# Patient Record
Sex: Female | Born: 1935 | Race: White | Hispanic: No | Smoking: Never smoker
Health system: Southern US, Community
[De-identification: ages and names within clinical notes are randomized; demographics above are authoritative.]

## PROBLEM LIST (undated history)

## (undated) DIAGNOSIS — C801 Malignant (primary) neoplasm, unspecified: Secondary | ICD-10-CM

## (undated) DIAGNOSIS — C8370 Burkitt lymphoma, unspecified site: Secondary | ICD-10-CM

## (undated) DIAGNOSIS — I639 Cerebral infarction, unspecified: Secondary | ICD-10-CM

## (undated) DIAGNOSIS — C719 Malignant neoplasm of brain, unspecified: Secondary | ICD-10-CM

## (undated) DIAGNOSIS — T7840XA Allergy, unspecified, initial encounter: Secondary | ICD-10-CM

## (undated) HISTORY — PX: ABDOMINAL HYSTERECTOMY: SHX81

## (undated) HISTORY — PX: TONSILLECTOMY: SUR1361

## (undated) HISTORY — PX: CHOLECYSTECTOMY: SHX55

## (undated) HISTORY — PX: ABDOMINAL SURGERY: SHX537

---

## 2005-08-04 ENCOUNTER — Ambulatory Visit: Payer: Self-pay | Admitting: Family Medicine

## 2005-09-23 ENCOUNTER — Ambulatory Visit: Payer: Self-pay | Admitting: Family Medicine

## 2005-11-06 ENCOUNTER — Ambulatory Visit: Payer: Self-pay | Admitting: Family Medicine

## 2005-12-28 ENCOUNTER — Ambulatory Visit: Payer: Self-pay | Admitting: Family Medicine

## 2006-01-04 ENCOUNTER — Ambulatory Visit: Payer: Self-pay | Admitting: Family Medicine

## 2015-08-18 ENCOUNTER — Encounter (HOSPITAL_COMMUNITY): Payer: Self-pay | Admitting: Emergency Medicine

## 2015-08-18 ENCOUNTER — Emergency Department (HOSPITAL_COMMUNITY): Payer: Medicare Other

## 2015-08-18 ENCOUNTER — Inpatient Hospital Stay (HOSPITAL_COMMUNITY)
Admission: EM | Admit: 2015-08-18 | Discharge: 2015-08-31 | DRG: 824 | Disposition: A | Discharge status: Home Health | Payer: Medicare Other | Attending: Internal Medicine | Admitting: Internal Medicine

## 2015-08-18 DIAGNOSIS — K219 Gastro-esophageal reflux disease without esophagitis: Secondary | ICD-10-CM | POA: Diagnosis present

## 2015-08-18 DIAGNOSIS — R1030 Lower abdominal pain, unspecified: Secondary | ICD-10-CM

## 2015-08-18 DIAGNOSIS — R079 Chest pain, unspecified: Secondary | ICD-10-CM

## 2015-08-18 DIAGNOSIS — R519 Headache, unspecified: Secondary | ICD-10-CM | POA: Insufficient documentation

## 2015-08-18 DIAGNOSIS — Z903 Acquired absence of stomach [part of]: Secondary | ICD-10-CM

## 2015-08-18 DIAGNOSIS — E875 Hyperkalemia: Secondary | ICD-10-CM | POA: Insufficient documentation

## 2015-08-18 DIAGNOSIS — R0602 Shortness of breath: Secondary | ICD-10-CM

## 2015-08-18 DIAGNOSIS — E538 Deficiency of other specified B group vitamins: Secondary | ICD-10-CM | POA: Diagnosis present

## 2015-08-18 DIAGNOSIS — D6959 Other secondary thrombocytopenia: Secondary | ICD-10-CM | POA: Diagnosis present

## 2015-08-18 DIAGNOSIS — G96198 Other disorders of meninges, not elsewhere classified: Secondary | ICD-10-CM

## 2015-08-18 DIAGNOSIS — C8590 Non-Hodgkin lymphoma, unspecified, unspecified site: Secondary | ICD-10-CM

## 2015-08-18 DIAGNOSIS — N179 Acute kidney failure, unspecified: Secondary | ICD-10-CM

## 2015-08-18 DIAGNOSIS — Z5111 Encounter for antineoplastic chemotherapy: Secondary | ICD-10-CM | POA: Insufficient documentation

## 2015-08-18 DIAGNOSIS — R634 Abnormal weight loss: Secondary | ICD-10-CM | POA: Diagnosis present

## 2015-08-18 DIAGNOSIS — G039 Meningitis, unspecified: Secondary | ICD-10-CM

## 2015-08-18 DIAGNOSIS — K5900 Constipation, unspecified: Secondary | ICD-10-CM | POA: Insufficient documentation

## 2015-08-18 DIAGNOSIS — Z87891 Personal history of nicotine dependence: Secondary | ICD-10-CM

## 2015-08-18 DIAGNOSIS — K5909 Other constipation: Secondary | ICD-10-CM

## 2015-08-18 DIAGNOSIS — Z79899 Other long term (current) drug therapy: Secondary | ICD-10-CM

## 2015-08-18 DIAGNOSIS — R200 Anesthesia of skin: Secondary | ICD-10-CM

## 2015-08-18 DIAGNOSIS — C8378 Burkitt lymphoma, lymph nodes of multiple sites: Principal | ICD-10-CM | POA: Insufficient documentation

## 2015-08-18 DIAGNOSIS — M791 Myalgia, unspecified site: Secondary | ICD-10-CM

## 2015-08-18 DIAGNOSIS — E860 Dehydration: Secondary | ICD-10-CM

## 2015-08-18 DIAGNOSIS — R61 Generalized hyperhidrosis: Secondary | ICD-10-CM | POA: Diagnosis present

## 2015-08-18 DIAGNOSIS — D6489 Other specified anemias: Secondary | ICD-10-CM | POA: Insufficient documentation

## 2015-08-18 DIAGNOSIS — Z8711 Personal history of peptic ulcer disease: Secondary | ICD-10-CM

## 2015-08-18 DIAGNOSIS — E876 Hypokalemia: Secondary | ICD-10-CM | POA: Diagnosis present

## 2015-08-18 DIAGNOSIS — D61818 Other pancytopenia: Secondary | ICD-10-CM | POA: Diagnosis present

## 2015-08-18 DIAGNOSIS — D649 Anemia, unspecified: Secondary | ICD-10-CM

## 2015-08-18 DIAGNOSIS — R531 Weakness: Secondary | ICD-10-CM

## 2015-08-18 DIAGNOSIS — R112 Nausea with vomiting, unspecified: Secondary | ICD-10-CM

## 2015-08-18 DIAGNOSIS — Z8541 Personal history of malignant neoplasm of cervix uteri: Secondary | ICD-10-CM

## 2015-08-18 DIAGNOSIS — E559 Vitamin D deficiency, unspecified: Secondary | ICD-10-CM | POA: Diagnosis present

## 2015-08-18 DIAGNOSIS — C7949 Secondary malignant neoplasm of other parts of nervous system: Secondary | ICD-10-CM

## 2015-08-18 DIAGNOSIS — Z9049 Acquired absence of other specified parts of digestive tract: Secondary | ICD-10-CM

## 2015-08-18 DIAGNOSIS — T380X5A Adverse effect of glucocorticoids and synthetic analogues, initial encounter: Secondary | ICD-10-CM | POA: Diagnosis present

## 2015-08-18 DIAGNOSIS — K1379 Other lesions of oral mucosa: Secondary | ICD-10-CM

## 2015-08-18 DIAGNOSIS — Z8719 Personal history of other diseases of the digestive system: Secondary | ICD-10-CM

## 2015-08-18 DIAGNOSIS — D696 Thrombocytopenia, unspecified: Secondary | ICD-10-CM | POA: Diagnosis present

## 2015-08-18 DIAGNOSIS — C8370 Burkitt lymphoma, unspecified site: Secondary | ICD-10-CM

## 2015-08-18 DIAGNOSIS — R51 Headache: Secondary | ICD-10-CM

## 2015-08-18 DIAGNOSIS — Z9189 Other specified personal risk factors, not elsewhere classified: Secondary | ICD-10-CM | POA: Insufficient documentation

## 2015-08-18 DIAGNOSIS — R590 Localized enlarged lymph nodes: Secondary | ICD-10-CM

## 2015-08-18 DIAGNOSIS — D801 Nonfamilial hypogammaglobulinemia: Secondary | ICD-10-CM | POA: Diagnosis present

## 2015-08-18 HISTORY — DX: Malignant (primary) neoplasm, unspecified: C80.1

## 2015-08-18 LAB — URINALYSIS, ROUTINE W REFLEX MICROSCOPIC
BILIRUBIN URINE: NEGATIVE
GLUCOSE, UA: NEGATIVE mg/dL
Ketones, ur: 15 mg/dL — AB
Leukocytes, UA: NEGATIVE
NITRITE: NEGATIVE
PH: 6 (ref 5.0–8.0)
Protein, ur: NEGATIVE mg/dL
SPECIFIC GRAVITY, URINE: 1.015 (ref 1.005–1.030)
Urobilinogen, UA: 0.2 mg/dL (ref 0.0–1.0)

## 2015-08-18 LAB — SAVE SMEAR

## 2015-08-18 LAB — SEDIMENTATION RATE: Sed Rate: 55 mm/hr — ABNORMAL HIGH (ref 0–22)

## 2015-08-18 LAB — COMPREHENSIVE METABOLIC PANEL
ALT: 16 U/L (ref 14–54)
AST: 69 U/L — AB (ref 15–41)
Albumin: 3.2 g/dL — ABNORMAL LOW (ref 3.5–5.0)
Alkaline Phosphatase: 95 U/L (ref 38–126)
Anion gap: 15 (ref 5–15)
BILIRUBIN TOTAL: 0.8 mg/dL (ref 0.3–1.2)
BUN: 13 mg/dL (ref 6–20)
CALCIUM: 9 mg/dL (ref 8.9–10.3)
CO2: 22 mmol/L (ref 22–32)
CREATININE: 1.3 mg/dL — AB (ref 0.44–1.00)
Chloride: 100 mmol/L — ABNORMAL LOW (ref 101–111)
GFR, EST AFRICAN AMERICAN: 44 mL/min — AB (ref >60)
GFR, EST NON AFRICAN AMERICAN: 38 mL/min — AB (ref >60)
Glucose, Bld: 88 mg/dL (ref 65–99)
Potassium: 3.1 mmol/L — ABNORMAL LOW (ref 3.5–5.1)
Sodium: 137 mmol/L (ref 135–145)
TOTAL PROTEIN: 5.7 g/dL — AB (ref 6.5–8.1)

## 2015-08-18 LAB — CBC WITH DIFFERENTIAL/PLATELET
BASOS PCT: 1 %
Basophils Absolute: 0.1 10*3/uL (ref 0.0–0.1)
EOS PCT: 2 %
Eosinophils Absolute: 0.1 10*3/uL (ref 0.0–0.7)
HEMATOCRIT: 38 % (ref 36.0–46.0)
Hemoglobin: 12.6 g/dL (ref 12.0–15.0)
Lymphocytes Relative: 28 %
Lymphs Abs: 2 10*3/uL (ref 0.7–4.0)
MCH: 29 pg (ref 26.0–34.0)
MCHC: 33.2 g/dL (ref 30.0–36.0)
MCV: 87.4 fL (ref 78.0–100.0)
MONO ABS: 0.8 10*3/uL (ref 0.1–1.0)
Monocytes Relative: 12 %
NEUTROS ABS: 4 10*3/uL (ref 1.7–7.7)
NEUTROS PCT: 57 %
Platelets: 34 10*3/uL — ABNORMAL LOW (ref 150–400)
RBC: 4.35 MIL/uL (ref 3.87–5.11)
RDW: 16.6 % — ABNORMAL HIGH (ref 11.5–15.5)
WBC: 7 10*3/uL (ref 4.0–10.5)

## 2015-08-18 LAB — LIPASE, BLOOD: LIPASE: 23 U/L (ref 22–51)

## 2015-08-18 LAB — MAGNESIUM: Magnesium: 1.7 mg/dL (ref 1.7–2.4)

## 2015-08-18 LAB — TYPE AND SCREEN
ABO/RH(D): O POS
ANTIBODY SCREEN: NEGATIVE

## 2015-08-18 LAB — PROTIME-INR
INR: 1.15 (ref 0.00–1.49)
Prothrombin Time: 14.8 seconds (ref 11.6–15.2)

## 2015-08-18 LAB — I-STAT TROPONIN, ED: Troponin i, poc: 0 ng/mL (ref 0.00–0.08)

## 2015-08-18 LAB — URINE MICROSCOPIC-ADD ON

## 2015-08-18 LAB — PLATELET COUNT: PLATELETS: 31 10*3/uL — AB (ref 150–400)

## 2015-08-18 LAB — APTT: aPTT: 33 seconds (ref 24–37)

## 2015-08-18 MED ORDER — SODIUM CHLORIDE 0.9 % IV SOLN
Freq: Once | INTRAVENOUS | Status: AC
  Administered 2015-08-18: via INTRAVENOUS

## 2015-08-18 MED ORDER — PANTOPRAZOLE SODIUM 40 MG PO TBEC
40.0000 mg | DELAYED_RELEASE_TABLET | Freq: Every day | ORAL | Status: DC
  Administered 2015-08-18 – 2015-08-31 (×13): 40 mg via ORAL
  Filled 2015-08-18 (×13): qty 1

## 2015-08-18 MED ORDER — SODIUM CHLORIDE 0.9 % IV BOLUS (SEPSIS)
1000.0000 mL | Freq: Once | INTRAVENOUS | Status: AC
  Administered 2015-08-18: 1000 mL via INTRAVENOUS

## 2015-08-18 MED ORDER — VITAMIN D (ERGOCALCIFEROL) 1.25 MG (50000 UNIT) PO CAPS
50000.0000 [IU] | ORAL_CAPSULE | ORAL | Status: DC
  Administered 2015-08-27: 50000 [IU] via ORAL
  Filled 2015-08-18 (×2): qty 1

## 2015-08-18 MED ORDER — PNEUMOCOCCAL VAC POLYVALENT 25 MCG/0.5ML IJ INJ
0.5000 mL | INJECTION | INTRAMUSCULAR | Status: AC
  Administered 2015-08-20: 0.5 mL via INTRAMUSCULAR
  Filled 2015-08-18: qty 0.5

## 2015-08-18 MED ORDER — IOHEXOL 300 MG/ML  SOLN
25.0000 mL | Freq: Once | INTRAMUSCULAR | Status: DC | PRN
  Administered 2015-08-18: 25 mL via ORAL
  Filled 2015-08-18: qty 30

## 2015-08-18 MED ORDER — INFLUENZA VAC SPLIT QUAD 0.5 ML IM SUSY
0.5000 mL | PREFILLED_SYRINGE | INTRAMUSCULAR | Status: AC
  Administered 2015-08-20: 0.5 mL via INTRAMUSCULAR
  Filled 2015-08-18: qty 0.5

## 2015-08-18 MED ORDER — SODIUM CHLORIDE 0.9 % IV SOLN
INTRAVENOUS | Status: AC
  Administered 2015-08-18: 22:00:00 via INTRAVENOUS

## 2015-08-18 MED ORDER — IOHEXOL 300 MG/ML  SOLN
100.0000 mL | Freq: Once | INTRAMUSCULAR | Status: AC | PRN
  Administered 2015-08-18: 100 mL via INTRAVENOUS

## 2015-08-18 MED ORDER — POTASSIUM CHLORIDE CRYS ER 20 MEQ PO TBCR
60.0000 meq | EXTENDED_RELEASE_TABLET | Freq: Once | ORAL | Status: AC
  Administered 2015-08-18: 60 meq via ORAL
  Filled 2015-08-18: qty 3

## 2015-08-18 MED ORDER — TRAMADOL HCL 50 MG PO TABS
50.0000 mg | ORAL_TABLET | Freq: Four times a day (QID) | ORAL | Status: DC | PRN
  Administered 2015-08-19 – 2015-08-20 (×2): 50 mg via ORAL
  Filled 2015-08-18 (×2): qty 1

## 2015-08-18 MED ORDER — ONDANSETRON HCL 4 MG/2ML IJ SOLN
4.0000 mg | Freq: Once | INTRAMUSCULAR | Status: AC
  Administered 2015-08-18: 4 mg via INTRAVENOUS
  Filled 2015-08-18: qty 2

## 2015-08-18 NOTE — ED Provider Notes (Signed)
Medical screening examination/treatment/procedure(s) were conducted as a shared visit with non-physician practitioner(s) and myself.  I personally evaluated the patient during the encounter.  1 month of generalized weakness, weight loss and chin numbness. Multiple other symptoms on ROS. Exam benign. Abdomen benign. Lungs normal. Heart normal. VSS. 2/2 weight loss, anorexia, fatigue and intermittent abdominal pain we will do a CT scan of abdomen/pelvis to eval for malignancy, obstruction or diverticulitis. If normal can be dc.    EKG Interpretation   Date/Time:  Sunday August 18 2015 15:19:55 EDT Ventricular Rate:  68 PR Interval:  168 QRS Duration: 80 QT Interval:  439 QTC Calculation: 467 R Axis:   -5 Text Interpretation:  Sinus rhythm Probable anteroseptal infarct, old No  previous ECGs available Confirmed by [facility] MD, Denee Boeder [phone]) on 08/18/2015  5:00:08 PM        Merrily Pew, MD 08/21/15 2230

## 2015-08-18 NOTE — ED Provider Notes (Signed)
CSN: [id number]     Arrival date & time 08/18/15  1329 History   First MD Initiated Contact with Patient 08/18/15 1348     Chief Complaint  Patient presents with  . Generalized Body Aches  . Numbness     (Consider location/radiation/quality/duration/timing/severity/associated sxs/prior Treatment) HPI Comments: Kristen Baker is a 81 y.o. female with a PMHx of remote cervical cancer in her 61s, GERD, and vitamin D deficiency, with a PSHx of cholecystectomy, tonsillectomy, abd hysterectomy, and colonic resection, who presents to the ED with multiple complaints. She reports that for the last month she has been having intermittent generalized body aches, lower abdominal pain, nausea, vomiting, headaches, chest pain, and shortness breath with exertion, and constant numbness in her chin. She describes her lower abdominal pain is 10/10 intermittent sharp shooting pain from her lower abdomen radiating to her back with no known aggravating factors and alleviated with Aleve. She took Aleve just prior to arrival. She states that currently her abdominal pain is resolved as well as her headaches, myalgias, and CP/SOB. She reports that she has some nausea which is ongoing, and the last time she vomited was on Friday and this was nonbloody nonbilious. The only symptom currently present is the nausea and the constant numbness in her chin which has not changed since onset. She reports that all of her symptoms seem to start after she had an iron infusion in August. She reports that her primary care doctor has seen her for these symptoms and she had B-12 shots in the past for it.   She denies any medical history aside from GERD and vitamin D deficiency as well as the remote cervical cancer history. She denies any fevers, chills, shortness of breath at rest, cough, leg swelling, recent travel/surgery/immobilization, history of DVT/PE, estrogen use, diarrhea, constipation, melena, hematochezia, obstipation, hematemesis,  dysuria, hematuria, weakness, vision changes, syncope, lightheadedness, or diaphoresis. She is a nonsmoker.  PCP is Dr. Cathi Roan at [facility] in [place].  Patient is a 81 y.o. female presenting with abdominal pain. The history is provided by the patient. No language interpreter was used.  Abdominal Pain Pain location:  LLQ and RLQ Pain quality: sharp   Pain radiates to:  Back Pain severity:  Severe Onset quality:  Gradual Duration:  4 weeks Timing:  Intermittent Progression:  Partially resolved Chronicity:  New Context: not recent travel, not sick contacts and not suspicious food intake   Relieved by:  NSAIDs Worsened by:  Nothing tried Ineffective treatments:  None tried Associated symptoms: chest pain (intermittently, now resolved), nausea, shortness of breath (only with exertion, currently resolved) and vomiting (last episode on Friday)   Associated symptoms: no chills, no constipation, no cough, no diarrhea, no dysuria, no fever, no flatus, no hematemesis, no hematochezia, no hematuria and no melena   Risk factors: NSAID use     Past Medical History  Diagnosis Date  . Cancer [facility])     cervical cancer   Past Surgical History  Procedure Laterality Date  . Cholecystectomy    . Tonsillectomy    . Abdominal hysterectomy    . Abdominal surgery      2-3 rd of colon removed   No family history on file. Social History  Substance Use Topics  . Smoking status: Never Smoker   . Smokeless tobacco: None  . Alcohol Use: No   OB History    No data available     Review of Systems  Constitutional: Negative for fever, chills and diaphoresis.  Eyes: Negative for visual disturbance.  Respiratory: Positive for shortness of breath (only with exertion, currently resolved). Negative for cough.   Cardiovascular: Positive for chest pain (intermittently, now resolved). Negative for leg swelling.  Gastrointestinal: Positive for nausea, vomiting (last episode on Friday) and  abdominal pain (intermittent). Negative for diarrhea, constipation, blood in stool, melena, hematochezia, flatus and hematemesis.  Genitourinary: Negative for dysuria and hematuria.  Musculoskeletal: Positive for myalgias (generalized body aches). Negative for arthralgias.  Skin: Negative for color change and rash.  Allergic/Immunologic: Negative for immunocompromised state.  Neurological: Positive for numbness (to chin, constantly) and headaches (intermittent, currently resolved). Negative for weakness and light-headedness.  Hematological: Does not bruise/bleed easily.  Psychiatric/Behavioral: Negative for confusion.   10 Systems reviewed and are negative for acute change except as noted in the HPI.    Allergies  Sulfa antibiotics  Home Medications   Prior to Admission medications   Not on File   BP 155/60 mmHg  Pulse 80  Temp(Src) 98.1 F (36.7 C) (Oral)  Resp 16  Ht 5\' 6"  (1.676 m)  Wt 174 lb 14.4 oz (79.334 kg)  BMI 28.24 kg/m2  SpO2 95% Physical Exam  Constitutional: She is oriented to person, place, and time. Vital signs are normal. She appears well-developed and well-nourished.  Non-toxic appearance. No distress.  Afebrile, nontoxic, NAD  HENT:  Head: Normocephalic and atraumatic.  Mouth/Throat: Oropharynx is clear and moist and mucous membranes are normal.  Eyes: Conjunctivae and EOM are normal. Pupils are equal, round, and reactive to light. Right eye exhibits no discharge. Left eye exhibits no discharge.  PERRL, EOMI (chronic baseline lateral deviation of R eye), no nystagmus, no visual field deficits   Neck: Normal range of motion. Neck supple.  Cardiovascular: Normal rate, regular rhythm, normal heart sounds and intact distal pulses.  Exam reveals no gallop and no friction rub.   No murmur heard. RRR, nl s1/s2, no m/r/g, distal pulses intact, no pedal edema   Pulmonary/Chest: Effort normal and breath sounds normal. No respiratory distress. She has no decreased  breath sounds. She has no wheezes. She has no rhonchi. She has no rales. She exhibits no tenderness, no crepitus, no deformity and no retraction.  CTAB in all lung fields, no w/r/r, no hypoxia or increased WOB, speaking in full sentences, SpO2 95% on RA No chest wall TTP, crepitus, deformity, or retractions.  Abdominal: Soft. Normal appearance and bowel sounds are normal. She exhibits no distension. There is tenderness in the suprapubic area. There is no rigidity, no rebound, no guarding, no CVA tenderness, no tenderness at McBurney's point and negative Murphy's sign.    Soft, nondistended, +BS throughout, with mild TTP only with very deep palpation to the suprapubic area, no r/g/r, neg murphy's, neg mcburney's, no CVA TTP   Musculoskeletal: Normal range of motion.  MAE x4 Strength and sensation grossly intact Distal pulses intact No pedal edema, neg homan's bilaterally   Neurological: She is alert and oriented to person, place, and time. She has normal strength. A sensory deficit (somewhat diminished sensation to chin, no other deficits) is present. No cranial nerve deficit. Coordination and gait normal. GCS eye subscore is 4. GCS verbal subscore is 5. GCS motor subscore is 6.  CN 2-12 grossly intact aside from mildly diminished sensation to chin, no facial asymmetry A&O x4 GCS 15 Sensation and strength intact elsewhere Gait nonataxic Coordination with finger-to-nose WNL Neg pronator drift   Skin: Skin is warm, dry and intact. No rash noted.  Psychiatric:  She has a normal mood and affect.  Nursing note and vitals reviewed.   ED Course  Procedures (including critical care time) Labs Review Labs Reviewed  URINALYSIS, ROUTINE W REFLEX MICROSCOPIC (NOT AT [facility]) - Abnormal; Notable for the following:    Hgb urine dipstick TRACE (*)    Ketones, ur 15 (*)    All other components within normal limits  CBC WITH DIFFERENTIAL/PLATELET - Abnormal; Notable for the following:    RDW 16.6 (*)     Platelets 34 (*)    All other components within normal limits  COMPREHENSIVE METABOLIC PANEL - Abnormal; Notable for the following:    Potassium 3.1 (*)    Chloride 100 (*)    Creatinine, Ser 1.30 (*)    Total Protein 5.7 (*)    Albumin 3.2 (*)    AST 69 (*)    GFR calc non Af Amer 38 (*)    GFR calc Af Amer 44 (*)    All other components within normal limits  PLATELET COUNT - Abnormal; Notable for the following:    Platelets 31 (*)    All other components within normal limits  LIPASE, BLOOD  URINE MICROSCOPIC-ADD ON  PROTIME-INR  APTT  I-STAT TROPOININ, ED    Imaging Review Ct Abdomen Pelvis W Contrast  08/18/2015   CLINICAL DATA:  81 year old female with lower abdominal pain nausea and vomiting for 1 month. Initial encounter.  EXAM: CT ABDOMEN AND PELVIS WITH CONTRAST  TECHNIQUE: Multidetector CT imaging of the abdomen and pelvis was performed using the standard protocol following bolus administration of intravenous contrast.  CONTRAST:  60mL OMNIPAQUE IOHEXOL 300 MG/ML SOLN, 139mL OMNIPAQUE IOHEXOL 300 MG/ML SOLN  COMPARISON:  [facility] Chest CTA 11/20/2012. Acute abdominal series from today  FINDINGS: Mild respiratory motion artifact at the lung bases. There are chronic surgical clips about the distal thoracic esophagus and gastroesophageal junction. New since 2014 in the posterior mediastinum and tracking toward the retrocrural space is abnormal confluent soft tissue anterior to the thoracic spine and inseparable from the medial wall of the descending thoracic aorta measuring 3 x 44 x 54 mm (AP by transverse by CC). This seems to be separate from both the aorta and esophagus. There is no anterior thoracic spine erosion. Calcified plaque along this segment of the aorta.  No associated pericardial or pleural effusion. Mild bronchiectasis at both lung bases with no confluent pulmonary opacity.  Degenerative changes in the spine. Chronic 10 mm lucent area in the left T12 vertebral  body is stable and most resembled hemangioma in 2014. No acute or suspicious osseous lesion is identified however, there is subtle increased ventral epidural soft tissue seen in the sacrum -sagittal image 73.  Mild to moderate nonspecific presacral stranding. No pelvic free fluid. Negative rectum with retained stool. Uterus surgically absent. Adnexa within normal limits. Numerous pelvic phleboliths. Negative urinary bladder.  Redundant sigmoid colon. Proximal sigmoid and left colon diverticulosis with no active inflammation identified. Negative transverse colon. Negative right colon. Ileocecal valve lipoma incidentally noted. Appendix diminutive or absent. Negative terminal ileum. No dilated or abnormal small bowel loops. Occasional surgical clips in the greater omentum. Diminutive stomach. Duodenum within normal limits.  Major arterial structures are patent in the abdomen and pelvis with fairly extensive calcified aortic atherosclerosis. Portal venous system appears to be patent.  Retroperitoneal lymphadenopathy maximal at the lower lumbar spine level anterior to the IVC measuring up to 22 mm short axis. Numerous increased para renal and other retroperitoneal space  nodes are individually up to 14 mm short axis. Mesenteric nodes in the abdomen and pelvis have a more normal appearance. No pelvic sidewall or inguinal lymphadenopathy identified.  Solitary small nonspecific low-density area in the right hepatic lobe measuring 10 mm on series 2, image 20, favor benign. Gallbladder not identified and felt to be surgically absent. No splenomegaly or splenic lesion. Negative pancreas and adrenal glands. Bilateral renal enhancement and contrast excretion within normal limits. No abdominal free fluid.  IMPRESSION: 1. Retroperitoneal and lower posterior mediastinal / retrocrural soft tissue masses most compatible with lymphadenopathy, and most suggestive of Lymphoma. Some of these might be amenable to CT-guided biopsy,  uncertain. 2. Subtle increased sacral epidural soft tissue, but no destructive osseous lesion identified. Nonspecific presacral stranding. Metastatic disease to the spine not excluded.   Electronically Signed   By: Genevie Ann M.D.   On: 08/18/2015 18:52   Dg Abd Acute W/chest  08/18/2015   CLINICAL DATA:  Chest pain and abdominal pain.  EXAM: DG ABDOMEN ACUTE W/ 1V CHEST  COMPARISON:  Chest x-ray dated 12/12/2012 performed at [place]: Heart size and pulmonary vascularity are normal and the lungs are clear. Surgical clips at the gastroesophageal junction. Slight thoracolumbar scoliosis.  No free air or free fluid in the abdomen. Bowel gas pattern is normal. Surgical clips and staples in the abdomen. Multiple phleboliths in the pelvis. No acute osseous abnormality.  IMPRESSION: Negative abdominal radiographs.  No acute cardiopulmonary disease.   Electronically Signed   By: Lorriane Shire M.D.   On: 08/18/2015 17:17   I have personally reviewed and evaluated these images and lab results as part of my medical decision-making.   EKG Interpretation   Date/Time:  Sunday August 18 2015 15:19:55 EDT Ventricular Rate:  68 PR Interval:  168 QRS Duration: 80 QT Interval:  439 QTC Calculation: 467 R Axis:   -5 Text Interpretation:  Sinus rhythm Probable anteroseptal infarct, old No  previous ECGs available Confirmed by [facility] MD, Corene Cornea [phone]) on 08/18/2015  5:00:08 PM      MDM   Final diagnoses:  Myalgia  Non-intractable vomiting with nausea, vomiting of unspecified type  Lower abdominal pain  Numbness of face  SOB (shortness of breath) on exertion  Intermittent chest pain  Frequent headaches  Thrombocytopenia (HCC)  Hypokalemia  LAD (lymphadenopathy), retroperitoneal    81 y.o. female here with intermittent abd pain, n/v, headaches, CP, SOB with exertion, and constant numbness in chin x1 month. Has seen her PCP who worked her up, but nothing in the chart regarding this. No  baseline labs on file. On exam, mild suprapubic tenderness with very deep palpation, nonperitoneal, clear lung exam, no chest wall TTP, no hypoxia or tachycardia, no pedal edema, no focal neuro deficits aside from diminished sensation to chin. No rashes. Difficult to determine exact etiology of symptoms, will get basic labs and acute abd series to start, and after we have Cr value could consider abd CT scan to eval her abd pain. Will give zofran and fluids, pt declines pain meds stating she just took aleve PTA and "has no pain right now". Will reassess shortly.   5:51 PM U/A clear, trop neg, EKG without concerning acute ischemic changes, CBC w/diff showing plt 34. Pt states she doesn't remember anybody ever telling her she had a low plt count. Morphology on CBC also shows atypical lymphocytes. CMP with mildly low K, will replete here. Also shows Cr 1.3, borderline but I feel we could  proceed with CT abd/pelvis to evaluate her low abd pain further. AST 69, pt denies alcohol use, unclear if this is new or not. Lipase WNL. Acute abd series unremarkable. Given low plt count, will recheck to verify the accuracy, and will get aptt and INR. Will also get the CT abd/pelvis. Pt denies pain anywhere at this time, feels less nauseated. Will reassess shortly.   7:07 PM Repeat plt count 31, aPTT and INR WNL. CT abd/pelvis revealing retroperitoneal/lower posterior mediastinal/retrocrural LAD concerning for lymphoma and subtle increased sacral epidural soft tissue that could represent metastatic disease. Given these findings, will consult oncology to discuss case.  7:20 PM Dr. Irene Limbo of oncology returning page, would like to have her admitted so biopsies can be planned for tomorrow and platelets can be given at that time, states it would be easier to coordinate inpatient than outpt. He will see her in the morning. Will consult unassigned for admission.   7:37 PM Dr. Genene Churn of IM residency returning page, will admit. Temp  admit orders in. Please see his notes for further documentation of care.  BP 139/67 mmHg  Pulse 69  Temp(Src) 98.1 F (36.7 C) (Oral)  Resp 19  Ht 5\' 6"  (1.676 m)  Wt 174 lb 14.4 oz (79.334 kg)  BMI 28.24 kg/m2  SpO2 91%  Meds ordered this encounter  Medications  . sodium chloride 0.9 % bolus 1,000 mL    Sig:   . ondansetron (ZOFRAN) injection 4 mg    Sig:   . potassium chloride SA (K-DUR,KLOR-CON) CR tablet 60 mEq    Sig:   . iohexol (OMNIPAQUE) 300 MG/ML solution 100 mL    Sig:   . iohexol (OMNIPAQUE) 300 MG/ML solution 25 mL    Sig:      Pilar Corrales Camprubi-Soms, PA-C 08/18/15 1938  Merrily Pew, MD 08/21/15 2230

## 2015-08-18 NOTE — ED Notes (Signed)
Pt off unit with CT 

## 2015-08-18 NOTE — Progress Notes (Signed)
Report received from [place], [place] from ED. Pt is to be admitted in 5W11. Awaiting pt's arrival.

## 2015-08-18 NOTE — ED Notes (Signed)
Pt c/o body ache and numbness to chin x 1 month. Pt has been seen by PMD for same and had a work up done. Pt has had iron infusions and B12 shots.

## 2015-08-18 NOTE — H&P (Signed)
Date: 08/18/2015               Patient Name:  Kristen Baker MRN: [id number]  DOB: [birth year] Age / Sex: 81 y.o., female   PCP: No primary care provider on file.         Medical Service: Internal Medicine Teaching Service         Attending Physician: Dr. Michel Bickers, MD    First Contact: Dr. Lindon Romp Pager: [phone]  Second Contact: Dr. Michail Jewels Pager: [phone]       After Hours (After 5p/  First Contact Pager: [phone]  weekends / holidays): Second Contact Pager: [phone]   Chief Complaint: "I've been nauseous, having night sweats, losing my appetite, and my lower belly has been killing me for the last month."  History of Present Illness: [name] is a pleasant 81 year old lady a history of cervical cancer in the 1970s status-post hysterectomy and gastroesophageal reflux disease status-post Nissen fundoplication who presents with a one month history of progressive nausea, vomiting, night sweats, weight loss, lower abdominal pain, headache, chin numbness, and easy brusing. She thought her symptoms were from an iron infusion she got back in August, but they've been getting significantly worse in the last two weeks so she decided to come into the hospital. Her abdominal pain is sharp and band-like, involving her entire lower abdomen and back. This pain comes and goes spontaneously and is not related to position, bowel movements, and not associated with dysuria. Her back does not hurt worse when she presses on her spine. Her headache feels like "lightning bolts" and involves her entire head; this also comes and goes spontaneously. She has some progressive chin numbness which she has never felt before, but she does not have any focal weakness in any other extremities, and feels like she can walk around just fine. She has been getting B12 injections regularly; her last shot was about 3 weeks ago. She denies feeling any new lymph nodes.  Regarding her cancer screening, her last mammogram  was a few weeks ago, which was normal, and her last colonoscopy was 3-4 years ago. She had cervical cancer back in the 70s which she had surgery. She's also had a Nissen fundoplication for GERD, a cholecystectomy, and a tonsillectomy. She smoked for about 15 years in the pack and quit 50n years ago.   She lives at home alone and is very independent. She cooks, cleans, and drives herself. Her son and daughter both live about a mile away and are there to help her whenever she needs it.  In the emergency department, her vital signs were stable. Labs were notable for platelet count of 34, and a CT of her abdomen and pelvis showed retroperitoneal and lower posterior mediastinal lymphadenopathy suggestive of lymphoma, and a subtle increased sacral epidural soft tissue that cannot be excluded as a spinal metastasis.  Meds: Current Facility-Administered Medications  Medication Dose Route Frequency Provider Last Rate Last Dose  . iohexol (OMNIPAQUE) 300 MG/ML solution 25 mL  25 mL Oral Once PRN Medication Radiologist, MD   25 mL at 08/18/15 1819   Current Outpatient Prescriptions  Medication Sig Dispense Refill  . DimenhyDRINATE (MOTION SICKNESS PO) Take 1 tablet by mouth daily as needed. For motion sickness per patient    . naproxen sodium (ANAPROX) 220 MG tablet Take 220 mg by mouth daily as needed. For pain    . omeprazole (PRILOSEC) 40 MG capsule Take 40 mg by mouth daily.    [name] Kitchen  Vitamin D, Ergocalciferol, (DRISDOL) 50000 UNITS CAPS capsule Take 50,000 Units by mouth every 7 (seven) days. Take on Tuesdays      Allergies: Allergies as of 08/18/2015 - Review Complete 08/18/2015  Allergen Reaction Noted  . Cabbage  08/18/2015  . Onion  08/18/2015  . Shellfish allergy  08/18/2015  . Sulfa antibiotics  08/18/2015   Past Medical History  Diagnosis Date  . Cancer [facility])     cervical cancer   Past Surgical History  Procedure Laterality Date  . Cholecystectomy    . Tonsillectomy    . Abdominal  hysterectomy    . Abdominal surgery      2-3 rd of colon removed   No family history on file. Social History   Social History  . Marital Status: Married    Spouse Name: N/A  . Number of Children: N/A  . Years of Education: N/A   Occupational History  . Not on file.   Social History Main Topics  . Smoking status: Never Smoker   . Smokeless tobacco: Not on file  . Alcohol Use: No  . Drug Use: No  . Sexual Activity: Not on file   Other Topics Concern  . Not on file   Social History Narrative  . No narrative on file    Review of Systems  Constitutional: Positive for fever, chills, weight loss and malaise/fatigue. Negative for diaphoresis.  HENT: Negative for hearing loss and sore throat.   Eyes: Negative for blurred vision and double vision.  Respiratory: Negative for cough and shortness of breath.   Cardiovascular: Negative for chest pain, palpitations and leg swelling.  Gastrointestinal: Positive for nausea, vomiting and abdominal pain. Negative for heartburn, diarrhea, constipation, blood in stool and melena.  Genitourinary: Negative for dysuria and urgency.  Musculoskeletal: Positive for myalgias and back pain.  Skin: Negative for rash.       Easy bruising  Neurological: Positive for dizziness, sensory change, weakness and headaches. Negative for speech change, focal weakness and loss of consciousness.    Physical Exam: Blood pressure 148/64, pulse 66, temperature 98.1 F (36.7 C), temperature source Oral, resp. rate 19, height 5\' 6"  (1.676 m), weight 79.334 kg (174 lb 14.4 oz), SpO2 91 %.   General: elderly white lady lying comfortably in bed, in no pain HEENT: subtle right eye exotropia, but extraocular movements are normal, without scleral icterus Cardiac: RRR with 2/6 early systolic murmur, no rubs or gallops Pulm: clear to auscultation bilaterally, moving normal volumes of air Abd: soft, nontender to palpation, no organomegaly, nondistended, BS present Ext:  warm and well perfused, no pedal edema MSK: moving all extremities normally, without spinal tenderness to palpation Lymph: no cervical, axillary, or inguinal lymphadenopathy Neuro: alert and oriented X3, besides numbness to her entire chin bilaterally to light touch, her cranial nerves II-XII grossly intact, strength 5/5 throughout, sensation 5/5 throughout besides her chin  Lab results: Basic Metabolic Panel:  Recent Labs  08/18/15 1408  NA 137  K 3.1*  CL 100*  CO2 22  GLUCOSE 88  BUN 13  CREATININE 1.30*  CALCIUM 9.0   Liver Function Tests:  Recent Labs  08/18/15 1408  AST 69*  ALT 16  ALKPHOS 95  BILITOT 0.8  PROT 5.7*  ALBUMIN 3.2*    Recent Labs  08/18/15 1408  LIPASE 23   CBC:  Recent Labs  08/18/15 1408 08/18/15 1824  WBC 7.0  --   NEUTROABS 4.0  --   HGB 12.6  --  HCT 38.0  --   MCV 87.4  --   PLT 34* 31*   Coagulation:  Recent Labs  08/18/15 1824  LABPROT 14.8  INR 1.15   Urinalysis:  Recent Labs  08/18/15 1600  COLORURINE YELLOW  LABSPEC 1.015  PHURINE 6.0  GLUCOSEU NEGATIVE  HGBUR TRACE*  BILIRUBINUR NEGATIVE  KETONESUR 15*  PROTEINUR NEGATIVE  UROBILINOGEN 0.2  NITRITE NEGATIVE  LEUKOCYTESUR NEGATIVE   Imaging results:  Ct Abdomen Pelvis W Contrast  08/18/2015   CLINICAL DATA:  81 year old female with lower abdominal pain nausea and vomiting for 1 month. Initial encounter.  EXAM: CT ABDOMEN AND PELVIS WITH CONTRAST  TECHNIQUE: Multidetector CT imaging of the abdomen and pelvis was performed using the standard protocol following bolus administration of intravenous contrast.  CONTRAST:  36mL OMNIPAQUE IOHEXOL 300 MG/ML SOLN, 154mL OMNIPAQUE IOHEXOL 300 MG/ML SOLN  COMPARISON:  [facility] Chest CTA 11/20/2012. Acute abdominal series from today  FINDINGS: Mild respiratory motion artifact at the lung bases. There are chronic surgical clips about the distal thoracic esophagus and gastroesophageal junction. New since 2014 in  the posterior mediastinum and tracking toward the retrocrural space is abnormal confluent soft tissue anterior to the thoracic spine and inseparable from the medial wall of the descending thoracic aorta measuring 3 x 44 x 54 mm (AP by transverse by CC). This seems to be separate from both the aorta and esophagus. There is no anterior thoracic spine erosion. Calcified plaque along this segment of the aorta.  No associated pericardial or pleural effusion. Mild bronchiectasis at both lung bases with no confluent pulmonary opacity.  Degenerative changes in the spine. Chronic 10 mm lucent area in the left T12 vertebral body is stable and most resembled hemangioma in 2014. No acute or suspicious osseous lesion is identified however, there is subtle increased ventral epidural soft tissue seen in the sacrum -sagittal image 73.  Mild to moderate nonspecific presacral stranding. No pelvic free fluid. Negative rectum with retained stool. Uterus surgically absent. Adnexa within normal limits. Numerous pelvic phleboliths. Negative urinary bladder.  Redundant sigmoid colon. Proximal sigmoid and left colon diverticulosis with no active inflammation identified. Negative transverse colon. Negative right colon. Ileocecal valve lipoma incidentally noted. Appendix diminutive or absent. Negative terminal ileum. No dilated or abnormal small bowel loops. Occasional surgical clips in the greater omentum. Diminutive stomach. Duodenum within normal limits.  Major arterial structures are patent in the abdomen and pelvis with fairly extensive calcified aortic atherosclerosis. Portal venous system appears to be patent.  Retroperitoneal lymphadenopathy maximal at the lower lumbar spine level anterior to the IVC measuring up to 22 mm short axis. Numerous increased para renal and other retroperitoneal space nodes are individually up to 14 mm short axis. Mesenteric nodes in the abdomen and pelvis have a more normal appearance. No pelvic sidewall or  inguinal lymphadenopathy identified.  Solitary small nonspecific low-density area in the right hepatic lobe measuring 10 mm on series 2, image 20, favor benign. Gallbladder not identified and felt to be surgically absent. No splenomegaly or splenic lesion. Negative pancreas and adrenal glands. Bilateral renal enhancement and contrast excretion within normal limits. No abdominal free fluid.  IMPRESSION: 1. Retroperitoneal and lower posterior mediastinal / retrocrural soft tissue masses most compatible with lymphadenopathy, and most suggestive of Lymphoma. Some of these might be amenable to CT-guided biopsy, uncertain. 2. Subtle increased sacral epidural soft tissue, but no destructive osseous lesion identified. Nonspecific presacral stranding. Metastatic disease to the spine not excluded.   Electronically Signed  By: Genevie Ann M.D.   On: 08/18/2015 18:52   Dg Abd Acute W/chest  08/18/2015   CLINICAL DATA:  Chest pain and abdominal pain.  EXAM: DG ABDOMEN ACUTE W/ 1V CHEST  COMPARISON:  Chest x-ray dated 12/12/2012 performed at [facility]: Heart size and pulmonary vascularity are normal and the lungs are clear. Surgical clips at the gastroesophageal junction. Slight thoracolumbar scoliosis.  No free air or free fluid in the abdomen. Bowel gas pattern is normal. Surgical clips and staples in the abdomen. Multiple phleboliths in the pelvis. No acute osseous abnormality.  IMPRESSION: Negative abdominal radiographs.  No acute cardiopulmonary disease.   Electronically Signed   By: Lorriane Shire M.D.   On: 08/18/2015 17:17    Other results: EKG: first degree AV block, but otherwise normal without ST changes; there are no previous tracings available for comparison  Assessment & Plan by Problem: Ms. Windholz is a pleasant 81 year old lady presenting with a one-month history of B-symptoms, lower band-like abdominal pain, headaches, and chin numbness, found to have severe thrombocytopenia and  retroperitoneal lymphadenopathy concerning for lymphoma and possibly a spinal metastasis. She does not have lymphadenopathy on exam. If this is not truly blood-borne non-Hodgkin lymphoma, another consideration is gastric lymphoma given her history of gastroesophageal reflux disease. However, she had a Nissen fundoplication which makes this less likely. We don't know if she had H. Pylori serology in the past which is associated with gastric lymphoma. Primary CNS lymphoma is another consideration given her headache and chin-numbness, although her neurologic exam is otherwise normal. We will defer further evaluation until the biopsy results come back and oncology weighs in on their opinion.   Her isolated thrombocytopenia is perplexing to me because her other blood counts are normal. My top two considerations are NSAID-induced thrombocytopenia and lymphoma, although NSAIDs very rarely cause isolated thrombocytopenia and I don't think this is from bone marrow infiltration as her other blood counts are normal. She did have anemia back in August which normalized after getting an iron infusion, so she likely had true iron-deficiency anemia. Her last colonoscopy was normal 4 years ago. I also considered thrombocytic microangiopathy such as HUS or TTP, given her neurologic findings and renal insufficiency. But again, she is not anemic after getting an iron infusion and I'd expect her to still be anemic if she was actively hemolyzing. I do not think she has DIC as her PT/PTT are normal. We will also order HIV, EBV, and hepatitis C levels to evaluate for other potential causes of her thrombocytopenia. She is not bleeding at the moment, but she has been bruising easily. We will transfuse her with 1 U platelets to ensure she can undergo biopsy tomorrow.  Regarding her renal dysfunction, it's hard to say whether this is new because we don't have a prior creatinine. She's likely dehydrating from her vomiting and she has been  taking quite a bit of NSAIDs for her pain. Per above, another consideration is a thrombocytopenic microangiopathy. We'll obtain prior records tomorrow and give fluids in the meantime.  Retroperitoneal lymphadenopathy: Per above, the imaging suggests lymphoma. Biopsy will provide more information tomorrow and will direct work-up moving forward. -Thank you Oncology -Biopsy tomorrow with IR -Peripheral smear -LDH -EBV  Isolated thrombocytopenia: Per above, differential includes malignancy, thrombocytic microangiopathy, NSAID-induced, and idiopathic thrombocytopenic purpura.  -Will give 1 U platelets now -Peripheral smear to evaluate for schistocytes -CBC tomorrow -EBV -Hepatitis labs -HIV -HTLV  Abdominal pain and nausea: Per  above, likely related to her lymphadenopathy. Urinalysis was clean and she has been having normal bowel movements. I considered nephrolithiasis as another cause but she did not have hematuria. We will hold NSAIDs for now until we determine the cause of her acute kidney injury. -Tramadol as needed -Ondansetron as needed  Headache: May be secondary to dehydration or brain malignancy. Despite her thrombocytopenia I do not think she has a bleed as this headache comes and goes. -Tramadol as needed  Chin numbness: I'm not sure what is causing this. It may be secondary to brain malignancy? But the distribution is not typical of trigeminal involvement and her neurologic exam is otherwise normal. Her calcium level was also normal.  Renal insufficiency: Per above, differential includes dehydration, NSAID use, or microangiopathy. -Obtain old records -NS at 125cc/hr  Gastroesophageal reflux disease: Not an acute issue. -Continue omeprazole  Hypokalemia: Likely from vomiting. -Replaced with 77meq PO potassium  Dispo: Disposition is deferred at this time, awaiting improvement of current medical problems.  The patient does not have a current PCP (No primary care provider on  file.) and does need an [facility] hospital follow-up appointment after discharge.  The patient does not know have transportation limitations that hinder transportation to clinic appointments.  Signed: Loleta Chance, MD 08/18/2015, 8:47 PM

## 2015-08-18 NOTE — Progress Notes (Signed)
NURSING PROGRESS NOTE  Kristen Baker [id number] Admission Data: 08/18/2015 9:22 PM Attending Provider: Michel Bickers, MD PCP:No primary care provider on file. Code Status: Full   Kristen Baker is a 81 y.o. female patient admitted from ED:  -No acute distress noted.  -No complaints of shortness of breath.  -No complaints of chest pain.   Cardiac Monitoring: Box # 13 in place. Cardiac monitor yields:normal sinus rhythm.  Blood pressure 144/58, pulse 71, temperature 98.2 F (36.8 C), temperature source Oral, resp. rate 18, height 5' 7.2" (1.707 m), weight 79.833 kg (176 lb), SpO2 96 %.   IV Fluids:  IV in place, occlusive dsg intact without redness, IV cath hand right, condition patent and no redness none.   Allergies:  Cabbage; Onion; Shellfish allergy; and Sulfa antibiotics  Past Medical History:   has a past medical history of Cancer ([facility]).  Past Surgical History:   has past surgical history that includes Cholecystectomy; Tonsillectomy; Abdominal hysterectomy; and Abdominal surgery.  Social History:   reports that she has never smoked. She does not have any smokeless tobacco history on file. She reports that she does not drink alcohol or use illicit drugs.  Skin: Intact  Patient/Family orientated to room. Information packet given to patient/family. Admission inpatient armband information verified with patient/family to include name and date of birth and placed on patient arm. Side rails up x 2, fall assessment and education completed with patient/family. Patient/family able to verbalize understanding of risk associated with falls and verbalized understanding to call for assistance before getting out of bed. Call light within reach. Patient/family able to voice and demonstrate understanding of unit orientation instructions.

## 2015-08-19 ENCOUNTER — Observation Stay (HOSPITAL_COMMUNITY): Payer: Medicare Other

## 2015-08-19 DIAGNOSIS — R1032 Left lower quadrant pain: Secondary | ICD-10-CM | POA: Diagnosis not present

## 2015-08-19 DIAGNOSIS — R110 Nausea: Secondary | ICD-10-CM

## 2015-08-19 DIAGNOSIS — R531 Weakness: Secondary | ICD-10-CM | POA: Diagnosis not present

## 2015-08-19 DIAGNOSIS — D696 Thrombocytopenia, unspecified: Secondary | ICD-10-CM

## 2015-08-19 DIAGNOSIS — K219 Gastro-esophageal reflux disease without esophagitis: Secondary | ICD-10-CM

## 2015-08-19 DIAGNOSIS — R51 Headache: Secondary | ICD-10-CM

## 2015-08-19 DIAGNOSIS — R519 Headache, unspecified: Secondary | ICD-10-CM | POA: Insufficient documentation

## 2015-08-19 DIAGNOSIS — R590 Localized enlarged lymph nodes: Secondary | ICD-10-CM | POA: Diagnosis not present

## 2015-08-19 LAB — CBC WITH DIFFERENTIAL/PLATELET
BASOS ABS: 0.2 10*3/uL — AB (ref 0.0–0.1)
Basophils Relative: 2 %
EOS ABS: 0.2 10*3/uL (ref 0.0–0.7)
Eosinophils Relative: 2 %
HCT: 34.3 % — ABNORMAL LOW (ref 36.0–46.0)
HEMOGLOBIN: 11.2 g/dL — AB (ref 12.0–15.0)
LYMPHS PCT: 31 %
Lymphs Abs: 2.5 10*3/uL (ref 0.7–4.0)
MCH: 28.8 pg (ref 26.0–34.0)
MCHC: 32.7 g/dL (ref 30.0–36.0)
MCV: 88.2 fL (ref 78.0–100.0)
MONOS PCT: 12 %
Monocytes Absolute: 0.9 10*3/uL (ref 0.1–1.0)
NEUTROS PCT: 53 %
Neutro Abs: 4.1 10*3/uL (ref 1.7–7.7)
PLATELETS: 61 10*3/uL — AB (ref 150–400)
RBC: 3.89 MIL/uL (ref 3.87–5.11)
RDW: 17 % — ABNORMAL HIGH (ref 11.5–15.5)
WBC: 7.9 10*3/uL (ref 4.0–10.5)

## 2015-08-19 LAB — D-DIMER, QUANTITATIVE (NOT AT ARMC): D DIMER QUANT: 1.94 ug{FEU}/mL — AB (ref 0.00–0.48)

## 2015-08-19 LAB — COMPREHENSIVE METABOLIC PANEL
ALBUMIN: 2.9 g/dL — AB (ref 3.5–5.0)
ALK PHOS: 88 U/L (ref 38–126)
ALT: 16 U/L (ref 14–54)
ANION GAP: 12 (ref 5–15)
AST: 61 U/L — ABNORMAL HIGH (ref 15–41)
BILIRUBIN TOTAL: 0.6 mg/dL (ref 0.3–1.2)
BUN: 10 mg/dL (ref 6–20)
CALCIUM: 9 mg/dL (ref 8.9–10.3)
CO2: 25 mmol/L (ref 22–32)
Chloride: 105 mmol/L (ref 101–111)
Creatinine, Ser: 1.14 mg/dL — ABNORMAL HIGH (ref 0.44–1.00)
GFR calc non Af Amer: 44 mL/min — ABNORMAL LOW (ref >60)
GFR, EST AFRICAN AMERICAN: 51 mL/min — AB (ref >60)
Glucose, Bld: 90 mg/dL (ref 65–99)
POTASSIUM: 3.7 mmol/L (ref 3.5–5.1)
SODIUM: 142 mmol/L (ref 135–145)
TOTAL PROTEIN: 5.4 g/dL — AB (ref 6.5–8.1)

## 2015-08-19 LAB — RETICULOCYTES
RBC.: 3.75 MIL/uL — AB (ref 3.87–5.11)
RETIC COUNT ABSOLUTE: 26.3 10*3/uL (ref 19.0–186.0)
RETIC CT PCT: 0.7 % (ref 0.4–3.1)

## 2015-08-19 LAB — LACTATE DEHYDROGENASE
LDH: 3434 U/L — AB (ref 98–192)
LDH: 3009 U/L — ABNORMAL HIGH (ref 98–192)

## 2015-08-19 LAB — ABO/RH: ABO/RH(D): O POS

## 2015-08-19 LAB — PREPARE PLATELET PHERESIS: UNIT DIVISION: 0

## 2015-08-19 LAB — HIV ANTIBODY (ROUTINE TESTING W REFLEX): HIV SCREEN 4TH GENERATION: NONREACTIVE

## 2015-08-19 LAB — PROTIME-INR
INR: 1.23 (ref 0.00–1.49)
PROTHROMBIN TIME: 15.7 s — AB (ref 11.6–15.2)

## 2015-08-19 LAB — FIBRINOGEN: FIBRINOGEN: 550 mg/dL — AB (ref 204–475)

## 2015-08-19 LAB — URIC ACID: Uric Acid, Serum: 6.5 mg/dL (ref 2.3–6.6)

## 2015-08-19 MED ORDER — PROMETHAZINE HCL 25 MG/ML IJ SOLN
12.5000 mg | INTRAMUSCULAR | Status: DC | PRN
  Administered 2015-08-19 – 2015-08-30 (×5): 12.5 mg via INTRAVENOUS
  Filled 2015-08-19 (×4): qty 1

## 2015-08-19 MED ORDER — GADOBENATE DIMEGLUMINE 529 MG/ML IV SOLN
20.0000 mL | Freq: Once | INTRAVENOUS | Status: AC | PRN
  Administered 2015-08-19: 20 mL via INTRAVENOUS

## 2015-08-19 MED ORDER — ONDANSETRON HCL 4 MG/2ML IJ SOLN
4.0000 mg | Freq: Four times a day (QID) | INTRAMUSCULAR | Status: DC | PRN
  Administered 2015-08-19: 4 mg via INTRAVENOUS
  Filled 2015-08-19: qty 2

## 2015-08-19 MED ORDER — MAGNESIUM SULFATE 2 GM/50ML IV SOLN
2.0000 g | Freq: Once | INTRAVENOUS | Status: AC
  Administered 2015-08-19: 2 g via INTRAVENOUS
  Filled 2015-08-19: qty 50

## 2015-08-19 MED ORDER — ONDANSETRON HCL 4 MG/2ML IJ SOLN
4.0000 mg | Freq: Once | INTRAMUSCULAR | Status: AC
  Administered 2015-08-19: 4 mg via INTRAVENOUS
  Filled 2015-08-19: qty 2

## 2015-08-19 MED ORDER — ONDANSETRON HCL 40 MG/20ML IJ SOLN
8.0000 mg | Freq: Four times a day (QID) | INTRAMUSCULAR | Status: DC | PRN
  Filled 2015-08-19: qty 4

## 2015-08-19 NOTE — Progress Notes (Signed)
Pt nauseous this am. Zofran given twice during night shift with no relief. Pt still vomiting. Paged Dr. Waiting to hear back from Dr. Aletha Halim continue to monitor pt. Bed remains in lowest position and call bell is within reach.

## 2015-08-19 NOTE — Progress Notes (Signed)
Initial Nutrition Assessment  DOCUMENTATION CODES:   Not applicable  INTERVENTION:   RD to provide nutritional supplements once diet advances especially if po intake is poor.   NUTRITION DIAGNOSIS:   Inadequate oral intake related to poor appetite as evidenced by meal completion < 50%.  GOAL:   Patient will meet greater than or equal to 90% of their needs  MONITOR:   Diet advancement, Weight trends, Labs, I & O's  REASON FOR ASSESSMENT:   Malnutrition Screening Tool    ASSESSMENT:   81 year old lady a history of cervical cancer in the 1970s status-post hysterectomy and gastroesophageal reflux disease status-post Nissen fundoplication who presents with a one month history of progressive nausea, vomiting, night sweats, weight loss, lower abdominal pain, headache, chin numbness, and easy brusing. CT of her abdomen and pelvis showed retroperitoneal and lower posterior mediastinal lymphadenopathy suggestive of lymphoma, and a subtle increased sacral epidural soft tissue that cannot be excluded as a spinal metastasis.  Pt is currently NPO for biopsy today with IR. Prior to NPO status, meal completion has been 50%. Pt does reports having a lack of appetite however still consumes at least 3 meals a day. Usual body weight reported to be ~174 lbs. Noted pt with n/v throughout the night and early this AM. RD to order nutritional supplements once diet advances, especially if po intake is poor.   Pt with no observed significant fat or muscle mass loss.   Labs and medications reviewed.   Diet Order:  Diet NPO time specified Except for: Ice Chips, Sips with Meds  Skin:  Reviewed, no issues  Last BM:  10/7  Height:   Ht Readings from Last 1 Encounters:  08/18/15 5' 7.2" (1.707 m)    Weight:   Wt Readings from Last 1 Encounters:  08/19/15 179 lb 8 oz (81.421 kg)    Ideal Body Weight:  61.8 kg  BMI:  Body mass index is 27.94 kg/(m^2).  Estimated Nutritional Needs:   Kcal:   4825-0037  Protein:  85-100 grams  Fluid:  1.8 - 2 L/day  EDUCATION NEEDS:   No education needs identified at this time  Corrin Parker, MS, RD, LDN Pager # [phone] After hours/ weekend pager # [phone]

## 2015-08-19 NOTE — Consult Note (Signed)
[name] Kitchen    HEMATOLOGY/ONCOLOGY CONSULTATION NOTE  Date of Service: 08/19/2015  No care team member to display  CHIEF COMPLAINTS/PURPOSE OF CONSULTATION:  Retroperitoneal LN and thrombocytopenia  HISTORY OF PRESENTING ILLNESS:  Kristen Baker is a wonderful 81 y.o. female who has been referred to [place] by Dr Michel Bickers, MD for evaluation and management of new thrombocytopenia and retroperitoneal LNadenopathy with concern for lymphoma.  Patient has a remote history of cervical cancer about 50 yrs ago but has apparently been in her usual state of health till about 2-3 months ago. She was completely independent at baseline, lives by herself in a mobile home with family close by and still driving her own car. Over the last 2-3 months she has noted progressively increasing fatigue, drenching nightsweats, anorexia and weight loss. She notes the symptoms have been especially pronounced in the last 2-3 weeks and she is fatigued to a point that she has been having difficulties caring for herself. She also notes that she has been having intermittent headache that feel like an "electric shock" through her whole head. This has been associated with development of chin numbness but no other overt focal neurological deficits.  Patient came to the ER and noted back pain and some abdominal pain and had a CT abdomen which showed retroperitoneal and posterior mediastinal LNadenopathy. She was noted to have new thrombocytopenia to 34k. Has received 1 pheresis unit of PLTs with an appropriate increase in PLT to the 60k range for biopsies.   MEDICAL HISTORY:  Past Medical History  Diagnosis Date  . Cancer [facility])     cervical cancer  Gastric Ulcers s/p partial gastrectomy.  SURGICAL HISTORY: Past Surgical History  Procedure Laterality Date  . Cholecystectomy    . Tonsillectomy    . Abdominal hysterectomy    . Abdominal surgery      2/3 rd of stomach removed for gastric ulcers    SOCIAL HISTORY: Social  History   Social History  . Marital Status: Married    Spouse Name: N/A  . Number of Children: N/A  . Years of Education: N/A   Occupational History  . Not on file.   Social History Main Topics  . Smoking status: Never Smoker   . Smokeless tobacco: Not on file  . Alcohol Use: No  . Drug Use: No  . Sexual Activity: Not on file   Other Topics Concern  . Not on file   Social History Narrative  . No narrative on file  Ex smoker - smoked for 15 yrs quit at age 31 yrs   FAMILY HISTORY:  Daughter and paternal 54 with breast cancer   ALLERGIES:  is allergic to cabbage; onion; shellfish allergy; and sulfa antibiotics.  MEDICATIONS:  Current Facility-Administered Medications  Medication Dose Route Frequency Provider Last Rate Last Dose  . Influenza vac split quadrivalent PF (FLUARIX) injection 0.5 mL  0.5 mL Intramuscular Tomorrow-1000 Michel Bickers, MD      . iohexol (OMNIPAQUE) 300 MG/ML solution 25 mL  25 mL Oral Once PRN Medication Radiologist, MD   25 mL at 08/18/15 1819  . pantoprazole (PROTONIX) EC tablet 40 mg  40 mg Oral Daily Tasrif Ahmed, MD   40 mg at 08/19/15 2135  . pneumococcal 23 valent vaccine (PNU-IMMUNE) injection 0.5 mL  0.5 mL Intramuscular Tomorrow-1000 Michel Bickers, MD      . promethazine (PHENERGAN) injection 12.5 mg  12.5 mg Intravenous Q4H PRN Iline Oven, MD   12.5 mg at 08/19/15 2053  .  traMADol (ULTRAM) tablet 50 mg  50 mg Oral Q6H PRN Loleta Chance, MD   50 mg at 08/19/15 0025  . [START ON 08/20/2015] Vitamin D (Ergocalciferol) (DRISDOL) capsule 50,000 Units  50,000 Units Oral Q7 days Dellia Nims, MD        REVIEW OF SYSTEMS:    10 Point review of Systems was done is negative except as noted above.  PHYSICAL EXAMINATION: ECOG PERFORMANCE STATUS: 2 - Symptomatic, <50% confined to bed  . Filed Vitals:   08/19/15 0154 08/19/15 0541 08/19/15 0544 08/19/15 1407  BP: 135/58 123/57  134/58  Pulse: 70 66  63  Temp: 97.3 F (36.3 C) 97.8 F  (36.6 C)  98.4 F (36.9 C)  TempSrc: Oral Oral  Oral  Resp: 17 16  18   Height:      Weight:   179 lb 8 oz (81.421 kg)   SpO2: 95% 91%  91%     Filed Weights   08/18/15 1336 08/18/15 2054 08/19/15 0544  Weight: 174 lb 14.4 oz (79.334 kg) 176 lb (79.833 kg) 179 lb 8 oz (81.421 kg)   .Body mass index is 27.94 kg/(m^2).  GENERAL:alert, in no acute distress and comfortable SKIN: skin color, texture, turgor are normal, no rashes or significant lesions EYES: normal, conjunctiva are pink and non-injected, sclera clear OROPHARYNX:no exudate, no erythema and lips, buccal mucosa, and tongue normal  NECK: supple, no JVD, thyroid normal size, non-tender, without nodularity LYMPH:  no palpable lymphadenopathy in the cervical, axillary or inguinal LUNGS: clear to auscultation with normal respiratory effort HEART: regular rate & rhythm,  no murmurs and no lower extremity edema ABDOMEN: abdomen soft, non-tender, normoactive bowel sounds  Musculoskeletal: no cyanosis of digits and no clubbing  PSYCH: alert & oriented x 3 with fluent speech NEURO: no focal motor/sensory deficits  LABORATORY DATA:  I have reviewed the data as listed  . CBC Latest Ref Rng 08/19/2015 08/18/2015 08/18/2015  WBC 4.0 - 10.5 K/uL 7.9 - 7.0  Hemoglobin 12.0 - 15.0 g/dL 11.2(L) - 12.6  Hematocrit 36.0 - 46.0 % 34.3(L) - 38.0  Platelets 150 - 400 K/uL 61(L) 31(L) 34(L)    . CMP Latest Ref Rng 08/19/2015 08/18/2015  Glucose 65 - 99 mg/dL 90 88  BUN 6 - 20 mg/dL 10 13  Creatinine 0.44 - 1.00 mg/dL 1.14(H) 1.30(H)  Sodium 135 - 145 mmol/L 142 137  Potassium 3.5 - 5.1 mmol/L 3.7 3.1(L)  Chloride 101 - 111 mmol/L 105 100(L)  CO2 22 - 32 mmol/L 25 22  Calcium 8.9 - 10.3 mg/dL 9.0 9.0  Total Protein 6.5 - 8.1 g/dL 5.4(L) 5.7(L)  Total Bilirubin 0.3 - 1.2 mg/dL 0.6 0.8  Alkaline Phos 38 - 126 U/L 88 95  AST 15 - 41 U/L 61(H) 69(H)  ALT 14 - 54 U/L 16 16   . Lab Results  Component Value Date   LDH 3009* 08/19/2015      RADIOGRAPHIC STUDIES: I have personally reviewed the radiological images as listed and agreed with the findings in the report. Ct Abdomen Pelvis W Contrast  08/18/2015   CLINICAL DATA:  81 year old female with lower abdominal pain nausea and vomiting for 1 month. Initial encounter.  EXAM: CT ABDOMEN AND PELVIS WITH CONTRAST  TECHNIQUE: Multidetector CT imaging of the abdomen and pelvis was performed using the standard protocol following bolus administration of intravenous contrast.  CONTRAST:  34mL OMNIPAQUE IOHEXOL 300 MG/ML SOLN, 118mL OMNIPAQUE IOHEXOL 300 MG/ML SOLN  COMPARISON:  [facility] Chest  CTA 11/20/2012. Acute abdominal series from today  FINDINGS: Mild respiratory motion artifact at the lung bases. There are chronic surgical clips about the distal thoracic esophagus and gastroesophageal junction. New since 2014 in the posterior mediastinum and tracking toward the retrocrural space is abnormal confluent soft tissue anterior to the thoracic spine and inseparable from the medial wall of the descending thoracic aorta measuring 3 x 44 x 54 mm (AP by transverse by CC). This seems to be separate from both the aorta and esophagus. There is no anterior thoracic spine erosion. Calcified plaque along this segment of the aorta.  No associated pericardial or pleural effusion. Mild bronchiectasis at both lung bases with no confluent pulmonary opacity.  Degenerative changes in the spine. Chronic 10 mm lucent area in the left T12 vertebral body is stable and most resembled hemangioma in 2014. No acute or suspicious osseous lesion is identified however, there is subtle increased ventral epidural soft tissue seen in the sacrum -sagittal image 73.  Mild to moderate nonspecific presacral stranding. No pelvic free fluid. Negative rectum with retained stool. Uterus surgically absent. Adnexa within normal limits. Numerous pelvic phleboliths. Negative urinary bladder.  Redundant sigmoid colon. Proximal sigmoid  and left colon diverticulosis with no active inflammation identified. Negative transverse colon. Negative right colon. Ileocecal valve lipoma incidentally noted. Appendix diminutive or absent. Negative terminal ileum. No dilated or abnormal small bowel loops. Occasional surgical clips in the greater omentum. Diminutive stomach. Duodenum within normal limits.  Major arterial structures are patent in the abdomen and pelvis with fairly extensive calcified aortic atherosclerosis. Portal venous system appears to be patent.  Retroperitoneal lymphadenopathy maximal at the lower lumbar spine level anterior to the IVC measuring up to 22 mm short axis. Numerous increased para renal and other retroperitoneal space nodes are individually up to 14 mm short axis. Mesenteric nodes in the abdomen and pelvis have a more normal appearance. No pelvic sidewall or inguinal lymphadenopathy identified.  Solitary small nonspecific low-density area in the right hepatic lobe measuring 10 mm on series 2, image 20, favor benign. Gallbladder not identified and felt to be surgically absent. No splenomegaly or splenic lesion. Negative pancreas and adrenal glands. Bilateral renal enhancement and contrast excretion within normal limits. No abdominal free fluid.  IMPRESSION: 1. Retroperitoneal and lower posterior mediastinal / retrocrural soft tissue masses most compatible with lymphadenopathy, and most suggestive of Lymphoma. Some of these might be amenable to CT-guided biopsy, uncertain. 2. Subtle increased sacral epidural soft tissue, but no destructive osseous lesion identified. Nonspecific presacral stranding. Metastatic disease to the spine not excluded.   Electronically Signed   By: Genevie Ann M.D.   On: 08/18/2015 18:52   Dg Abd Acute W/chest  08/18/2015   CLINICAL DATA:  Chest pain and abdominal pain.  EXAM: DG ABDOMEN ACUTE W/ 1V CHEST  COMPARISON:  Chest x-ray dated 12/12/2012 performed at [place]: Heart size and  pulmonary vascularity are normal and the lungs are clear. Surgical clips at the gastroesophageal junction. Slight thoracolumbar scoliosis.  No free air or free fluid in the abdomen. Bowel gas pattern is normal. Surgical clips and staples in the abdomen. Multiple phleboliths in the pelvis. No acute osseous abnormality.  IMPRESSION: Negative abdominal radiographs.  No acute cardiopulmonary disease.   Electronically Signed   By: Lorriane Shire M.D.   On: 08/18/2015 17:17    Peripheral Blood Smear (Personally reviewed by me)  Normocytic RBC's. No increased schistocytes. nRBC's noted. Decreased platelets, no platelet clumping noted. Myeloid  left shift. Presence of multiple large atypical lymphocytes/blasts.  ASSESSMENT & PLAN:   81 yo caucasian female with   1) Retroperitoneal and Posterior Mediastinal Lnadenopathy with significantly elevated LDH and leukoerythroblastic picture on peipheral blood smear with multiple large atypical Lymphocytes vs blast concerning for a high grade lymphoma. Patient has significant type B constitutional symptoms. Likely Stage IV 2) Chin numbness concerning for possible leptomeningeal involvement. 3) Thrombocytopenia likely related to lymphoma. 4) Abdominal fatigue/back pain - likely from retroperitoneal LNadenopathy Plan  -CT chest to complete staging. (ordered without contrast given CKD and recent IV contrast dye load and high risk of TLS) -will need PET/CT for complete staging. -CT guided Retroperitoneal LN biopsy -CT guided bone marrow aspiration and biopsy -ordered peripheral blood flow cytometry -MRI brain w and w/o contrast -Fluoroscopic LP for diagnosis of possible CNS involvement. -further management based on tissue diagnosis. -nutritional consultation -PT/OT evaluation  All of the patients and her family's questions were answered to their apparent satisfaction. Informed consent was obtained for all the diagnostic testing and the rationale was explained  in details. T   I spent 70 minutes counseling the patient face to face. The total time spent in the appointment was 80 minutes and more than 50% was on counseling and direct patient cares.    Sullivan Lone MD [place] AAHIVMS [facility] Hematology/Oncology Physician [place]  (Office):       [phone] (Work cell):  [phone] (Fax):           [phone]  08/19/2015 4:14 PM

## 2015-08-19 NOTE — Care Management Note (Addendum)
Case Management Note  Patient Details  Name: Kristen Baker MRN: [id number] Date of Birth: 03-Mar-1933  Subjective/Objective:                  Date-10-10 Monday Initial Assessment Spoke with patient at the bedside along with daughter Rosalee Kaufman [phone].  Introduced self as Tourist information centre manager and explained role in discharge planning and how to be reached.  Verified patient lives at [place] alone..  Verified patient anticipates to go home with alone, at time of discharge and will have part-time supervision by family  at this time to best of their knowledge.  Patient has DME cane. Expressed need for a shower chair at discharge. Instructed family that insurance will not pay for this, but one can be ordered or obtained through [facility] store.  Patient denied  needing help with their medication.  Patient drives to MD appointments.  Verified patient has PCP.   Plan: CM will continue to follow for discharge planning and [facility] resources.   Carles Collet RN BSN CM [phone]   Action/Plan:  Referral placed to [facility] for [place].  Expected Discharge Date:                  Expected Discharge Plan:  Home/Self Care  In-House Referral:     Discharge planning Services  CM Consult  Post Acute Care Choice:    Choice offered to:  Adult Children  DME Arranged:    DME Agency:     HH Arranged:    HH Agency:     Status of Service:  In process, will continue to follow  Medicare Important Message Given:    Date Medicare IM Given:    Medicare IM give by:    Date Additional Medicare IM Given:    Additional Medicare Important Message give by:     If discussed at [place] of Stay Meetings, dates discussed:    Additional Comments:  Carles Collet, RN 08/19/2015, 10:53 AM

## 2015-08-19 NOTE — Progress Notes (Signed)
Subjective: Kristen Baker.  Patient continues to complain of nausea unrelieved by Zofran.  She has previously responded to phenergan and would like to try that for relief.  She has never been told she has thrombocytopenia before.  However, she had been receiving B12 injections and IV iron for persistent anemia.   She has had a few weeks of night sweats, but denies ever having felt a LN or other mass.  Objective: Vital signs in last 24 hours: Filed Vitals:   08/19/15 0154 08/19/15 0541 08/19/15 0544 08/19/15 1407  BP: 135/58 123/57  134/58  Pulse: 70 66  63  Temp: 97.3 F (36.3 C) 97.8 F (36.6 C)  98.4 F (36.9 C)  TempSrc: Oral Oral  Oral  Resp: 17 16  18   Height:      Weight:   179 lb 8 oz (81.421 kg)   SpO2: 95% 91%  91%   Weight change:   Intake/Output Summary (Last 24 hours) at 08/19/15 1722 Last data filed at 08/19/15 1722  Gross per 24 hour  Intake    715 ml  Output   1100 ml  Net   -385 ml   Physical Exam  Constitutional: She is oriented to person, place, and time and well-developed, well-nourished, and in no distress. No distress.  Elderly female, lying in bed.  Appears fatigued, but not in acute distress.  HENT:  Head: Normocephalic and atraumatic.  Eyes: EOM are normal. No scleral icterus.  Neck: No JVD present. No tracheal deviation present.  Cardiovascular: Normal rate, regular rhythm, normal heart sounds and intact distal pulses.   Pulmonary/Chest: Effort normal and breath sounds normal. No respiratory distress. She has no wheezes.  Abdominal: Soft. She exhibits no distension. There is no rebound and no guarding.  Minimally tender to deep palpation in LLQ.  Musculoskeletal: She exhibits no edema.  Neurological: She is alert and oriented to person, place, and time.  Skin: Skin is warm and dry. No rash noted. She is not diaphoretic.    Lab Results: Basic Metabolic Panel:  Recent Labs Lab 08/18/15 1408 08/18/15 2128 08/19/15 0458  NA 137  --  142  K 3.1*   --  3.7  CL 100*  --  105  CO2 22  --  25  GLUCOSE 88  --  90  BUN 13  --  10  CREATININE 1.30*  --  1.14*  CALCIUM 9.0  --  9.0  MG  --  1.7  --    Liver Function Tests:  Recent Labs Lab 08/18/15 1408 08/19/15 0458  AST 69* 61*  ALT 16 16  ALKPHOS 95 88  BILITOT 0.8 0.6  PROT 5.7* 5.4*  ALBUMIN 3.2* 2.9*    Recent Labs Lab 08/18/15 1408  LIPASE 23   No results for input(s): AMMONIA in the last 168 hours. CBC:  Recent Labs Lab 08/18/15 1408 08/18/15 1824 08/19/15 0458  WBC 7.0  --  7.9  NEUTROABS 4.0  --  4.1  HGB 12.6  --  11.2*  HCT 38.0  --  34.3*  MCV 87.4  --  88.2  PLT 34* 31* 61*   Cardiac Enzymes: No results for input(s): CKTOTAL, CKMB, CKMBINDEX, TROPONINI in the last 168 hours. BNP: No results for input(s): PROBNP in the last 168 hours. D-Dimer: No results for input(s): DDIMER in the last 168 hours. CBG: No results for input(s): GLUCAP in the last 168 hours. Hemoglobin A1C: No results for input(s): HGBA1C in the last 168 hours.  Fasting Lipid Panel: No results for input(s): CHOL, HDL, LDLCALC, TRIG, CHOLHDL, LDLDIRECT in the last 168 hours. Thyroid Function Tests: No results for input(s): TSH, T4TOTAL, FREET4, T3FREE, THYROIDAB in the last 168 hours. Coagulation:  Recent Labs Lab 08/18/15 1824 08/19/15 0458  LABPROT 14.8 15.7*  INR 1.15 1.23   Anemia Panel: No results for input(s): VITAMINB12, FOLATE, FERRITIN, TIBC, IRON, RETICCTPCT in the last 168 hours. Urine Drug Screen: Drugs of Abuse  No results found for: LABOPIA, COCAINSCRNUR, LABBENZ, AMPHETMU, THCU, LABBARB  Alcohol Level: No results for input(s): ETH in the last 168 hours. Urinalysis:  Recent Labs Lab 08/18/15 1600  COLORURINE YELLOW  LABSPEC 1.015  PHURINE 6.0  GLUCOSEU NEGATIVE  HGBUR TRACE*  BILIRUBINUR NEGATIVE  KETONESUR 15*  PROTEINUR NEGATIVE  UROBILINOGEN 0.2  NITRITE NEGATIVE  LEUKOCYTESUR NEGATIVE   Misc. Labs:   Micro Results: No results found  for this or any previous visit (from the past 240 hour(s)). Studies/Results: Ct Abdomen Pelvis W Contrast  08/18/2015   CLINICAL DATA:  81 year old female with lower abdominal pain nausea and vomiting for 1 month. Initial encounter.  EXAM: CT ABDOMEN AND PELVIS WITH CONTRAST  TECHNIQUE: Multidetector CT imaging of the abdomen and pelvis was performed using the standard protocol following bolus administration of intravenous contrast.  CONTRAST:  86mL OMNIPAQUE IOHEXOL 300 MG/ML SOLN, 124mL OMNIPAQUE IOHEXOL 300 MG/ML SOLN  COMPARISON:  [facility] Chest CTA 11/20/2012. Acute abdominal series from today  FINDINGS: Mild respiratory motion artifact at the lung bases. There are chronic surgical clips about the distal thoracic esophagus and gastroesophageal junction. New since 2014 in the posterior mediastinum and tracking toward the retrocrural space is abnormal confluent soft tissue anterior to the thoracic spine and inseparable from the medial wall of the descending thoracic aorta measuring 3 x 44 x 54 mm (AP by transverse by CC). This seems to be separate from both the aorta and esophagus. There is no anterior thoracic spine erosion. Calcified plaque along this segment of the aorta.  No associated pericardial or pleural effusion. Mild bronchiectasis at both lung bases with no confluent pulmonary opacity.  Degenerative changes in the spine. Chronic 10 mm lucent area in the left T12 vertebral body is stable and most resembled hemangioma in 2014. No acute or suspicious osseous lesion is identified however, there is subtle increased ventral epidural soft tissue seen in the sacrum -sagittal image 73.  Mild to moderate nonspecific presacral stranding. No pelvic free fluid. Negative rectum with retained stool. Uterus surgically absent. Adnexa within normal limits. Numerous pelvic phleboliths. Negative urinary bladder.  Redundant sigmoid colon. Proximal sigmoid and left colon diverticulosis with no active  inflammation identified. Negative transverse colon. Negative right colon. Ileocecal valve lipoma incidentally noted. Appendix diminutive or absent. Negative terminal ileum. No dilated or abnormal small bowel loops. Occasional surgical clips in the greater omentum. Diminutive stomach. Duodenum within normal limits.  Major arterial structures are patent in the abdomen and pelvis with fairly extensive calcified aortic atherosclerosis. Portal venous system appears to be patent.  Retroperitoneal lymphadenopathy maximal at the lower lumbar spine level anterior to the IVC measuring up to 22 mm short axis. Numerous increased para renal and other retroperitoneal space nodes are individually up to 14 mm short axis. Mesenteric nodes in the abdomen and pelvis have a more normal appearance. No pelvic sidewall or inguinal lymphadenopathy identified.  Solitary small nonspecific low-density area in the right hepatic lobe measuring 10 mm on series 2, image 20, favor benign. Gallbladder not identified and  felt to be surgically absent. No splenomegaly or splenic lesion. Negative pancreas and adrenal glands. Bilateral renal enhancement and contrast excretion within normal limits. No abdominal free fluid.  IMPRESSION: 1. Retroperitoneal and lower posterior mediastinal / retrocrural soft tissue masses most compatible with lymphadenopathy, and most suggestive of Lymphoma. Some of these might be amenable to CT-guided biopsy, uncertain. 2. Subtle increased sacral epidural soft tissue, but no destructive osseous lesion identified. Nonspecific presacral stranding. Metastatic disease to the spine not excluded.   Electronically Signed   By: Genevie Ann M.D.   On: 08/18/2015 18:52   Dg Abd Acute W/chest  08/18/2015   CLINICAL DATA:  Chest pain and abdominal pain.  EXAM: DG ABDOMEN ACUTE W/ 1V CHEST  COMPARISON:  Chest x-ray dated 12/12/2012 performed at [facility]: Heart size and pulmonary vascularity are normal and the lungs are  clear. Surgical clips at the gastroesophageal junction. Slight thoracolumbar scoliosis.  No free air or free fluid in the abdomen. Bowel gas pattern is normal. Surgical clips and staples in the abdomen. Multiple phleboliths in the pelvis. No acute osseous abnormality.  IMPRESSION: Negative abdominal radiographs.  No acute cardiopulmonary disease.   Electronically Signed   By: Lorriane Shire M.D.   On: 08/18/2015 17:17   Medications: I have reviewed the patient's current medications. Scheduled Meds: . Influenza vac split quadrivalent PF  0.5 mL Intramuscular Tomorrow-1000  . pantoprazole  40 mg Oral Daily  . pneumococcal 23 valent vaccine  0.5 mL Intramuscular Tomorrow-1000  . [START ON 08/20/2015] Vitamin D (Ergocalciferol)  50,000 Units Oral Q7 days   Continuous Infusions:  PRN Meds:.iohexol, promethazine, traMADol Assessment/Plan: Principal Problem:   Retroperitoneal lymphadenopathy Active Problems:   Thrombocytopenia (HCC)   Weight loss   Generalized weakness   Vitamin B12 deficiency   GERD (gastroesophageal reflux disease)   Hx of gastric ulcer   Hx of cervical cancer   Hypokalemia  Ms. Arambula is a pleasant 81 year old lady presenting with a one-month history of B-symptoms, lower band-like abdominal pain, headaches, and chin numbness, found to have severe thrombocytopenia and retroperitoneal lymphadenopathy concerning for lymphoma and possibly a spinal metastasis.   Retroperitoneal lymphadenopathy: Patient with undiagnosed etiology of persistent anemia and now thrombocytopenia has had B symptoms and imaging suggestive of lymphoma. Notes from [facility] in July describe treatment for persistent anemia with normal platelet count.  Therefore, her current thrombocytopenia appears to be a more acute development.   Given her prominent lymphadenopathy without splenomegaly, there is now concern for bone marrow involvement.  Patient also complaining of HA and bilateral numb chin.  Numb  chin syndrome is often 2/2 CNS involvement of lymphoma.  Heme-Onc has been consulted and we appreciate their help. - Heme/Onc consult - IR for CT guided biopsy of LN - Will consider CT chest and contrasted MRI and LP for evaluation of disease burden  Thrombocytopenia: Likely 2/2 etiology underlying retroperitoneal LAD.  She is s/p 1 U plts with appropriate response.  Will continue to monitor pending Heme/Onc evaluation. -Peripheral smear with left shift and polychromasia, but no schistocytes -EBV -Hepatitis labs -HIV negative -HTLV  Abdominal pain and nausea: Per above, likely related to her lymphadenopathy. Urinalysis was clean and she has been having normal bowel movements.  -Tramadol as needed - Phenergan as needed  Headache: Likely 2/2 etiology underlying her retroperitoneal LAD.  Considering contrast MRI and LP as above. -Tramadol as needed  Gastroesophageal reflux disease: Not an acute issue. -Continue omeprazole  Dispo: Disposition  is deferred at this time, awaiting improvement of current medical problems.    The patient does not have a current PCP (No primary care provider on file.) and does need an [facility] hospital follow-up appointment after discharge.  The patient does not have transportation limitations that hinder transportation to clinic appointments.  .Services Needed at time of discharge: Y = Yes, Blank = No PT:   OT:   RN:   Equipment:   Other:       Iline Oven, MD 08/19/2015, 5:22 PM

## 2015-08-19 NOTE — Progress Notes (Signed)
Patient ID: Kristen Baker, female   DOB: 1933-04-26, 81 y.o.   MRN: [id number]  Date: 08/19/2015  Patient name: Kristen Baker  Medical record number: [id number]  Date of birth: 1933/01/21   I have seen and evaluated Kristen Baker and discussed their care with the Residency Team.   Assessment and Plan: I have seen and evaluated the patient as outlined above. I agree with the formulated Assessment and Plan as detailed in the residents' admission note. Kristen Baker is an 81 year old who was found to be B12 deficient last year. She was started on B12 injections. She was seen at the [facility] cancer center on 05/27/2015 by Hosie Poisson for mild normocytic anemia. At that time her hemoglobin was 11.1 with an MCV of 85.9. Her white blood cell count and platelet counts were normal at that time. She could not tolerate oral iron so she was started on iron infusions.   Over the past month she has developed progressive nausea, vomiting, abdominal pain and an unintentional 15 pound weight loss. She's also had some sweats. She is not aware of having had any fevers. She has had intermittent, 10 out of 10 headaches. She's developed bilateral numbness of her chin. Over the past week she has noted easy bruisability.  On admission yesterday she was noted to have a platelet count of 34,000. She had a normal white blood cell count but her smear showed atypical lymphocytes. Her hemoglobin was normal. Her serum LDH was 3434. Her creatinine was mildly elevated at 1.3 and her AST was 69.  An abdominal CT scan revealed:  IMPRESSION: 1. Retroperitoneal and lower posterior mediastinal / retrocrural soft tissue masses most compatible with lymphadenopathy, and most suggestive of Lymphoma. Some of these might be amenable to CT-guided biopsy, uncertain. 2. Subtle increased sacral epidural soft tissue, but no destructive osseous lesion identified. Nonspecific presacral stranding. Metastatic disease to the spine not  excluded.  The overall picture is concerning for lymphoproliferative disorder. Hematology oncology consult has been requested. We will consider chest CT scanning. Because of her intermittent headaches and chin numbness I'm concerned about CNS involvement. We will also consider contrasted MRI of her brain and lumbar puncture.  Michel Bickers, MD 10/10/201612:34 PM

## 2015-08-20 ENCOUNTER — Observation Stay (HOSPITAL_COMMUNITY): Payer: Medicare Other

## 2015-08-20 ENCOUNTER — Encounter (HOSPITAL_COMMUNITY): Payer: Self-pay | Admitting: Radiology

## 2015-08-20 DIAGNOSIS — C8379 Burkitt lymphoma, extranodal and solid organ sites: Secondary | ICD-10-CM | POA: Diagnosis not present

## 2015-08-20 DIAGNOSIS — E860 Dehydration: Secondary | ICD-10-CM | POA: Diagnosis present

## 2015-08-20 DIAGNOSIS — K5900 Constipation, unspecified: Secondary | ICD-10-CM | POA: Diagnosis present

## 2015-08-20 DIAGNOSIS — Z903 Acquired absence of stomach [part of]: Secondary | ICD-10-CM | POA: Diagnosis not present

## 2015-08-20 DIAGNOSIS — R590 Localized enlarged lymph nodes: Secondary | ICD-10-CM | POA: Diagnosis not present

## 2015-08-20 DIAGNOSIS — Z9189 Other specified personal risk factors, not elsewhere classified: Secondary | ICD-10-CM | POA: Insufficient documentation

## 2015-08-20 DIAGNOSIS — D6489 Other specified anemias: Secondary | ICD-10-CM | POA: Diagnosis not present

## 2015-08-20 DIAGNOSIS — C9590 Leukemia, unspecified not having achieved remission: Secondary | ICD-10-CM | POA: Diagnosis not present

## 2015-08-20 DIAGNOSIS — C8378 Burkitt lymphoma, lymph nodes of multiple sites: Secondary | ICD-10-CM | POA: Diagnosis present

## 2015-08-20 DIAGNOSIS — N179 Acute kidney failure, unspecified: Secondary | ICD-10-CM | POA: Diagnosis not present

## 2015-08-20 DIAGNOSIS — C8100 Nodular lymphocyte predominant Hodgkin lymphoma, unspecified site: Secondary | ICD-10-CM | POA: Diagnosis not present

## 2015-08-20 DIAGNOSIS — C8510 Unspecified B-cell lymphoma, unspecified site: Secondary | ICD-10-CM | POA: Diagnosis not present

## 2015-08-20 DIAGNOSIS — Z9049 Acquired absence of other specified parts of digestive tract: Secondary | ICD-10-CM | POA: Diagnosis not present

## 2015-08-20 DIAGNOSIS — D801 Nonfamilial hypogammaglobulinemia: Secondary | ICD-10-CM | POA: Diagnosis present

## 2015-08-20 DIAGNOSIS — Z8541 Personal history of malignant neoplasm of cervix uteri: Secondary | ICD-10-CM | POA: Diagnosis not present

## 2015-08-20 DIAGNOSIS — R208 Other disturbances of skin sensation: Secondary | ICD-10-CM | POA: Diagnosis not present

## 2015-08-20 DIAGNOSIS — R599 Enlarged lymph nodes, unspecified: Secondary | ICD-10-CM | POA: Diagnosis not present

## 2015-08-20 DIAGNOSIS — E538 Deficiency of other specified B group vitamins: Secondary | ICD-10-CM | POA: Diagnosis present

## 2015-08-20 DIAGNOSIS — R109 Unspecified abdominal pain: Secondary | ICD-10-CM | POA: Diagnosis not present

## 2015-08-20 DIAGNOSIS — Z79899 Other long term (current) drug therapy: Secondary | ICD-10-CM | POA: Diagnosis not present

## 2015-08-20 DIAGNOSIS — R61 Generalized hyperhidrosis: Secondary | ICD-10-CM | POA: Diagnosis present

## 2015-08-20 DIAGNOSIS — R110 Nausea: Secondary | ICD-10-CM | POA: Diagnosis not present

## 2015-08-20 DIAGNOSIS — C8370 Burkitt lymphoma, unspecified site: Secondary | ICD-10-CM | POA: Diagnosis not present

## 2015-08-20 DIAGNOSIS — R200 Anesthesia of skin: Secondary | ICD-10-CM | POA: Diagnosis present

## 2015-08-20 DIAGNOSIS — D6182 Myelophthisis: Secondary | ICD-10-CM | POA: Diagnosis not present

## 2015-08-20 DIAGNOSIS — R112 Nausea with vomiting, unspecified: Secondary | ICD-10-CM | POA: Diagnosis present

## 2015-08-20 DIAGNOSIS — C8371 Burkitt lymphoma, lymph nodes of head, face, and neck: Secondary | ICD-10-CM | POA: Diagnosis not present

## 2015-08-20 DIAGNOSIS — R630 Anorexia: Secondary | ICD-10-CM | POA: Diagnosis not present

## 2015-08-20 DIAGNOSIS — Z5111 Encounter for antineoplastic chemotherapy: Secondary | ICD-10-CM | POA: Diagnosis not present

## 2015-08-20 DIAGNOSIS — Z8711 Personal history of peptic ulcer disease: Secondary | ICD-10-CM | POA: Diagnosis not present

## 2015-08-20 DIAGNOSIS — E875 Hyperkalemia: Secondary | ICD-10-CM | POA: Diagnosis not present

## 2015-08-20 DIAGNOSIS — Z87891 Personal history of nicotine dependence: Secondary | ICD-10-CM | POA: Diagnosis not present

## 2015-08-20 DIAGNOSIS — T380X5A Adverse effect of glucocorticoids and synthetic analogues, initial encounter: Secondary | ICD-10-CM | POA: Diagnosis present

## 2015-08-20 DIAGNOSIS — D696 Thrombocytopenia, unspecified: Secondary | ICD-10-CM | POA: Diagnosis not present

## 2015-08-20 DIAGNOSIS — K219 Gastro-esophageal reflux disease without esophagitis: Secondary | ICD-10-CM | POA: Diagnosis present

## 2015-08-20 DIAGNOSIS — G039 Meningitis, unspecified: Secondary | ICD-10-CM | POA: Diagnosis not present

## 2015-08-20 DIAGNOSIS — E559 Vitamin D deficiency, unspecified: Secondary | ICD-10-CM | POA: Diagnosis present

## 2015-08-20 DIAGNOSIS — R531 Weakness: Secondary | ICD-10-CM | POA: Diagnosis not present

## 2015-08-20 DIAGNOSIS — E876 Hypokalemia: Secondary | ICD-10-CM | POA: Diagnosis present

## 2015-08-20 DIAGNOSIS — D61818 Other pancytopenia: Secondary | ICD-10-CM | POA: Diagnosis present

## 2015-08-20 DIAGNOSIS — R1032 Left lower quadrant pain: Secondary | ICD-10-CM | POA: Diagnosis not present

## 2015-08-20 DIAGNOSIS — D6959 Other secondary thrombocytopenia: Secondary | ICD-10-CM | POA: Diagnosis present

## 2015-08-20 LAB — CSF CELL COUNT WITH DIFFERENTIAL
RBC COUNT CSF: 2925 /mm3 — AB
RBC COUNT CSF: 5 /mm3 — AB
TUBE #: 1
Tube #: 4
WBC CSF: 2 /mm3 (ref 0–5)
WBC, CSF: 5 /mm3 (ref 0–5)

## 2015-08-20 LAB — CBC
HEMATOCRIT: 33.8 % — AB (ref 36.0–46.0)
HEMOGLOBIN: 10.9 g/dL — AB (ref 12.0–15.0)
MCH: 28.4 pg (ref 26.0–34.0)
MCHC: 32.2 g/dL (ref 30.0–36.0)
MCV: 88 fL (ref 78.0–100.0)
Platelets: 41 10*3/uL — ABNORMAL LOW (ref 150–400)
RBC: 3.84 MIL/uL — AB (ref 3.87–5.11)
RDW: 17 % — ABNORMAL HIGH (ref 11.5–15.5)
WBC: 6.7 10*3/uL (ref 4.0–10.5)

## 2015-08-20 LAB — HEPATITIS B CORE ANTIBODY, TOTAL: Hep B Core Total Ab: NEGATIVE

## 2015-08-20 LAB — HCV COMMENT:

## 2015-08-20 LAB — PROTEIN AND GLUCOSE, CSF
Glucose, CSF: 44 mg/dL (ref 40–70)
Total  Protein, CSF: 40 mg/dL (ref 15–45)

## 2015-08-20 LAB — HTLV I+II ANTIBODIES, (EIA), BLD: HTLV I/II AB: NEGATIVE

## 2015-08-20 LAB — BETA 2 MICROGLOBULIN, SERUM: Beta-2 Microglobulin: 4.5 mg/L — ABNORMAL HIGH (ref 0.6–2.4)

## 2015-08-20 LAB — HEPATITIS B SURFACE ANTIBODY,QUALITATIVE: Hep B S Ab: NONREACTIVE

## 2015-08-20 LAB — HEPATITIS B CORE ANTIBODY, IGM: HEP B C IGM: NEGATIVE

## 2015-08-20 LAB — HEPATITIS C ANTIBODY (REFLEX)

## 2015-08-20 LAB — KAPPA/LAMBDA LIGHT CHAINS
KAPPA, LAMDA LIGHT CHAIN RATIO: 1.01 (ref 0.26–1.65)
Kappa free light chain: 11.2 mg/L (ref 3.30–19.40)
LAMDA FREE LIGHT CHAINS: 11.08 mg/L (ref 5.71–26.30)

## 2015-08-20 LAB — IGG, IGA, IGM
IGA: 81 mg/dL (ref 64–422)
IGG (IMMUNOGLOBIN G), SERUM: 307 mg/dL — AB (ref 700–1600)
IgM, Serum: 27 mg/dL (ref 26–217)

## 2015-08-20 LAB — HAPTOGLOBIN: Haptoglobin: 211 mg/dL — ABNORMAL HIGH (ref 34–200)

## 2015-08-20 LAB — PATHOLOGIST SMEAR REVIEW

## 2015-08-20 LAB — HEPATITIS B SURFACE ANTIGEN: HEP B S AG: NEGATIVE

## 2015-08-20 LAB — HEPATITIS A ANTIBODY, IGM: Hep A IgM: NEGATIVE

## 2015-08-20 LAB — HEPATITIS A ANTIBODY, TOTAL: Hep A Total Ab: POSITIVE — AB

## 2015-08-20 LAB — VITAMIN B12: VITAMIN B 12: 782 pg/mL (ref 180–914)

## 2015-08-20 MED ORDER — DEXAMETHASONE SODIUM PHOSPHATE 4 MG/ML IJ SOLN
4.0000 mg | Freq: Four times a day (QID) | INTRAMUSCULAR | Status: DC
  Administered 2015-08-20 – 2015-08-27 (×27): 4 mg via INTRAVENOUS
  Filled 2015-08-20 (×27): qty 1

## 2015-08-20 MED ORDER — HALOPERIDOL LACTATE 5 MG/ML IJ SOLN: 2.0000 mg | Freq: Once | INTRAMUSCULAR | Status: DC

## 2015-08-20 MED ORDER — ALLOPURINOL 100 MG PO TABS
100.0000 mg | ORAL_TABLET | Freq: Two times a day (BID) | ORAL | Status: DC
  Administered 2015-08-20 – 2015-08-31 (×22): 100 mg via ORAL
  Filled 2015-08-20 (×23): qty 1

## 2015-08-20 NOTE — Progress Notes (Signed)
Subjective: [name].  Patient denies pain or nausea.  She spoke with the Oncologist yesterday and she understands that she will be getting a lot of tests performed today.  She has no questions at this time regarding her treatment.  Objective: Vital signs in last 24 hours: Filed Vitals:   08/19/15 0541 08/19/15 0544 08/19/15 1407 08/20/15 0623  BP: 123/57  134/58 132/51  Pulse: 66  63 62  Temp: 97.8 F (36.6 C)  98.4 F (36.9 C) 98.2 F (36.8 C)  TempSrc: Oral  Oral Oral  Resp: $Remo'16  18 17  'zsOSf$ Height:      Weight:  179 lb 8 oz (81.421 kg)    SpO2: 91%  91% 90%   Weight change:   Intake/Output Summary (Last 24 hours) at 08/20/15 1313 Last data filed at 08/20/15 0900  Gross per 24 hour  Intake    510 ml  Output   1350 ml  Net   -840 ml   Physical Exam  Constitutional: She is oriented to person, place, and time and well-developed, well-nourished, and in no distress. No distress.  Elderly female, lying in bed.  Appears fatigued, but not in acute distress.  HENT:  Head: Normocephalic and atraumatic.  Eyes: EOM are normal. No scleral icterus.  Neck: No JVD present. No tracheal deviation present.  Cardiovascular: Normal rate, regular rhythm, normal heart sounds and intact distal pulses.   Pulmonary/Chest: Effort normal and breath sounds normal. No respiratory distress. She has no wheezes.  Abdominal: Soft. She exhibits no distension. There is no rebound and no guarding.  Minimally tender to deep palpation in LLQ.  Musculoskeletal: She exhibits no edema.  Neurological: She is alert and oriented to person, place, and time.  Skin: Skin is warm and dry. No rash noted. She is not diaphoretic.    Lab Results: Basic Metabolic Panel:  Recent Labs Lab 08/18/15 1408 08/18/15 2128 08/19/15 0458  NA 137  --  142  K 3.1*  --  3.7  CL 100*  --  105  CO2 22  --  25  GLUCOSE 88  --  90  BUN 13  --  10  CREATININE 1.30*  --  1.14*  CALCIUM 9.0  --  9.0  MG  --  1.7  --    Liver  Function Tests:  Recent Labs Lab 08/18/15 1408 08/19/15 0458  AST 69* 61*  ALT 16 16  ALKPHOS 95 88  BILITOT 0.8 0.6  PROT 5.7* 5.4*  ALBUMIN 3.2* 2.9*    Recent Labs Lab 08/18/15 1408  LIPASE 23   No results for input(s): AMMONIA in the last 168 hours. CBC:  Recent Labs Lab 08/18/15 1408 08/18/15 1824 08/19/15 0458  WBC 7.0  --  7.9  NEUTROABS 4.0  --  4.1  HGB 12.6  --  11.2*  HCT 38.0  --  34.3*  MCV 87.4  --  88.2  PLT 34* 31* 61*   Cardiac Enzymes: No results for input(s): CKTOTAL, CKMB, CKMBINDEX, TROPONINI in the last 168 hours. BNP: No results for input(s): PROBNP in the last 168 hours. D-Dimer:  Recent Labs Lab 08/19/15 1905  DDIMER 1.94*   CBG: No results for input(s): GLUCAP in the last 168 hours. Hemoglobin A1C: No results for input(s): HGBA1C in the last 168 hours. Fasting Lipid Panel: No results for input(s): CHOL, HDL, LDLCALC, TRIG, CHOLHDL, LDLDIRECT in the last 168 hours. Thyroid Function Tests: No results for input(s): TSH, T4TOTAL, FREET4, T3FREE, THYROIDAB in  the last 168 hours. Coagulation:  Recent Labs Lab 08/18/15 1824 08/19/15 0458  LABPROT 14.8 15.7*  INR 1.15 1.23   Anemia Panel:  Recent Labs Lab 08/19/15 1905 08/20/15 0727  VITAMINB12  --  782  RETICCTPCT 0.7  --    Urine Drug Screen: Drugs of Abuse  No results found for: LABOPIA, COCAINSCRNUR, LABBENZ, AMPHETMU, THCU, LABBARB  Alcohol Level: No results for input(s): ETH in the last 168 hours. Urinalysis:  Recent Labs Lab 08/18/15 1600  COLORURINE YELLOW  LABSPEC 1.015  PHURINE 6.0  GLUCOSEU NEGATIVE  HGBUR TRACE*  BILIRUBINUR NEGATIVE  KETONESUR 15*  PROTEINUR NEGATIVE  UROBILINOGEN 0.2  NITRITE NEGATIVE  LEUKOCYTESUR NEGATIVE   Misc. Labs:   Micro Results: No results found for this or any previous visit (from the past 240 hour(s)). Studies/Results: Ct Chest Wo Contrast  08/19/2015   CLINICAL DATA:  81 year old female with abnormal CT  Abdomen and Pelvis, possible lymphoma. Initial encounter.  EXAM: CT CHEST WITHOUT CONTRAST  TECHNIQUE: Multidetector CT imaging of the chest was performed following the standard protocol without IV contrast.  COMPARISON:  CT Abdomen and Pelvis 08/18/2015. Chest and abdominal radiographs 08/18/2015.  FINDINGS: Noncontrast exam. Stable abnormal inferior posterior mediastinum and retrocrural soft tissue as described recently. No pericardial effusion. No pleural effusion. Mediastinal and axillary lymph nodes are normal. Thoracic inlet lymph nodes are normal. Calcified aortic and coronary artery atherosclerosis.  Major airways are patent. There are occasional calcified granulomas. There is right upper lobe bronchiectasis with mild patchy peribronchial opacity along the major fissure (series 305, image 16). Mild tree-in-bud nodular opacity along the inferior lateral right upper lobe near the minor fissure (image 24). Minor dependent atelectasis.  Degenerative changes in the thoracic spine. No acute or suspicious osseous lesion in the chest.  IMPRESSION: 1. Abnormal posterior mediastinal/retrocrural soft tissue as seen on the recent CT Abdomen and Pelvis. Mediastinal, axillary, and thoracic inlet lymph nodes are normal. 2. Right upper lobe bronchiectasis and peripheral / peribronchial opacity compatible with distal airway infection or less likely scarring.   Electronically Signed   By: Genevie Ann M.D.   On: 08/19/2015 23:39   Mr Kristen Baker FU Contrast  08/19/2015   CLINICAL DATA:  Headache.  Possible lymphoma.  EXAM: MRI HEAD WITHOUT AND WITH CONTRAST  TECHNIQUE: Multiplanar, multiecho pulse sequences of the brain and surrounding structures were obtained without and with intravenous contrast.  CONTRAST:  32mL MULTIHANCE GADOBENATE DIMEGLUMINE 529 MG/ML IV SOLN  COMPARISON:  None.  FINDINGS: Ventricle size normal. Cerebral volume normal. Pituitary normal in size. Craniocervical junction normal.  Negative for acute infarct.  Minimal hyperintensity in the periventricular white matter may represent chronic ischemia.  Negative for hemorrhage or fluid collection. Negative for edema in the brain  Postcontrast imaging reveals leptomeningeal enhancement diffusely and bilaterally. There is mild smooth enhancement of the meninges without nodularity. No enhancing mass lesion in the brain.  IMPRESSION: Diffuse leptomeningeal enhancement. Given the history, this is concerning for lymphomatous involvement. Lumbar puncture with cytology recommended.  Negative for acute infarct or mass lesion.   Electronically Signed   By: Franchot Gallo M.D.   On: 08/19/2015 20:47   Ct Abdomen Pelvis W Contrast  08/18/2015   CLINICAL DATA:  81 year old female with lower abdominal pain nausea and vomiting for 1 month. Initial encounter.  EXAM: CT ABDOMEN AND PELVIS WITH CONTRAST  TECHNIQUE: Multidetector CT imaging of the abdomen and pelvis was performed using the standard protocol following bolus administration of intravenous contrast.  CONTRAST:  80mL OMNIPAQUE IOHEXOL 300 MG/ML SOLN, 162mL OMNIPAQUE IOHEXOL 300 MG/ML SOLN  COMPARISON:  [facility] Chest CTA 11/20/2012. Acute abdominal series from today  FINDINGS: Mild respiratory motion artifact at the lung bases. There are chronic surgical clips about the distal thoracic esophagus and gastroesophageal junction. New since 2014 in the posterior mediastinum and tracking toward the retrocrural space is abnormal confluent soft tissue anterior to the thoracic spine and inseparable from the medial wall of the descending thoracic aorta measuring 3 x 44 x 54 mm (AP by transverse by CC). This seems to be separate from both the aorta and esophagus. There is no anterior thoracic spine erosion. Calcified plaque along this segment of the aorta.  No associated pericardial or pleural effusion. Mild bronchiectasis at both lung bases with no confluent pulmonary opacity.  Degenerative changes in the spine. Chronic 10 mm  lucent area in the left T12 vertebral body is stable and most resembled hemangioma in 2014. No acute or suspicious osseous lesion is identified however, there is subtle increased ventral epidural soft tissue seen in the sacrum -sagittal image 73.  Mild to moderate nonspecific presacral stranding. No pelvic free fluid. Negative rectum with retained stool. Uterus surgically absent. Adnexa within normal limits. Numerous pelvic phleboliths. Negative urinary bladder.  Redundant sigmoid colon. Proximal sigmoid and left colon diverticulosis with no active inflammation identified. Negative transverse colon. Negative right colon. Ileocecal valve lipoma incidentally noted. Appendix diminutive or absent. Negative terminal ileum. No dilated or abnormal small bowel loops. Occasional surgical clips in the greater omentum. Diminutive stomach. Duodenum within normal limits.  Major arterial structures are patent in the abdomen and pelvis with fairly extensive calcified aortic atherosclerosis. Portal venous system appears to be patent.  Retroperitoneal lymphadenopathy maximal at the lower lumbar spine level anterior to the IVC measuring up to 22 mm short axis. Numerous increased para renal and other retroperitoneal space nodes are individually up to 14 mm short axis. Mesenteric nodes in the abdomen and pelvis have a more normal appearance. No pelvic sidewall or inguinal lymphadenopathy identified.  Solitary small nonspecific low-density area in the right hepatic lobe measuring 10 mm on series 2, image 20, favor benign. Gallbladder not identified and felt to be surgically absent. No splenomegaly or splenic lesion. Negative pancreas and adrenal glands. Bilateral renal enhancement and contrast excretion within normal limits. No abdominal free fluid.  IMPRESSION: 1. Retroperitoneal and lower posterior mediastinal / retrocrural soft tissue masses most compatible with lymphadenopathy, and most suggestive of Lymphoma. Some of these might be  amenable to CT-guided biopsy, uncertain. 2. Subtle increased sacral epidural soft tissue, but no destructive osseous lesion identified. Nonspecific presacral stranding. Metastatic disease to the spine not excluded.   Electronically Signed   By: Genevie Ann M.D.   On: 08/18/2015 18:52   Dg Abd Acute W/chest  08/18/2015   CLINICAL DATA:  Chest pain and abdominal pain.  EXAM: DG ABDOMEN ACUTE W/ 1V CHEST  COMPARISON:  Chest x-ray dated 12/12/2012 performed at [place]: Heart size and pulmonary vascularity are normal and the lungs are clear. Surgical clips at the gastroesophageal junction. Slight thoracolumbar scoliosis.  No free air or free fluid in the abdomen. Bowel gas pattern is normal. Surgical clips and staples in the abdomen. Multiple phleboliths in the pelvis. No acute osseous abnormality.  IMPRESSION: Negative abdominal radiographs.  No acute cardiopulmonary disease.   Electronically Signed   By: Lorriane Shire M.D.   On: 08/18/2015 17:17   Medications: I have  reviewed the patient's current medications. Scheduled Meds: . pantoprazole  40 mg Oral Daily  . Vitamin D (Ergocalciferol)  50,000 Units Oral Q7 days   Continuous Infusions:  PRN Meds:.iohexol, promethazine, traMADol Assessment/Plan: Principal Problem:   Retroperitoneal lymphadenopathy Active Problems:   Thrombocytopenia (HCC)   Weight loss   Generalized weakness   Vitamin B12 deficiency   GERD (gastroesophageal reflux disease)   Hx of gastric ulcer   Hx of cervical cancer   Hypokalemia   Headache  Kristen Baker is a pleasant 81 year old lady presenting with a one-month history of B-symptoms, lower band-like abdominal pain, headaches, and chin numbness, found to have severe thrombocytopenia and retroperitoneal lymphadenopathy concerning for lymphoma and possibly a spinal metastasis.   Retroperitoneal lymphadenopathy: Patient with undiagnosed etiology of persistent anemia and now thrombocytopenia has had B symptoms  and imaging suggestive of lymphoma. Notes from [facility] in July describe treatment for persistent anemia with normal platelet count.  Therefore, her current thrombocytopenia appears to be a more acute development.   Given her prominent lymphadenopathy without splenomegaly, there is now concern for bone marrow involvement.  Patient also complaining of HA and bilateral numb chin.  Numb chin syndrome is often 2/2 CNS involvement of lymphoma, and MRI concerning for leptomeningeal involvement.  Heme-Onc has been consulted with ddx including DLBCL, Burkitts, Mantle cell, and ALL.  - Heme/Onc consult - CT chest: lymphadenopathy with possible distal airway infection (no pulmonary symptoms at this time; will CTM for fever or pulmonary complaint) - MRI brain: diffuse leptomeningeal enhancement [ ]  LN biopsy [ ]  Bone marrow biopsy [ ]  Lumbar puncture [ ]  Peripheral blood flow cytometry - Dexamethasone 4 mg IV q6hours after procedures are finished - Likely needs PET/CT for complete staging  Thrombocytopenia: Likely 2/2 etiology underlying retroperitoneal LAD.  She is s/p 1 U plts with appropriate response.  Will continue to monitor pending Heme/Onc evaluation. -Peripheral smear with left shift and polychromasia, but no schistocytes -EBV -Hepatitis labs negative -HIV negative -HTLV  Abdominal pain and nausea: Per above, likely related to her lymphadenopathy. Urinalysis was clean and she has been having normal bowel movements.  -Tramadol as needed - Phenergan as needed  Headache: Likely 2/2 etiology underlying her retroperitoneal LAD.  Considering contrast MRI and LP as above. -Tramadol as needed - Dexamethasone as above once procedures are performed  Gastroesophageal reflux disease: Not an acute issue. -Continue omeprazole  Dispo: Disposition is deferred at this time, awaiting improvement of current medical problems.    The patient does have a current PCP (Dr. Cyndy Freeze, [place]) and does need an [facility] hospital follow-up appointment after discharge.  The patient does not have transportation limitations that hinder transportation to clinic appointments.  .Services Needed at time of discharge: Y = Yes, Blank = No PT:   OT:   RN:   Equipment:   Other:       Iline Oven, MD 08/20/2015, 1:13 PM

## 2015-08-20 NOTE — Progress Notes (Signed)
Patient ID: [name], female   DOB: 1933-05-20, 81 y.o.   MRN: [id number]  Date: 08/20/2015  Patient name: Kristen Baker  Medical record number: [id number]  Date of birth: [birth year]   This patient's plan of care was discussed with the house staff. Please see their note for complete details. I concur with their findings. Ms. Donnell remains very weak but her nausea has responded well to promethazine. 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 She will undergo lymph node and bone marrow biopsies today as well as lumbar puncture then start on dexamethasone pending results of those studies. I greatly appreciate Dr. Grier Mitts assistance.  Michel Bickers, MD 08/20/2015, 11:52 AM

## 2015-08-20 NOTE — H&P (Signed)
Chief Complaint: Patient was seen in consultation today for thrombocytopenia and lymphadenopathy  Chief Complaint  Patient presents with  . Generalized Body Aches  . Numbness   at the request of Dr. Irene Limbo  Referring Physician(s): Dr. Irene Limbo  History of Present Illness: Kristen Baker is a 81 y.o. female presented with abdominal pain and headaches, CT revealed retroperitoneal lymphadenopathy. She also complains of 2-3 month weight loss with night sweats and fatigue. She is found to have thrombocytopenia, plt 61k and MRI brain findings concern for leptomeningeal involvement. Oncology has seen the patient and IR received request for image guided bone marrow biopsy and lymph node biopsy. She denies any chest pain, shortness of breath or palpitations. The patient denies any history of sleep apnea or chronic oxygen use. She has previously tolerated sedation without complications.    Past Medical History  Diagnosis Date  . Cancer [facility])     cervical cancer    Past Surgical History  Procedure Laterality Date  . Cholecystectomy    . Tonsillectomy    . Abdominal hysterectomy    . Abdominal surgery      2-3 rd of colon removed    Allergies: Cabbage; Onion; Shellfish allergy; and Sulfa antibiotics  Medications: Prior to Admission medications   Medication Sig Start Date End Date Taking? Authorizing Provider  DimenhyDRINATE (MOTION SICKNESS PO) Take 1 tablet by mouth daily as needed. For motion sickness per patient   Yes Historical Provider, MD  naproxen sodium (ANAPROX) 220 MG tablet Take 220 mg by mouth daily as needed. For pain   Yes Historical Provider, MD  omeprazole (PRILOSEC) 40 MG capsule Take 40 mg by mouth daily.   Yes Historical Provider, MD  Vitamin D, Ergocalciferol, (DRISDOL) 50000 UNITS CAPS capsule Take 50,000 Units by mouth every 7 (seven) days. Take on Tuesdays   Yes Historical Provider, MD     History reviewed. No pertinent family history.  Social History    Social History  . Marital Status: Married    Spouse Name: N/A  . Number of Children: N/A  . Years of Education: N/A   Social History Main Topics  . Smoking status: Never Smoker   . Smokeless tobacco: None  . Alcohol Use: No  . Drug Use: No  . Sexual Activity: Not Asked   Other Topics Concern  . None   Social History Narrative   Review of Systems: A 12 point ROS discussed and pertinent positives are indicated in the HPI above.  All other systems are negative.  Review of Systems  Vital Signs: BP 132/51 mmHg  Pulse 62  Temp(Src) 98.2 F (36.8 C) (Oral)  Resp 17  Ht 5' 7.2" (1.707 m)  Wt 179 lb 8 oz (81.421 kg)  BMI 27.94 kg/m2  SpO2 90%  Physical Exam  Constitutional: She is oriented to person, place, and time. No distress.  HENT:  Head: Normocephalic and atraumatic.  Cardiovascular: Normal rate and regular rhythm.  Exam reveals no gallop and no friction rub.   No murmur heard. Pulmonary/Chest: Effort normal and breath sounds normal. No respiratory distress. She has no wheezes. She has no rales.  Abdominal: Soft. Bowel sounds are normal. She exhibits no distension. There is no tenderness.  Neurological: She is alert and oriented to person, place, and time.  Skin: She is not diaphoretic.    Mallampati Score:  MD Evaluation Airway: WNL Heart: WNL Abdomen: WNL Chest/ Lungs: WNL ASA  Classification: 3 Mallampati/Airway Score: Two  Imaging: Ct Chest  Wo Contrast  08/19/2015   CLINICAL DATA:  81 year old female with abnormal CT Abdomen and Pelvis, possible lymphoma. Initial encounter.  EXAM: CT CHEST WITHOUT CONTRAST  TECHNIQUE: Multidetector CT imaging of the chest was performed following the standard protocol without IV contrast.  COMPARISON:  CT Abdomen and Pelvis 08/18/2015. Chest and abdominal radiographs 08/18/2015.  FINDINGS: Noncontrast exam. Stable abnormal inferior posterior mediastinum and retrocrural soft tissue as described recently. No pericardial  effusion. No pleural effusion. Mediastinal and axillary lymph nodes are normal. Thoracic inlet lymph nodes are normal. Calcified aortic and coronary artery atherosclerosis.  Major airways are patent. There are occasional calcified granulomas. There is right upper lobe bronchiectasis with mild patchy peribronchial opacity along the major fissure (series 305, image 16). Mild tree-in-bud nodular opacity along the inferior lateral right upper lobe near the minor fissure (image 24). Minor dependent atelectasis.  Degenerative changes in the thoracic spine. No acute or suspicious osseous lesion in the chest.  IMPRESSION: 1. Abnormal posterior mediastinal/retrocrural soft tissue as seen on the recent CT Abdomen and Pelvis. Mediastinal, axillary, and thoracic inlet lymph nodes are normal. 2. Right upper lobe bronchiectasis and peripheral / peribronchial opacity compatible with distal airway infection or less likely scarring.   Electronically Signed   By: Genevie Ann M.D.   On: 08/19/2015 23:39   [name] ZO Contrast  08/19/2015   CLINICAL DATA:  Headache.  Possible lymphoma.  EXAM: MRI HEAD WITHOUT AND WITH CONTRAST  TECHNIQUE: Multiplanar, multiecho pulse sequences of the brain and surrounding structures were obtained without and with intravenous contrast.  CONTRAST:  45m MULTIHANCE GADOBENATE DIMEGLUMINE 529 MG/ML IV SOLN  COMPARISON:  None.  FINDINGS: Ventricle size normal. Cerebral volume normal. Pituitary normal in size. Craniocervical junction normal.  Negative for acute infarct. Minimal hyperintensity in the periventricular white matter may represent chronic ischemia.  Negative for hemorrhage or fluid collection. Negative for edema in the brain  Postcontrast imaging reveals leptomeningeal enhancement diffusely and bilaterally. There is mild smooth enhancement of the meninges without nodularity. No enhancing mass lesion in the brain.  IMPRESSION: Diffuse leptomeningeal enhancement. Given the history, this is  concerning for lymphomatous involvement. Lumbar puncture with cytology recommended.  Negative for acute infarct or mass lesion.   Electronically Signed   By: CFranchot GalloM.D.   On: 08/19/2015 20:47   Ct Abdomen Pelvis W Contrast  08/18/2015   CLINICAL DATA:  81year old female with lower abdominal pain nausea and vomiting for 1 month. Initial encounter.  EXAM: CT ABDOMEN AND PELVIS WITH CONTRAST  TECHNIQUE: Multidetector CT imaging of the abdomen and pelvis was performed using the standard protocol following bolus administration of intravenous contrast.  CONTRAST:  225mOMNIPAQUE IOHEXOL 300 MG/ML SOLN, 10030mMNIPAQUE IOHEXOL 300 MG/ML SOLN  [facility] Acute abdominal series from today  FINDINGS: Mild respiratory motion artifact at the lung bases. There are chronic surgical clips about the distal thoracic esophagus and gastroesophageal junction. New since 2014 in the posterior mediastinum and tracking toward the retrocrural space is abnormal confluent soft tissue anterior to the thoracic spine and inseparable from the medial wall of the descending thoracic aorta measuring 3 x 44 x 54 mm (AP by transverse by CC). This seems to be separate from both the aorta and esophagus. There is no anterior thoracic spine erosion. Calcified plaque along this segment of the aorta.  No associated pericardial or pleural effusion. Mild bronchiectasis at both lung bases with no confluent pulmonary opacity.  Degenerative  changes in the spine. Chronic 10 mm lucent area in the left T12 vertebral body is stable and most resembled hemangioma in 2014. No acute or suspicious osseous lesion is identified however, there is subtle increased ventral epidural soft tissue seen in the sacrum -sagittal image 73.  Mild to moderate nonspecific presacral stranding. No pelvic free fluid. Negative rectum with retained stool. Uterus surgically absent. Adnexa within normal limits. Numerous pelvic phleboliths.  Negative urinary bladder.  Redundant sigmoid colon. Proximal sigmoid and left colon diverticulosis with no active inflammation identified. Negative transverse colon. Negative right colon. Ileocecal valve lipoma incidentally noted. Appendix diminutive or absent. Negative terminal ileum. No dilated or abnormal small bowel loops. Occasional surgical clips in the greater omentum. Diminutive stomach. Duodenum within normal limits.  Major arterial structures are patent in the abdomen and pelvis with fairly extensive calcified aortic atherosclerosis. Portal venous system appears to be patent.  Retroperitoneal lymphadenopathy maximal at the lower lumbar spine level anterior to the IVC measuring up to 22 mm short axis. Numerous increased para renal and other retroperitoneal space nodes are individually up to 14 mm short axis. Mesenteric nodes in the abdomen and pelvis have a more normal appearance. No pelvic sidewall or inguinal lymphadenopathy identified.  Solitary small nonspecific low-density area in the right hepatic lobe measuring 10 mm on series 2, image 20, favor benign. Gallbladder not identified and felt to be surgically absent. No splenomegaly or splenic lesion. Negative pancreas and adrenal glands. Bilateral renal enhancement and contrast excretion within normal limits. No abdominal free fluid.  IMPRESSION: 1. Retroperitoneal and lower posterior mediastinal / retrocrural soft tissue masses most compatible with lymphadenopathy, and most suggestive of Lymphoma. Some of these might be amenable to CT-guided biopsy, uncertain. 2. Subtle increased sacral epidural soft tissue, but no destructive osseous lesion identified. Nonspecific presacral stranding. Metastatic disease to the spine not excluded.   Electronically Signed   By: Genevie Ann M.D.   On: 08/18/2015 18:52   Dg Abd Acute W/chest  08/18/2015   CLINICAL DATA:  Chest pain and abdominal pain.  EXAM: DG ABDOMEN ACUTE W/ 1V CHEST  COMPARISON:  Chest x-ray dated  12/12/2012 performed at [place]: Heart size and pulmonary vascularity are normal and the lungs are clear. Surgical clips at the gastroesophageal junction. Slight thoracolumbar scoliosis.  No free air or free fluid in the abdomen. Bowel gas pattern is normal. Surgical clips and staples in the abdomen. Multiple phleboliths in the pelvis. No acute osseous abnormality.  IMPRESSION: Negative abdominal radiographs.  No acute cardiopulmonary disease.   Electronically Signed   By: Lorriane Shire M.D.   On: 08/18/2015 17:17    Labs:  CBC:  Recent Labs  08/18/15 1408 08/18/15 1824 08/19/15 0458  WBC 7.0  --  7.9  HGB 12.6  --  11.2*  HCT 38.0  --  34.3*  PLT 34* 31* 61*    COAGS:  Recent Labs  08/18/15 1824 08/19/15 0458  INR 1.15 1.23  APTT 33  --     BMP:  Recent Labs  08/18/15 1408 08/19/15 0458  NA 137 142  K 3.1* 3.7  CL 100* 105  CO2 22 25  GLUCOSE 88 90  BUN 13 10  CALCIUM 9.0 9.0  CREATININE 1.30* 1.14*  GFRNONAA 38* 44*  GFRAA 44* 51*    LIVER FUNCTION TESTS:  Recent Labs  08/18/15 1408 08/19/15 0458  BILITOT 0.8 0.6  AST 69* 61*  ALT 16 16  ALKPHOS 95 88  PROT 5.7* 5.4*  ALBUMIN 3.2* 2.9*    Assessment and Plan: Abdominal pain, CT revealed retroperitoneal lymphadenopathy Weight loss with night sweats and fatigue Thrombocytopenia, plt 61k  MRI brain findings concern for leptomeningeal involvement- started on IV dexamethasone  Oncology has seen and requested IR procedures: image guided bone marrow biopsy and lymph node biopsy with sedation The patient will be NPO after midnight, no blood thinners taken, labs and vitals have been reviewed. Risks and Benefits of bone marrow biopsy and periaortic lymph node biopsy with sedation discussed with the patient including, but not limited to bleeding, infection, damage to adjacent structures or low yield requiring additional tests. All of the patient's questions were answered, patient is  agreeable to proceed. Consent signed and in chart.   Thank you for this interesting consult.  I greatly enjoyed meeting Kristen Baker and look forward to participating in their care.  A copy of this report was sent to the requesting provider on this date.  SignedHedy Jacob 08/20/2015, 1:25 PM   I spent a total of 20 Minutes in face to face in clinical consultation, greater than 50% of which was counseling/coordinating care for lymphadenopathy and thrombocytopenia.

## 2015-08-20 NOTE — Progress Notes (Signed)
Received a call regarding LP order. Per IR, LP must be attempted on the unit or must have platelet count for procedure to be completed in IR. Paged and spoke with Dr. Lovena Le regarding order for LP. MD to call IR. Nursing staff will continue to monitor. Earleen Reaper, RN

## 2015-08-20 NOTE — Procedures (Addendum)
Lumbar Puncture Procedure Note  Pre-operative Diagnosis: leptomeningeal enhancement on MRI, lymphoma  Post-operative Diagnosis: same  Indications: Diagnostic  Procedure Details   Consent: Informed consent was obtained. Risks of the procedure were discussed including: infection, bleeding, pain and headache.  The patient was positioned under sterile conditions. Betadine solution and sterile drapes were utilized. A spinal needle was inserted at the L2 - L3 interspace.  Spinal fluid was obtained and sent to the laboratory.  Findings 36mL of clear spinal fluid was obtained.   Complications:  None; patient tolerated the procedure well.        Condition: stable  Plan Bed rest for 2 hours. Tylenol 650 mg for pain.  Primary team to follow up on results. Risks of bleeding discussed with low platelets  Lamario Mani V. MD [phone]

## 2015-08-20 NOTE — Progress Notes (Signed)
Oncology Short Note  MRI brain results noted. 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 Patient was not having any active headaches when I saw her yesterday. Plan -Urgent LP with routine CSF studies plus cytology plus flow cytometry today. Additionally unit of platelets if required by interventional radiology. -CT-guided bone marrow aspiration and biopsy as well as retroperitoneal lymph node biopsy today. -Would start the patient on dexamethasone 4 mg IV every 6 after doing above studies ASAP. -Definitive therapy based on above workup. Will likely need CNS directed therapies in additional to systemic therapies based on final diagnosis and patient's goals of care/wishes. -If ALL and patient desires aggressive treatment might need to transfer to [place].  -We'll continue to follow daily  Sullivan Lone MD [place] [facility]/Oncology Physician [facility]

## 2015-08-20 NOTE — Progress Notes (Signed)
[name] Kitchen   HEMATOLOGY/ONCOLOGY INPATIENT PROGRESS NOTE  Date of Service: 08/20/2015  Inpatient Attending: .Michel Bickers, MD  SUBJECTIVE  I met with Ms Kristen Baker and her daughter at bedside this evening. She just had her lumbar puncture at bedside. Results pending. Bone marrow biopsy and LN biopsy scheduled for tomorrow AM. Notes persistent chin numbness. Has some headaches this PM but improved with tramadol. Has 5/10 lower back pain radiating along the lower abdominal wall    OBJECTIVE:  Resting in bed  PHYSICAL EXAMINATION: . Filed Vitals:   08/19/15 0544 08/19/15 1407 08/20/15 0623 08/20/15 1413  BP:  134/58 132/51 149/60  Pulse:  63 62 70  Temp:  98.4 F (36.9 C) 98.2 F (36.8 C) 98.4 F (36.9 C)  TempSrc:  Oral Oral Oral  Resp:  _0 Height:      Weight: 179 lb 8 oz (81.421 kg)     SpO2:  91% 90% 91%   Filed Weights   08/18/15 1336 08/18/15 2054 08/19/15 0544  Weight: 174 lb 14.4 oz (79.334 kg) 176 lb (79.833 kg) 179 lb 8 oz (81.421 kg)   .Body mass index is 27.94 kg/(m^2).  GENERAL:alert, in no acute distress and comfortable SKIN: skin color, texture, turgor are normal, no rashes or significant lesions EYES: normal, conjunctiva are pink and non-injected, sclera clear OROPHARYNX:no exudate, no erythema and lips, buccal mucosa, and tongue normal  NECK: supple, no JVD, thyroid normal size, non-tender, without nodularity LYMPH: no palpable lymphadenopathy in the cervical, axillary or inguinal LUNGS: clear to auscultation with normal respiratory effort HEART: regular rate & rhythm, no murmurs and no lower extremity edema ABDOMEN: abdomen soft, non-tender, normoactive bowel sounds  Musculoskeletal: no cyanosis of digits and no clubbing  PSYCH: alert & oriented x 3 with fluent speech NEURO: no focal motor/sensory deficits.   MEDICAL HISTORY:  Past Medical History  Diagnosis Date  . Cancer [facility])     cervical cancer    SURGICAL HISTORY: Past Surgical  History  Procedure Laterality Date  . Cholecystectomy    . Tonsillectomy    . Abdominal hysterectomy    . Abdominal surgery      2-3 rd of colon removed    SOCIAL HISTORY: Social History   Social History  . Marital Status: Married    Spouse Name: N/A  . Number of Children: N/A  . Years of Education: N/A   Occupational History  . Not on file.   Social History Main Topics  . Smoking status: Never Smoker   . Smokeless tobacco: Not on file  . Alcohol Use: No  . Drug Use: No  . Sexual Activity: Not on file   Other Topics Concern  . Not on file   Social History Narrative    FAMILY HISTORY: History reviewed. No pertinent family history.  ALLERGIES:  is allergic to cabbage; onion; shellfish allergy; and sulfa antibiotics.  MEDICATIONS:  Scheduled Meds: . allopurinol  100 mg Oral BID  . pantoprazole  40 mg Oral Daily  . Vitamin D (Ergocalciferol)  50,000 Units Oral Q7 days   Continuous Infusions:  PRN Meds:.iohexol, promethazine, traMADol  REVIEW OF SYSTEMS:    10 Point review of Systems was done is negative except as noted above.   LABORATORY DATA:  I have reviewed the data as listed  . CBC Latest Ref Rng 08/19/2015 08/18/2015 08/18/2015  WBC 4.0 - 10.5 K/uL 7.9 - 7.0  Hemoglobin 12.0 - 15.0 g/dL 11.2(L) - 12.6  Hematocrit 36.0 - 46.0 %  34.3(L) - 38.0  Platelets 150 - 400 K/uL 61(L) 31(L) 34(L)    . CMP Latest Ref Rng 08/19/2015 08/18/2015  Glucose 65 - 99 mg/dL 90 88  BUN 6 - 20 mg/dL 10 13  Creatinine 0.44 - 1.00 mg/dL 1.14(H) 1.30(H)  Sodium 135 - 145 mmol/L 142 137  Potassium 3.5 - 5.1 mmol/L 3.7 3.1(L)  Chloride 101 - 111 mmol/L 105 100(L)  CO2 22 - 32 mmol/L 25 22  Calcium 8.9 - 10.3 mg/dL 9.0 9.0  Total Protein 6.5 - 8.1 g/dL 5.4(L) 5.7(L)  Total Bilirubin 0.3 - 1.2 mg/dL 0.6 0.8  Alkaline Phos 38 - 126 U/L 88 95  AST 15 - 41 U/L 61(H) 69(H)  ALT 14 - 54 U/L 16 16     RADIOGRAPHIC STUDIES: I have personally reviewed the radiological  images as listed and agreed with the findings in the report. Ct Chest Wo Contrast  08/19/2015   CLINICAL DATA:  81 year old female with abnormal CT Abdomen and Pelvis, possible lymphoma. Initial encounter.  EXAM: CT CHEST WITHOUT CONTRAST  TECHNIQUE: Multidetector CT imaging of the chest was performed following the standard protocol without IV contrast.  COMPARISON:  CT Abdomen and Pelvis 08/18/2015. Chest and abdominal radiographs 08/18/2015.  FINDINGS: Noncontrast exam. Stable abnormal inferior posterior mediastinum and retrocrural soft tissue as described recently. No pericardial effusion. No pleural effusion. Mediastinal and axillary lymph nodes are normal. Thoracic inlet lymph nodes are normal. Calcified aortic and coronary artery atherosclerosis.  Major airways are patent. There are occasional calcified granulomas. There is right upper lobe bronchiectasis with mild patchy peribronchial opacity along the major fissure (series 305, image 16). Mild tree-in-bud nodular opacity along the inferior lateral right upper lobe near the minor fissure (image 24). Minor dependent atelectasis.  Degenerative changes in the thoracic spine. No acute or suspicious osseous lesion in the chest.  IMPRESSION: 1. Abnormal posterior mediastinal/retrocrural soft tissue as seen on the recent CT Abdomen and Pelvis. Mediastinal, axillary, and thoracic inlet lymph nodes are normal. 2. Right upper lobe bronchiectasis and peripheral / peribronchial opacity compatible with distal airway infection or less likely scarring.   Electronically Signed   By: Genevie Ann M.D.   On: 08/19/2015 23:39   [name] GL Contrast  08/19/2015   CLINICAL DATA:  Headache.  Possible lymphoma.  EXAM: MRI HEAD WITHOUT AND WITH CONTRAST  TECHNIQUE: Multiplanar, multiecho pulse sequences of the brain and surrounding structures were obtained without and with intravenous contrast.  CONTRAST:  85m MULTIHANCE GADOBENATE DIMEGLUMINE 529 MG/ML IV SOLN  COMPARISON:   None.  FINDINGS: Ventricle size normal. Cerebral volume normal. Pituitary normal in size. Craniocervical junction normal.  Negative for acute infarct. Minimal hyperintensity in the periventricular white matter may represent chronic ischemia.  Negative for hemorrhage or fluid collection. Negative for edema in the brain  Postcontrast imaging reveals leptomeningeal enhancement diffusely and bilaterally. There is mild smooth enhancement of the meninges without nodularity. No enhancing mass lesion in the brain.  IMPRESSION: Diffuse leptomeningeal enhancement. Given the history, this is concerning for lymphomatous involvement. Lumbar puncture with cytology recommended.  Negative for acute infarct or mass lesion.   Electronically Signed   By: CFranchot GalloM.D.   On: 08/19/2015 20:47   Ct Abdomen Pelvis W Contrast  08/18/2015   CLINICAL DATA:  81year old female with lower abdominal pain nausea and vomiting for 1 month. Initial encounter.  EXAM: CT ABDOMEN AND PELVIS WITH CONTRAST  TECHNIQUE: Multidetector CT imaging of the abdomen and pelvis was  performed using the standard protocol following bolus administration of intravenous contrast.  CONTRAST:  48m OMNIPAQUE IOHEXOL 300 MG/ML SOLN, 1039mOMNIPAQUE IOHEXOL 300 MG/ML SOLN  [facility] Acute abdominal series from today  FINDINGS: Mild respiratory motion artifact at the lung bases. There are chronic surgical clips about the distal thoracic esophagus and gastroesophageal junction. New since 2014 in the posterior mediastinum and tracking toward the retrocrural space is abnormal confluent soft tissue anterior to the thoracic spine and inseparable from the medial wall of the descending thoracic aorta measuring 3 x 44 x 54 mm (AP by transverse by CC). This seems to be separate from both the aorta and esophagus. There is no anterior thoracic spine erosion. Calcified plaque along this segment of the aorta.  No associated pericardial  or pleural effusion. Mild bronchiectasis at both lung bases with no confluent pulmonary opacity.  Degenerative changes in the spine. Chronic 10 mm lucent area in the left T12 vertebral body is stable and most resembled hemangioma in 2014. No acute or suspicious osseous lesion is identified however, there is subtle increased ventral epidural soft tissue seen in the sacrum -sagittal image 73.  Mild to moderate nonspecific presacral stranding. No pelvic free fluid. Negative rectum with retained stool. Uterus surgically absent. Adnexa within normal limits. Numerous pelvic phleboliths. Negative urinary bladder.  Redundant sigmoid colon. Proximal sigmoid and left colon diverticulosis with no active inflammation identified. Negative transverse colon. Negative right colon. Ileocecal valve lipoma incidentally noted. Appendix diminutive or absent. Negative terminal ileum. No dilated or abnormal small bowel loops. Occasional surgical clips in the greater omentum. Diminutive stomach. Duodenum within normal limits.  Major arterial structures are patent in the abdomen and pelvis with fairly extensive calcified aortic atherosclerosis. Portal venous system appears to be patent.  Retroperitoneal lymphadenopathy maximal at the lower lumbar spine level anterior to the IVC measuring up to 22 mm short axis. Numerous increased para renal and other retroperitoneal space nodes are individually up to 14 mm short axis. Mesenteric nodes in the abdomen and pelvis have a more normal appearance. No pelvic sidewall or inguinal lymphadenopathy identified.  Solitary small nonspecific low-density area in the right hepatic lobe measuring 10 mm on series 2, image 20, favor benign. Gallbladder not identified and felt to be surgically absent. No splenomegaly or splenic lesion. Negative pancreas and adrenal glands. Bilateral renal enhancement and contrast excretion within normal limits. No abdominal free fluid.  IMPRESSION: 1. Retroperitoneal and lower  posterior mediastinal / retrocrural soft tissue masses most compatible with lymphadenopathy, and most suggestive of Lymphoma. Some of these might be amenable to CT-guided biopsy, uncertain. 2. Subtle increased sacral epidural soft tissue, but no destructive osseous lesion identified. Nonspecific presacral stranding. Metastatic disease to the spine not excluded.   Electronically Signed   By: H Genevie Ann.D.   On: 08/18/2015 18:52   Dg Abd Acute W/chest  08/18/2015   CLINICAL DATA:  Chest pain and abdominal pain.  EXAM: DG ABDOMEN ACUTE W/ 1V CHEST  COMPARISON:  [facility]ity are normal and the lungs are clear. Surgical clips at the gastroesophageal junction. Slight thoracolumbar scoliosis.  No free air or free fluid in the abdomen. Bowel gas pattern is normal. Surgical clips and staples in the abdomen. Multiple phleboliths in the pelvis. No acute osseous abnormality.  IMPRESSION: Negative abdominal radiographs.  No acute cardiopulmonary disease.   Electronically Signed   By: JaJeneen Rinks  Maxwell M.D.   On: 08/18/2015 17:17    ASSESSMENT & PLAN:    81 yo caucasian female with   1) Retroperitoneal and Posterior Mediastinal Lnadenopathy with significantly elevated LDH and leukoerythroblastic picture on peipheral blood smear with multiple large atypical Lymphocytes vs blasts concerning for a high grade lymphoma. Patient has significant type B constitutional symptoms. Likely Stage IV.  High grade lymphoma with secondary CNS meningeal involvement raises the possibility of (DLBCL, Burkitts, ALL, Mantle cell lymphoma as the potential pathologies. 2) Chin numbness due to possible leptomeningeal involvement. Notes headaches on and off with no other overt focal neurological deficits 3) Thrombocytopenia likely related to lymphoma. 4) Leukoerythroblastic picture likely from bone marrow Lymphoma involvement. 4) Abdominal  fatigue/back pain - likely from retroperitoneal LNadenopathy Plan -will need PET/CT for complete staging (as outpatient) -CT guided Retroperitoneal LN biopsy and CT guided bone marrow aspiration and biopsy tomorrow AM. -consider PICC line vs port placement. -f/u CSF fluid results -awaiting peripheral blood flow cytometry results -further management based on tissue diagnosis. -will likely need systemic and CNS directed therapies. -Will start on Allopurinol for LS prophylaxis given high risk of Tumor lysis syndrome. -will start dexamethasone 37m IV q6h. Should not affect her LN biopsy and bone marrow results much overnight. -nutritional consultation -PT/OT evaluation  Plan of care discussed in details with patient and her daughter at bedside.  I spent 40 minutes counseling the patient face to face. The total time spent in the appointment was 45 minutes and more than 50% was on counseling and direct patient cares.    GSullivan LoneMD MMoores MillAAHIVMS SNazareth HospitalCParkridge West HospitalHematology/Oncology Physician [facility]Select [facility] (Office):       [phone](Work cell):  [phone](Fax):           [phone] 08/20/2015 4:29 PM

## 2015-08-20 NOTE — Evaluation (Signed)
Physical Therapy Evaluation Patient Details Name: Kristen Baker MRN: [id number] DOB: 02-Apr-1933 Today's Date: 08/20/2015   History of Present Illness  Patient is a 81 y/o female presens with 1 month history of progressive nausea, vomiting, night sweats, weight loss, lower abdominal pain, headache, chin numbness, and easy brusing. Labs were notable for platelet count of 34; CT of her abdomen and pelvis showed retroperitoneal and lower posterior mediastinal lymphadenopathy suggestive of lymphoma, and a subtle increased sacral epidural soft tissue that cannot be excluded as a spinal metastasis. PMH of cervical ca. Plan for LP 10/11.   Clinical Impression  Patient presents with nausea, pain, weakness and balance deficits impacting safe mobility. Tolerated ambulation with min A for balance/safety. Pt +dyspnea during mobility requiring standing rest breaks during gait training. Pt independent PTA and very active. Has supportive family. Recommend daily ambulation with RN to improve strength/mobility. Will follow acutely to maximize independence and mobility prior to return home.     Follow Up Recommendations Home health PT;Supervision - Intermittent    Equipment Recommendations  Other (comment) (TBD)    Recommendations for Other Services OT consult     Precautions / Restrictions Precautions Precautions: Fall Restrictions Weight Bearing Restrictions: No      Mobility  Bed Mobility Overal bed mobility: Needs Assistance Bed Mobility: Sit to Supine       Sit to supine: Modified independent (Device/Increase time)   General bed mobility comments: Sitting in chair upon P Tarrival. HOB flat, no use of rails.  Transfers Overall transfer level: Needs assistance Equipment used: None Transfers: Sit to/from Stand Sit to Stand: Min guard         General transfer comment: Min guard for safety. Stood from [employer], from toilet x1.   Ambulation/Gait Ambulation/Gait assistance: Min  assist Ambulation Distance (Feet): 150 Feet Assistive device: None (rail) Gait Pattern/deviations: Step-through pattern;Decreased stride length;Drifts right/left     General Gait Details: Forward momentum and increased gait speed noted. Requires use of rail for support and 2 short standing rest breaks. Drifting noted.  Stairs            Wheelchair Mobility    Modified Rankin (Stroke Patients Only)       Balance Overall balance assessment: Needs assistance Sitting-balance support: Feet supported;No upper extremity supported Sitting balance-Leahy Scale: Good     Standing balance support: During functional activity Standing balance-Leahy Scale: Fair                               Pertinent Vitals/Pain Pain Assessment: 0-10 Pain Score: 4  Pain Location: abdomen, head, ears Pain Descriptors / Indicators: Sore;Aching Pain Intervention(s): Monitored during session;Repositioned;Premedicated before session    Home Living Family/patient expects to be discharged to:: Private residence Living Arrangements: Alone Available Help at Discharge: Friend(s);Available PRN/intermittently Type of Home: House Home Access: Stairs to enter Entrance Stairs-Rails: Right Entrance Stairs-Number of Steps: 4 Home Layout: One level Home Equipment: None      Prior Function Level of Independence: Independent         Comments: very active, drives, cooks, cleans.     Hand Dominance        Extremity/Trunk Assessment   Upper Extremity Assessment: Defer to OT evaluation           Lower Extremity Assessment: Generalized weakness         Communication   Communication: No difficulties  Cognition Arousal/Alertness: Awake/alert Behavior During Therapy: WFL for tasks  assessed/performed Overall Cognitive Status: Within Functional Limits for tasks assessed                      General Comments General comments (skin integrity, edema, etc.): Daughter present  in room.    Exercises        Assessment/Plan    PT Assessment Patient needs continued PT services  PT Diagnosis Difficulty walking;Generalized weakness;Acute pain   PT Problem List Decreased strength;Pain;Decreased activity tolerance;Decreased balance;Decreased mobility  PT Treatment Interventions Balance training;Gait training;Stair training;Functional mobility training;Therapeutic activities;Therapeutic exercise;Patient/family education   PT Goals (Current goals can be found in the Care Plan section) Acute Rehab PT Goals Patient Stated Goal: to feel better PT Goal Formulation: With patient Time For Goal Achievement: 09/03/15 Potential to Achieve Goals: Fair    Frequency Min 3X/week   Barriers to discharge Decreased caregiver support Pt lives alone    Co-evaluation               End of Session Equipment Utilized During Treatment: Gait belt Activity Tolerance: Patient limited by fatigue;Patient tolerated treatment well Patient left: in bed;with call bell/phone within reach;with family/visitor present;Other (comment) (with MD present.) Nurse Communication: Mobility status    Functional Assessment Tool Used: Clinical judgment Functional Limitation: Mobility: Walking and moving around Mobility: Walking and Moving Around Current Status (G8916): At least 20 percent but less than 40 percent impaired, limited or restricted Mobility: Walking and Moving Around Goal Status [phone]): At least 1 percent but less than 20 percent impaired, limited or restricted    Time: 1510-1533 PT Time Calculation (min) (ACUTE ONLY): 23 min   Charges:   PT Evaluation $Initial PT Evaluation Tier I: 1 Procedure PT Treatments $Gait Training: 8-22 mins   PT G Codes:   PT G-Codes **NOT FOR INPATIENT CLASS** Functional Assessment Tool Used: Clinical judgment Functional Limitation: Mobility: Walking and moving around Mobility: Walking and Moving Around Current Status (U8828): At least 20  percent but less than 40 percent impaired, limited or restricted Mobility: Walking and Moving Around Goal Status [phone]): At least 1 percent but less than 20 percent impaired, limited or restricted    [place] 08/20/2015, 3:45 PM Wray Kearns, [place], DPT [phone]

## 2015-08-20 NOTE — Progress Notes (Signed)
Paged and spoke with Dr. Lovena Le regarding rescheduling of bone marrow biopsy and that the lumbar puncture must be attempted on the unit first and if unsuccessful IR will do it. Nursing staff will continue to monitor. Earleen Reaper, RN

## 2015-08-21 ENCOUNTER — Ambulatory Visit (HOSPITAL_COMMUNITY): Payer: Medicare Other

## 2015-08-21 DIAGNOSIS — R630 Anorexia: Secondary | ICD-10-CM

## 2015-08-21 DIAGNOSIS — R109 Unspecified abdominal pain: Secondary | ICD-10-CM

## 2015-08-21 LAB — CBC WITH DIFFERENTIAL/PLATELET
BASOS PCT: 1 %
Basophils Absolute: 0.1 10*3/uL (ref 0.0–0.1)
EOS PCT: 1 %
Eosinophils Absolute: 0.1 10*3/uL (ref 0.0–0.7)
HEMATOCRIT: 35.3 % — AB (ref 36.0–46.0)
HEMOGLOBIN: 11.8 g/dL — AB (ref 12.0–15.0)
LYMPHS ABS: 2.8 10*3/uL (ref 0.7–4.0)
Lymphocytes Relative: 35 %
MCH: 29.3 pg (ref 26.0–34.0)
MCHC: 33.4 g/dL (ref 30.0–36.0)
MCV: 87.6 fL (ref 78.0–100.0)
MONOS PCT: 14 %
Monocytes Absolute: 1.1 10*3/uL — ABNORMAL HIGH (ref 0.1–1.0)
NEUTROS ABS: 3.8 10*3/uL (ref 1.7–7.7)
Neutrophils Relative %: 49 %
Platelets: 35 10*3/uL — ABNORMAL LOW (ref 150–400)
RBC: 4.03 MIL/uL (ref 3.87–5.11)
RDW: 16.7 % — ABNORMAL HIGH (ref 11.5–15.5)
WBC: 7.9 10*3/uL (ref 4.0–10.5)

## 2015-08-21 LAB — BASIC METABOLIC PANEL
Anion gap: 13 (ref 5–15)
BUN: 15 mg/dL (ref 6–20)
CHLORIDE: 98 mmol/L — AB (ref 101–111)
CO2: 26 mmol/L (ref 22–32)
CREATININE: 0.99 mg/dL (ref 0.44–1.00)
Calcium: 8.5 mg/dL — ABNORMAL LOW (ref 8.9–10.3)
GFR calc non Af Amer: 52 mL/min — ABNORMAL LOW (ref >60)
Glucose, Bld: 174 mg/dL — ABNORMAL HIGH (ref 65–99)
Potassium: 4 mmol/L (ref 3.5–5.1)
Sodium: 137 mmol/L (ref 135–145)

## 2015-08-21 LAB — CBC
HCT: 36.4 % (ref 36.0–46.0)
HEMOGLOBIN: 12.4 g/dL (ref 12.0–15.0)
MCH: 29.3 pg (ref 26.0–34.0)
MCHC: 34.1 g/dL (ref 30.0–36.0)
MCV: 86.1 fL (ref 78.0–100.0)
Platelets: 59 10*3/uL — ABNORMAL LOW (ref 150–400)
RBC: 4.23 MIL/uL (ref 3.87–5.11)
RDW: 16.6 % — ABNORMAL HIGH (ref 11.5–15.5)
WBC: 8.2 10*3/uL (ref 4.0–10.5)

## 2015-08-21 LAB — EPSTEIN BARR VRS(EBV DNA BY PCR)
EBV DNA QN BY PCR: NEGATIVE {copies}/mL
log10 EBV DNA Qn PCR: UNDETERMINED log10copy/mL

## 2015-08-21 LAB — BONE MARROW EXAM

## 2015-08-21 MED ORDER — MIDAZOLAM HCL 2 MG/2ML IJ SOLN
INTRAMUSCULAR | Status: AC | PRN
  Administered 2015-08-21 (×3): 1 mg via INTRAVENOUS

## 2015-08-21 MED ORDER — LIDOCAINE HCL 1 % IJ SOLN
INTRAMUSCULAR | Status: AC
  Filled 2015-08-21: qty 20

## 2015-08-21 MED ORDER — SODIUM CHLORIDE 0.9 % IV SOLN
Freq: Once | INTRAVENOUS | Status: AC
  Administered 2015-08-21: 06:00:00 via INTRAVENOUS

## 2015-08-21 MED ORDER — FENTANYL CITRATE (PF) 100 MCG/2ML IJ SOLN
INTRAMUSCULAR | Status: AC
  Filled 2015-08-21: qty 4

## 2015-08-21 MED ORDER — FENTANYL CITRATE (PF) 100 MCG/2ML IJ SOLN
INTRAMUSCULAR | Status: AC | PRN
  Administered 2015-08-21: 25 ug via INTRAVENOUS
  Administered 2015-08-21: 50 ug via INTRAVENOUS
  Administered 2015-08-21: 25 ug via INTRAVENOUS

## 2015-08-21 MED ORDER — SODIUM CHLORIDE 0.9 % IV SOLN: Freq: Once | INTRAVENOUS | Status: DC

## 2015-08-21 MED ORDER — MIDAZOLAM HCL 2 MG/2ML IJ SOLN
INTRAMUSCULAR | Status: AC
  Filled 2015-08-21: qty 6

## 2015-08-21 NOTE — Progress Notes (Addendum)
Platelet count 35k today. Dr. Barbie Banner and Dr. Irene Limbo have discussed this today. IR will proceed with bone marrow biopsy today however will need to wait for periaortic lymph node biopsy for tomorrow 10/13 until plt > 50k. Will transfuse 2 units of platelets tonight and check CBC in am.   Tsosie Billing PA-C Interventional Radiology  08/21/15  9:18 AM     Patient did receive platelet transfusion this morning-appreciate internal medicine's assistance with this. Repeat CBC now is 59k, will discuss with Dr. Barbie Banner and hopefully move forward with both bone marrow biopsy and periaortic lymph node today.   Tsosie Billing PA-C Interventional Radiology  08/21/15  10:54 AM

## 2015-08-21 NOTE — Sedation Documentation (Signed)
Out of room- scanning pt- RN instructed to step out

## 2015-08-21 NOTE — Clinical Documentation Improvement (Signed)
Internal Medicine Oncology  Can the diagnosis of CKD be further specified?   CKD Stage I - GFR greater than or equal to 90  CKD Stage II - GFR 60-89  CKD Stage III - GFR 30-59  CKD Stage IV - GFR 15-29  CKD Stage V - GFR < 15  ESRD (End Stage Renal Disease)  Other condition  Unable to clinically determine  Supporting Information: : (risk factors, signs and symptoms, diagnostics, treatment) -- Consult Note 10/10 states "chest to complete staging. (ordered without contrast given CKD and recent IV contrast dye load and high risk of TLS)" -- GRF: 10/9 38,  10/10 44,  10/12 52   Please exercise your independent, professional judgment when responding. A specific answer is not anticipated or expected.  Thank You, Ezekiel Ina RN [place] [phone]

## 2015-08-21 NOTE — Progress Notes (Signed)
Subjective: Kristen Baker.  Patient denies pain or nausea.  She denies HA a/w with the LP.  She is currently without complaint and understands she will be going for biopsy today.  Objective: Vital signs in last 24 hours: Filed Vitals:   08/21/15 0552 08/21/15 0626 08/21/15 0659 08/21/15 0710  BP: 121/50 152/55 146/56 146/53  Pulse: 65 67 64 63  Temp: 98 F (36.7 C) 97.9 F (36.6 C) 98 F (36.7 C) 98.2 F (36.8 C)  TempSrc: Oral Oral Oral Oral  Resp: $Remo'18 18 18 18  'fDQLS$ Height:      Weight:  179 lb 8 oz (81.421 kg)    SpO2: 95% 94% 94% 93%   Weight change:   Intake/Output Summary (Last 24 hours) at 08/21/15 1039 Last data filed at 08/21/15 0844  Gross per 24 hour  Intake    271 ml  Output   1800 ml  Net  -1529 ml   Physical Exam  Constitutional: She is oriented to person, place, and time and well-developed, well-nourished, and in no distress. No distress.  Elderly female, reading in bed.  NAD  HENT:  Head: Normocephalic and atraumatic.  Eyes: EOM are normal. No scleral icterus.  Neck: No JVD present. No tracheal deviation present.  Cardiovascular: Normal rate, regular rhythm, normal heart sounds and intact distal pulses.   Pulmonary/Chest: Effort normal and breath sounds normal. No respiratory distress. She has no wheezes.  Minimal crackles in right lung base.  Abdominal: Soft. She exhibits no distension. There is no tenderness. There is no rebound and no guarding.  Musculoskeletal: She exhibits no edema.  Neurological: She is alert and oriented to person, place, and time.  Skin: Skin is warm and dry. No rash noted. She is not diaphoretic.    Lab Results: Basic Metabolic Panel:  Recent Labs Lab 08/18/15 2128 08/19/15 0458 08/21/15 0607  NA  --  142 137  K  --  3.7 4.0  CL  --  105 98*  CO2  --  25 26  GLUCOSE  --  90 174*  BUN  --  10 15  CREATININE  --  1.14* 0.99  CALCIUM  --  9.0 8.5*  MG 1.7  --   --    Liver Function Tests:  Recent Labs Lab 08/18/15 1408  08/19/15 0458  AST 69* 61*  ALT 16 16  ALKPHOS 95 88  BILITOT 0.8 0.6  PROT 5.7* 5.4*  ALBUMIN 3.2* 2.9*    Recent Labs Lab 08/18/15 1408  LIPASE 23   No results for input(s): AMMONIA in the last 168 hours. CBC:  Recent Labs Lab 08/19/15 0458 08/20/15 1630 08/21/15 0607  WBC 7.9 6.7 7.9  NEUTROABS 4.1  --  3.8  HGB 11.2* 10.9* 11.8*  HCT 34.3* 33.8* 35.3*  MCV 88.2 88.0 87.6  PLT 61* 41* 35*   Cardiac Enzymes: No results for input(s): CKTOTAL, CKMB, CKMBINDEX, TROPONINI in the last 168 hours. BNP: No results for input(s): PROBNP in the last 168 hours. D-Dimer:  Recent Labs Lab 08/19/15 1905  DDIMER 1.94*   CBG: No results for input(s): GLUCAP in the last 168 hours. Hemoglobin A1C: No results for input(s): HGBA1C in the last 168 hours. Fasting Lipid Panel: No results for input(s): CHOL, HDL, LDLCALC, TRIG, CHOLHDL, LDLDIRECT in the last 168 hours. Thyroid Function Tests: No results for input(s): TSH, T4TOTAL, FREET4, T3FREE, THYROIDAB in the last 168 hours. Coagulation:  Recent Labs Lab 08/18/15 1824 08/19/15 0458  LABPROT 14.8 15.7*  INR 1.15 1.23   Anemia Panel:  Recent Labs Lab 08/19/15 1905 08/20/15 0727  VITAMINB12  --  782  RETICCTPCT 0.7  --    Urine Drug Screen: Drugs of Abuse  No results found for: LABOPIA, COCAINSCRNUR, LABBENZ, AMPHETMU, THCU, LABBARB  Alcohol Level: No results for input(s): ETH in the last 168 hours. Urinalysis:  Recent Labs Lab 08/18/15 1600  COLORURINE YELLOW  LABSPEC 1.015  PHURINE 6.0  GLUCOSEU NEGATIVE  HGBUR TRACE*  BILIRUBINUR NEGATIVE  KETONESUR 15*  PROTEINUR NEGATIVE  UROBILINOGEN 0.2  NITRITE NEGATIVE  LEUKOCYTESUR NEGATIVE   Misc. Labs:   Micro Results: Recent Results (from the past 240 hour(s))  CSF culture with Stat gram stain     Status: None (Preliminary result)   Collection Time: 08/20/15  4:55 PM  Result Value Ref Range Status   Specimen Description CSF  Final   Special  Requests NONE  Final   Gram Stain   Final    WBC PRESENT, PREDOMINANTLY MONONUCLEAR NO ORGANISMS SEEN CYTOSPIN    Culture PENDING  Incomplete   Report Status PENDING  Incomplete   Studies/Results: Ct Chest Wo Contrast  08/19/2015  CLINICAL DATA:  81 year old female with abnormal CT Abdomen and Pelvis, possible lymphoma. Initial encounter. EXAM: CT CHEST WITHOUT CONTRAST TECHNIQUE: Multidetector CT imaging of the chest was performed following the standard protocol without IV contrast. COMPARISON:  CT Abdomen and Pelvis 08/18/2015. Chest and abdominal radiographs 08/18/2015. FINDINGS: Noncontrast exam. Stable abnormal inferior posterior mediastinum and retrocrural soft tissue as described recently. No pericardial effusion. No pleural effusion. Mediastinal and axillary lymph nodes are normal. Thoracic inlet lymph nodes are normal. Calcified aortic and coronary artery atherosclerosis. Major airways are patent. There are occasional calcified granulomas. There is right upper lobe bronchiectasis with mild patchy peribronchial opacity along the major fissure (series 305, image 16). Mild tree-in-bud nodular opacity along the inferior lateral right upper lobe near the minor fissure (image 24). Minor dependent atelectasis. Degenerative changes in the thoracic spine. No acute or suspicious osseous lesion in the chest. IMPRESSION: 1. Abnormal posterior mediastinal/retrocrural soft tissue as seen on the recent CT Abdomen and Pelvis. Mediastinal, axillary, and thoracic inlet lymph nodes are normal. 2. Right upper lobe bronchiectasis and peripheral / peribronchial opacity compatible with distal airway infection or less likely scarring. Electronically Signed   By: Genevie Ann M.D.   On: 08/19/2015 23:39   [name] VV Contrast  08/19/2015  CLINICAL DATA:  Headache.  Possible lymphoma. EXAM: MRI HEAD WITHOUT AND WITH CONTRAST TECHNIQUE: Multiplanar, multiecho pulse sequences of the brain and surrounding structures were  obtained without and with intravenous contrast. CONTRAST:  82mL MULTIHANCE GADOBENATE DIMEGLUMINE 529 MG/ML IV SOLN COMPARISON:  None. FINDINGS: Ventricle size normal. Cerebral volume normal. Pituitary normal in size. Craniocervical junction normal. Negative for acute infarct. Minimal hyperintensity in the periventricular white matter may represent chronic ischemia. Negative for hemorrhage or fluid collection. Negative for edema in the brain Postcontrast imaging reveals leptomeningeal enhancement diffusely and bilaterally. There is mild smooth enhancement of the meninges without nodularity. No enhancing mass lesion in the brain. IMPRESSION: Diffuse leptomeningeal enhancement. Given the history, this is concerning for lymphomatous involvement. Lumbar puncture with cytology recommended. Negative for acute infarct or mass lesion. Electronically Signed   By: Franchot Gallo M.D.   On: 08/19/2015 20:47   Medications: I have reviewed the patient's current medications. Scheduled Meds: . sodium chloride   Intravenous Once  . allopurinol  100 mg Oral BID  . dexamethasone  4 mg Intravenous 4 times per day  . pantoprazole  40 mg Oral Daily  . Vitamin D (Ergocalciferol)  50,000 Units Oral Q7 days   Continuous Infusions:  PRN Meds:.iohexol, promethazine, traMADol Assessment/Plan: Principal Problem:   Retroperitoneal lymphadenopathy Active Problems:   Thrombocytopenia (HCC)   Weight loss   Generalized weakness   Vitamin B12 deficiency   GERD (gastroesophageal reflux disease)   Hx of gastric ulcer   Hx of cervical cancer   Hypokalemia   Headache   At high risk of tumor lysis syndrome   Numbness of face  Ms. Hoheisel is a pleasant 81 year old lady presenting with a one-month history of B-symptoms, lower band-like abdominal pain, headaches, and chin numbness, found to have severe thrombocytopenia and retroperitoneal lymphadenopathy concerning for lymphoma and possibly a spinal metastasis.   Retroperitoneal  lymphadenopathy: Patient with undiagnosed etiology of persistent anemia and now thrombocytopenia has had B symptoms and imaging suggestive of lymphoma. Notes from [facility] in July describe treatment for persistent anemia with normal platelet count.  Therefore, her current thrombocytopenia appears to be a more acute development.   Given her prominent lymphadenopathy without splenomegaly, there is now concern for bone marrow involvement.  Patient also complaining of HA and bilateral numb chin.  Numb chin syndrome is often 2/2 CNS involvement of lymphoma, and MRI concerning for leptomeningeal involvement.  Heme-Onc has been consulted with ddx including DLBCL, Burkitts, Mantle cell, and ALL.  - Heme/Onc consult, she should receive her first dose of chemo while inpatient and PET/CT for complete staging.  - CT chest: lymphadenopathy with possible distal airway infection (no pulmonary symptoms at this time; will CTM for fever or pulmonary complaint) - MRI brain: diffuse leptomeningeal enhancement - Lumbar puncture with low cell count, normal protein, normal glucose.  No organisms on Gram stain.  WBC present. $RemoveBefo'[ ]'MNTFIsKjTQO$  LN biopsy $RemoveB'[ ]'DAaEWCxP$  Bone marrow biopsy $RemoveBefor'[ ]'YmREFNGdFTbp$  Peripheral blood flow cytometry - Dexamethasone 4 mg IV q6hours  Thrombocytopenia, stable: Likely 2/2 etiology underlying retroperitoneal LAD.  She is s/p 1 U plts with appropriate response.  Will continue to monitor pending Heme/Onc evaluation. -Peripheral smear with left shift and polychromasia, but no schistocytes -EBV -Hepatitis labs negative -HIV negative -HTLV  Abdominal pain and nausea, improved: Per above, likely related to her lymphadenopathy. Urinalysis was clean and she has been having normal bowel movements.  -Tramadol as needed - Phenergan as needed  Headache, improved: Likely 2/2 etiology underlying her retroperitoneal LAD.  Considering contrast MRI and LP as above. -Tramadol as needed - Dexamethasone as above once procedures are  performed  GERD: omeprazole  Dispo: Disposition is deferred at this time, awaiting improvement of current medical problems.    The patient does have a current PCP (Dr. Cyndy Freeze, [place]) and does need an [facility] hospital follow-up appointment after discharge.  The patient does not have transportation limitations that hinder transportation to clinic appointments.  .Services Needed at time of discharge: Y = Yes, Blank = No PT:   OT:   RN:   Equipment:   Other:     LOS: 1 day   Iline Oven, MD 08/21/2015, 10:39 AM

## 2015-08-21 NOTE — Sedation Documentation (Signed)
RN back in room.

## 2015-08-21 NOTE — Procedures (Signed)
BM aspirate and core LN Bx para-aortic No comp/EBL

## 2015-08-21 NOTE — Sedation Documentation (Signed)
BM bx complete, bandaid intact to lower back, will do LN bx now

## 2015-08-21 NOTE — Progress Notes (Addendum)
Patient ID: Kristen Baker, female   DOB: 02-17-1933, 81 y.o.   MRN: [id number]  Date: 08/21/2015  Patient name: Kristen Baker  Medical record number: [id number]  Date of birth: 1933/05/11   This patient's plan of care was discussed with the house staff. Please see their note for complete details. I concur with their findings. [name] is feeling significantly better today. She is no longer having any nausea. Her headaches have resolved and she is feeling stronger. She still has a very poor appetite. Her clinical presentation with headache and chin numbness and leptomeningeal enhancement on MRI suggest CNS involvement of her lymphoma even though her lumbar puncture is fairly unremarkable. CSF cytology is pending. She had no complications of her bone marrow and lymph node biopsy today. She her family are very eager to know how long she will need to be in the hospital. I let them know that we still have several issues to resolve before she is ready for discharge including:  1. Type of central IV access 2. Any further diagnostic testing that needs to occur before discharge 3. If she will start chemotherapy prior to discharge and if this also needs to include intrathecal therapy  Of course, we will need to coordinate all of this with Dr. Irene Limbo.  Michel Bickers, MD 08/21/2015, 3:29 PM

## 2015-08-21 NOTE — Evaluation (Signed)
Occupational Therapy Evaluation Patient Details Name: Kristen Baker MRN: [id number] DOB: Nov 01, 1933 Today's Date: 08/21/2015    History of Present Illness Patient is a 81 y/o female presens with 1 month history of progressive nausea, vomiting, night sweats, weight loss, lower abdominal pain, headache, chin numbness, and easy brusing. Labs were notable for platelet count of 34; CT of her abdomen and pelvis showed retroperitoneal and lower posterior mediastinal lymphadenopathy suggestive of lymphoma, and a subtle increased sacral epidural soft tissue that cannot be excluded as a spinal metastasis. PMH of cervical ca. LP completed 10/11.    Clinical Impression   Pt admitted with above, requiring min guard for functional mobility and supervision with ADLs due to problem listed below.  Provided education on DME and recommendation of grab bars in shower for safety.  Pt's daughter reports recently getting a seat for pt's shower.  Pt bumped in to items on Rt with ambulation requiring cues to scan to Rt, educated on scanning environment prior to and during mobility to increase safety. Pt will benefit from acute OT to maximize independence with ADLs prior to d/c home.    Follow Up Recommendations  Home health OT;Supervision - Intermittent    Equipment Recommendations  None recommended by OT    Recommendations for Other Services       Precautions / Restrictions Precautions Precautions: Fall Restrictions Weight Bearing Restrictions: No      Mobility Bed Mobility Overal bed mobility: Needs Assistance Bed Mobility: Sit to Supine       Sit to supine: Modified independent (Device/Increase time);HOB elevated (use of bed rails)      Transfers Overall transfer level: Needs assistance Equipment used: None Transfers: Sit to/from Stand Sit to Stand: Min guard         General transfer comment: Min guard for safety.  Stood from chair and toilet         ADL Overall ADL's : Needs  assistance/impaired     Grooming: Supervision/safety;Standing               Lower Body Dressing: Copywriter, advertising: Min guard           Functional mobility during ADLs: Min guard;Cueing for safety       Vision Vision Assessment?: Yes Eye Alignment: Impaired (comment) (Rt "lazy eye" ) Ocular Range of Motion: Restricted on the right (difficulty scanning Lt of midline) Tracking/Visual Pursuits: Right eye does not track medially;Decreased smoothness of horizontal tracking;Requires cues, head turns, or add eye shifts to track;Unable to hold eye position out of midline Saccades: Undershoots;Decreased speed of saccadic movement   Perception Perception Comments: WNL   Praxis Praxis Praxis tested?: Within functional limits    Pertinent Vitals/Pain Pain Assessment: No/denies pain     Hand Dominance Right   Extremity/Trunk Assessment Upper Extremity Assessment Upper Extremity Assessment: Generalized weakness   Lower Extremity Assessment Lower Extremity Assessment: Generalized weakness       Communication Communication Communication: No difficulties   Cognition Arousal/Alertness: Awake/alert Behavior During Therapy: WFL for tasks assessed/performed Overall Cognitive Status: Within Functional Limits for tasks assessed                                Home Living Family/patient expects to be discharged to:: Private residence Living Arrangements: Alone Available Help at Discharge: Friend(s);Available PRN/intermittently Type of Home: House Home Access: Stairs to enter [employer] of Steps: 4 Entrance Stairs-Rails: Right Home Layout: One  level     Bathroom Shower/Tub: Tub/shower unit;Walk-in shower (has both)         Home Equipment: None   Additional Comments: daughter reports she just got pt a shower seat       Prior Functioning/Environment Level of Independence: Independent        Comments: very active,  drives, cooks, cleans.    OT Diagnosis: Generalized weakness   OT Problem List: Decreased strength;Decreased activity tolerance;Impaired balance (sitting and/or standing);Impaired vision/perception;Decreased safety awareness;Decreased knowledge of use of DME or AE   OT Treatment/Interventions: Self-care/ADL training;Energy conservation;DME and/or AE instruction;Therapeutic activities;Patient/family education;Balance training    OT Goals(Current goals can be found in the care plan section) Acute Rehab OT Goals Patient Stated Goal: to feel better OT Goal Formulation: With patient Time For Goal Achievement: 09/04/15 Potential to Achieve Goals: Good  OT Frequency: Min 2X/week    End of Session Equipment Utilized During Treatment: Gait belt Nurse Communication: Mobility status  Activity Tolerance: Patient tolerated treatment well;No increased pain Patient left: in bed;with family/visitor present   Time: 4158-3094 OT Time Calculation (min): 16 min Charges:  OT General Charges $OT Visit: 1 Procedure OT Evaluation $Initial OT Evaluation Tier I: 1 Procedure G-CodesSimonne Come, [id number] 08/21/2015, 2:53 PM

## 2015-08-21 NOTE — Progress Notes (Addendum)
RN clarified with Tsosie Billing PA in IR that platelets are to be given starting at 8PM to provide boost in platelet count for lymph node biopsy in IR tomorrow if the procedure cannot be done today.

## 2015-08-22 DIAGNOSIS — R208 Other disturbances of skin sensation: Secondary | ICD-10-CM

## 2015-08-22 LAB — MULTIPLE MYELOMA PANEL, SERUM
ALBUMIN/GLOB SERPL: 1.4 (ref 0.7–1.7)
ALPHA2 GLOB SERPL ELPH-MCNC: 0.7 g/dL (ref 0.4–1.0)
Albumin SerPl Elph-Mcnc: 2.9 g/dL (ref 2.9–4.4)
Alpha 1: 0.3 g/dL (ref 0.0–0.4)
B-GLOBULIN SERPL ELPH-MCNC: 0.8 g/dL (ref 0.7–1.3)
GAMMA GLOB SERPL ELPH-MCNC: 0.2 g/dL — AB (ref 0.4–1.8)
GLOBULIN, TOTAL: 2.1 g/dL — AB (ref 2.2–3.9)
IGG (IMMUNOGLOBIN G), SERUM: 305 mg/dL — AB (ref 700–1600)
IgA: 81 mg/dL (ref 64–422)
IgM, Serum: 27 mg/dL (ref 26–217)
Total Protein ELP: 5 g/dL — ABNORMAL LOW (ref 6.0–8.5)

## 2015-08-22 LAB — PHOSPHORUS: PHOSPHORUS: 4 mg/dL (ref 2.5–4.6)

## 2015-08-22 LAB — BASIC METABOLIC PANEL
Anion gap: 14 (ref 5–15)
BUN: 25 mg/dL — ABNORMAL HIGH (ref 6–20)
CALCIUM: 8.9 mg/dL (ref 8.9–10.3)
CO2: 27 mmol/L (ref 22–32)
CREATININE: 0.87 mg/dL (ref 0.44–1.00)
Chloride: 99 mmol/L — ABNORMAL LOW (ref 101–111)
GFR calc non Af Amer: >60 mL/min (ref >60)
Glucose, Bld: 168 mg/dL — ABNORMAL HIGH (ref 65–99)
Potassium: 4.1 mmol/L (ref 3.5–5.1)
SODIUM: 140 mmol/L (ref 135–145)

## 2015-08-22 LAB — URIC ACID: Uric Acid, Serum: 4.3 mg/dL (ref 2.3–6.6)

## 2015-08-22 LAB — LACTATE DEHYDROGENASE: LDH: 1910 U/L — AB (ref 98–192)

## 2015-08-22 LAB — CBC
HEMATOCRIT: 32.2 % — AB (ref 36.0–46.0)
Hemoglobin: 10.6 g/dL — ABNORMAL LOW (ref 12.0–15.0)
MCH: 28.4 pg (ref 26.0–34.0)
MCHC: 32.9 g/dL (ref 30.0–36.0)
MCV: 86.3 fL (ref 78.0–100.0)
PLATELETS: 47 10*3/uL — AB (ref 150–400)
RBC: 3.73 MIL/uL — ABNORMAL LOW (ref 3.87–5.11)
RDW: 16.5 % — AB (ref 11.5–15.5)
WBC: 6.2 10*3/uL (ref 4.0–10.5)

## 2015-08-22 LAB — PREPARE PLATELET PHERESIS: UNIT DIVISION: 0

## 2015-08-22 MED ORDER — POLYETHYLENE GLYCOL 3350 17 G PO PACK
17.0000 g | PACK | Freq: Every day | ORAL | Status: DC
  Administered 2015-08-22 – 2015-08-29 (×4): 17 g via ORAL
  Filled 2015-08-22 (×9): qty 1

## 2015-08-22 NOTE — Progress Notes (Signed)
Occupational Therapy Treatment Patient Details Name: Melyssa Signor MRN: [id number] DOB: 1933-02-14 Today's Date: 08/22/2015    History of present illness Patient is a 81 y/o female presens with 1 month history of progressive nausea, vomiting, night sweats, weight loss, lower abdominal pain, headache, chin numbness, and easy brusing. Labs were notable for platelet count of 34; CT of her abdomen and pelvis showed retroperitoneal and lower posterior mediastinal lymphadenopathy suggestive of lymphoma, and a subtle increased sacral epidural soft tissue that cannot be excluded as a spinal metastasis. PMH of cervical ca. Awaiting peripheral blood flow cytometry, Bone Marrow and LN biopsy results 10/13.   OT comments  Patient with improved activity tolerance and progress towards OT goals. OT will continue to follow pt while in hospital per plan of care.   Follow Up Recommendations       Equipment Recommendations  None recommended by OT    Recommendations for Other Services      Precautions / Restrictions Precautions Precautions: Fall Restrictions Weight Bearing Restrictions: No       Mobility Bed Mobility               General bed mobility comments: sitting in recliner upon arrival  Transfers Overall transfer level: Needs assistance Equipment used: None Transfers: Sit to/from Stand Sit to Stand: Supervision             Balance                    ADL Overall ADL's : Needs assistance/impaired Eating/Feeding: Independent;Sitting   Grooming: Supervision/safety;Standing   Upper Body Bathing: Set up;Supervision/ safety;Sitting Upper Body Bathing Details (indicate cue type and reason): except for washing back per patient Lower Body Bathing: Set up;Supervison/ safety;Sit to/from stand   Upper Body Dressing : Set up;Sitting;Standing   Lower Body Dressing: Set up;Supervision/safety;Sit to/from stand   Toilet Transfer: Supervision/safety;Regular  Toilet;RW;Ambulation   Toileting- Water quality scientist and Hygiene: Supervision/safety;Sit to/from stand       Functional mobility during ADLs: Supervision/safety;Rolling walker General ADL Comments: Patient up in recliner on arrival. Reports she performed own sponge bath independent except needed A with washing her back. She reports she donned new gown, mesh underwear, and socks without difficulty. Patient demonstrated ability to reach all body parts sitting and standing during session. Patient states she already brushed her teeth but wanted to go for a walk. Sit to stand from chair supervision, ambulated with RW in room and hall with supervision. Prior to ambulation, donned gown as robe with setup in standing. Removed robe gown in standing after ambulation. Returned to [employer]. Demonstrated good safety and activity tolerance during the session.      Vision                     Perception     Praxis      Cognition   Behavior During Therapy: WFL for tasks assessed/performed Overall Cognitive Status: Within Functional Limits for tasks assessed                       Extremity/Trunk Assessment               Exercises     Shoulder Instructions       General Comments      Pertinent Vitals/ Pain       Pain Assessment: No/denies pain  Home Living  Prior Functioning/Environment              Frequency Min 2X/week     Progress Toward Goals  OT Goals(current goals can now be found in the care plan section)  Progress towards OT goals: Progressing toward goals     Plan Discharge plan remains appropriate    Co-evaluation                 End of Session Equipment Utilized During Treatment: Rolling walker   Activity Tolerance Patient tolerated treatment well   Patient Left in chair;with call bell/phone within reach   Nurse Communication          Time: 7939-0300 OT Time  Calculation (min): 13 min  Charges: OT General Charges $OT Visit: 1 Procedure OT Treatments $Therapeutic Activity: 8-22 mins  Egor Fullilove A 08/22/2015, 11:24 AM

## 2015-08-22 NOTE — Progress Notes (Signed)
Physical Therapy Treatment Patient Details Name: Kristen Baker MRN: [id number] DOB: September 05, 1933 Today's Date: 08/22/2015    History of Present Illness Patient is a 81 y/o female presens with 1 month history of progressive nausea, vomiting, night sweats, weight loss, lower abdominal pain, headache, chin numbness, and easy brusing. Labs were notable for platelet count of 34; CT of her abdomen and pelvis showed retroperitoneal and lower posterior mediastinal lymphadenopathy suggestive of lymphoma, and a subtle increased sacral epidural soft tissue that cannot be excluded as a spinal metastasis. PMH of cervical ca. Awaiting peripheral blood flow cytometry, Bone Marrow and LN biopsy results 10/13.    PT Comments    Patient progressing well towards PT goals. Balance improved with use of RW today. Continues to report dyspnea on exertion but HR stable ranging from 80-95 bpm during exercise. Encouraged daily ambulation with RN. Will plan for stair training next session as tolerated. Will follow acutely.   Follow Up Recommendations  Home health PT;Supervision - Intermittent     Equipment Recommendations  Other (comment) (TBD)    Recommendations for Other Services       Precautions / Restrictions Precautions Precautions: Fall Restrictions Weight Bearing Restrictions: No    Mobility  Bed Mobility               General bed mobility comments: Sitting in chair upon PT arrival.   Transfers Overall transfer level: Needs assistance Equipment used: None Transfers: Sit to/from Stand Sit to Stand: Supervision         General transfer comment: Supervision for safety. Stood from [employer], from toilet x1.   Ambulation/Gait Ambulation/Gait assistance: Min guard Ambulation Distance (Feet): 200 Feet Assistive device: Rolling walker (2 wheeled) Gait Pattern/deviations: Step-through pattern;Decreased stride length;Drifts right/left   Gait velocity interpretation: at or above normal speed  for age/gender General Gait Details: Cues for decreased speed. Balance improved with use of RW. Drifting noted. No bumping into objects/obstacles.   Stairs            Wheelchair Mobility    Modified Rankin (Stroke Patients Only)       Balance Overall balance assessment: Needs assistance Sitting-balance support: No upper extremity supported;Feet supported Sitting balance-Leahy Scale: Good     Standing balance support: During functional activity Standing balance-Leahy Scale: Fair Standing balance comment: Tolerated hand washing at sink without UE support and peri care independently.                    Cognition Arousal/Alertness: Awake/alert Behavior During Therapy: WFL for tasks assessed/performed Overall Cognitive Status: Within Functional Limits for tasks assessed                      Exercises Other Exercises Other Exercises: Sit to stand x10     General Comments        Pertinent Vitals/Pain Pain Assessment: No/denies pain    Home Living                      Prior Function            PT Goals (current goals can now be found in the care plan section) Progress towards PT goals: Progressing toward goals    Frequency  Min 3X/week    PT Plan Current plan remains appropriate    Co-evaluation             End of Session Equipment Utilized During Treatment: Gait belt Activity Tolerance: Patient tolerated treatment  well Patient left: in chair;with call bell/phone within reach     Time: 0905-0921 PT Time Calculation (min) (ACUTE ONLY): 16 min  Charges:  $Therapeutic Exercise: 8-22 mins                    G Codes:      Rainbow Salman A Corydon Schweiss 08/22/2015, 9:30 AM  Wray Kearns, PT, DPT [phone]

## 2015-08-22 NOTE — Progress Notes (Signed)
Subjective: [name].  Patient s/p CT guided LN and BM biopsy yesterday.  She denies pain at the biopsy sites.  She denies nausea or HA.  She is most interested in hearing how long she will be in the hospital and what other tests need to be in the hospital.    Objective: Vital signs in last 24 hours: Filed Vitals:   08/21/15 1430 08/21/15 2230 08/22/15 0154 08/22/15 0551  BP: 132/55 85/63 116/36 143/63  Pulse: 68 72 61 65  Temp: 97.7 F (36.5 C) 98 F (36.7 C)  97.6 F (36.4 C)  TempSrc: Oral Oral  Oral  Resp: _0 Height:      Weight:    175 lb 11.2 oz (79.697 kg)  SpO2: 94% 95%  94%   Weight change: -3 lb 12.8 oz (-1.724 kg)  Intake/Output Summary (Last 24 hours) at 08/22/15 0913 Last data filed at 08/22/15 0554  Gross per 24 hour  Intake    220 ml  Output   1400 ml  Net  -1180 ml   Physical Exam  Constitutional: She is oriented to person, place, and time and well-developed, well-nourished, and in no distress. No distress.  Elderly female, reading in chair.  NAD  HENT:  Head: Normocephalic and atraumatic.  Eyes: EOM are normal. No scleral icterus.  Neck: No JVD present. No tracheal deviation present.  Cardiovascular: Normal rate, regular rhythm, normal heart sounds and intact distal pulses.   Pulmonary/Chest: Effort normal. No respiratory distress. She has no wheezes.  Minimal crackles in left lung base.  Abdominal: Soft. She exhibits no distension. There is no tenderness. There is no rebound and no guarding.  Musculoskeletal: She exhibits no edema.  Neurological: She is alert and oriented to person, place, and time.  Skin: Skin is warm and dry. No rash noted. She is not diaphoretic.  Biopsy sites on lumbar and presacral areas covered with bandaid without drainage or signs of infection.    Lab Results: Basic Metabolic Panel:  Recent Labs Lab 08/18/15 2128  08/21/15 0607 08/22/15 0815  NA  --   < > 137 140  K  --   < > 4.0 4.1  CL  --   < > 98* 99*  CO2   --   < > 26 27  GLUCOSE  --   < > 174* 168*  BUN  --   < > 15 25*  CREATININE  --   < > 0.99 0.87  CALCIUM  --   < > 8.5* 8.9  MG 1.7  --   --   --   < > = values in this interval not displayed. Liver Function Tests:  Recent Labs Lab 08/18/15 1408 08/19/15 0458  AST 69* 61*  ALT 16 16  ALKPHOS 95 88  BILITOT 0.8 0.6  PROT 5.7* 5.4*  ALBUMIN 3.2* 2.9*    Recent Labs Lab 08/18/15 1408  LIPASE 23   No results for input(s): AMMONIA in the last 168 hours. CBC:  Recent Labs Lab 08/19/15 0458  08/21/15 0607 08/21/15 1032 08/22/15 0815  WBC 7.9  < > 7.9 8.2 6.2  NEUTROABS 4.1  --  3.8  --   --   HGB 11.2*  < > 11.8* 12.4 10.6*  HCT 34.3*  < > 35.3* 36.4 32.2*  MCV 88.2  < > 87.6 86.1 86.3  PLT 61*  < > 35* 59* 47*  < > = values in this interval not displayed.  Cardiac Enzymes: No results for input(s): CKTOTAL, CKMB, CKMBINDEX, TROPONINI in the last 168 hours. BNP: No results for input(s): PROBNP in the last 168 hours. D-Dimer:  Recent Labs Lab 08/19/15 1905  DDIMER 1.94*   CBG: No results for input(s): GLUCAP in the last 168 hours. Hemoglobin A1C: No results for input(s): HGBA1C in the last 168 hours. Fasting Lipid Panel: No results for input(s): CHOL, HDL, LDLCALC, TRIG, CHOLHDL, LDLDIRECT in the last 168 hours. Thyroid Function Tests: No results for input(s): TSH, T4TOTAL, FREET4, T3FREE, THYROIDAB in the last 168 hours. Coagulation:  Recent Labs Lab 08/18/15 1824 08/19/15 0458  LABPROT 14.8 15.7*  INR 1.15 1.23   Anemia Panel:  Recent Labs Lab 08/19/15 1905 08/20/15 0727  VITAMINB12  --  782  RETICCTPCT 0.7  --    Urine Drug Screen: Drugs of Abuse  No results found for: LABOPIA, COCAINSCRNUR, LABBENZ, AMPHETMU, THCU, LABBARB  Alcohol Level: No results for input(s): ETH in the last 168 hours. Urinalysis:  Recent Labs Lab 08/18/15 1600  COLORURINE YELLOW  LABSPEC 1.015  PHURINE 6.0  GLUCOSEU NEGATIVE  HGBUR TRACE*  BILIRUBINUR  NEGATIVE  KETONESUR 15*  PROTEINUR NEGATIVE  UROBILINOGEN 0.2  NITRITE NEGATIVE  LEUKOCYTESUR NEGATIVE   Misc. Labs:   Micro Results: Recent Results (from the past 240 hour(s))  CSF culture with Stat gram stain     Status: None (Preliminary result)   Collection Time: 08/20/15  4:55 PM  Result Value Ref Range Status   Specimen Description CSF  Final   Special Requests NONE  Final   Gram Stain   Final    WBC PRESENT, PREDOMINANTLY MONONUCLEAR NO ORGANISMS SEEN CYTOSPIN    Culture NO GROWTH < 24 HOURS  Final   Report Status PENDING  Incomplete   Studies/Results: Ct Biopsy  08/21/2015  CLINICAL DATA:  Suspected lymphoma EXAM: CT-GUIDED BIOPSY BONE MARROW AND PARA-AORTIC LYMPH NODE. MEDICATIONS AND MEDICAL HISTORY: Versed 2 mg, Fentanyl 100 mcg. Additional Medications: None. ANESTHESIA/SEDATION: Moderate sedation time: Third minutes PROCEDURE: The procedure, risks, benefits, and alternatives were explained to the patient. Questions regarding the procedure were encouraged and answered. The patient understands and consents to the procedure. The back was prepped with Betadine in a sterile fashion, and a sterile drape was applied covering the operative field. A sterile gown and sterile gloves were used for the procedure. Under CT guidance, an 11 gauge needle was inserted into the right iliac bone via posterior approach. Aspirates and a core were obtained Under CT guidance, an 17 gauge needle was inserted into the para caval lymph node via right posterior lateral approach. Four 18 gauge core biopsies were obtained. The guide needle was removed. Patient tolerated the procedure well without complication. Vital sign monitoring by nursing staff during the procedure will continue as patient is in the special procedures unit for post procedure observation. FINDINGS: The images document guide needle placement within the right iliac bone and pericaval lymph node. Post biopsy images demonstrate no  hemorrhage. COMPLICATIONS: None IMPRESSION: Successful CT-guided bone marrow aspirate, bone marrow core, and pericaval lymph node core which was placed in saline for lymphoma flow analysis. Electronically Signed   By: Marybelle Killings M.D.   On: 08/21/2015 14:10   Ct Biopsy  08/21/2015  CLINICAL DATA:  Suspected lymphoma EXAM: CT-GUIDED BIOPSY BONE MARROW AND PARA-AORTIC LYMPH NODE. MEDICATIONS AND MEDICAL HISTORY: Versed 2 mg, Fentanyl 100 mcg. Additional Medications: None. ANESTHESIA/SEDATION: Moderate sedation time: Third minutes PROCEDURE: The procedure, risks, benefits, and alternatives were explained  to the patient. Questions regarding the procedure were encouraged and answered. The patient understands and consents to the procedure. The back was prepped with Betadine in a sterile fashion, and a sterile drape was applied covering the operative field. A sterile gown and sterile gloves were used for the procedure. Under CT guidance, an 11 gauge needle was inserted into the right iliac bone via posterior approach. Aspirates and a core were obtained Under CT guidance, an 17 gauge needle was inserted into the para caval lymph node via right posterior lateral approach. Four 18 gauge core biopsies were obtained. The guide needle was removed. Patient tolerated the procedure well without complication. Vital sign monitoring by nursing staff during the procedure will continue as patient is in the special procedures unit for post procedure observation. FINDINGS: The images document guide needle placement within the right iliac bone and pericaval lymph node. Post biopsy images demonstrate no hemorrhage. COMPLICATIONS: None IMPRESSION: Successful CT-guided bone marrow aspirate, bone marrow core, and pericaval lymph node core which was placed in saline for lymphoma flow analysis. Electronically Signed   By: Marybelle Killings M.D.   On: 08/21/2015 14:10   Medications: I have reviewed the patient's current medications. Scheduled  Meds: . sodium chloride   Intravenous Once  . allopurinol  100 mg Oral BID  . dexamethasone  4 mg Intravenous 4 times per day  . pantoprazole  40 mg Oral Daily  . Vitamin D (Ergocalciferol)  50,000 Units Oral Q7 days   Continuous Infusions:  PRN Meds:.iohexol, promethazine, traMADol Assessment/Plan: Principal Problem:   Retroperitoneal lymphadenopathy Active Problems:   Thrombocytopenia (HCC)   Weight loss   Generalized weakness   Vitamin B12 deficiency   GERD (gastroesophageal reflux disease)   Hx of gastric ulcer   Hx of cervical cancer   Hypokalemia   Headache   At high risk of tumor lysis syndrome   Numbness of face  Kristen Baker is a pleasant 81 year old lady presenting with a one-month history of B-symptoms, lower band-like abdominal pain, headaches, and chin numbness, found to have severe thrombocytopenia and retroperitoneal lymphadenopathy concerning for lymphoma and possibly a spinal metastasis.   Retroperitoneal lymphadenopathy: Patient with undiagnosed etiology of persistent anemia and now thrombocytopenia has had B symptoms and imaging suggestive of lymphoma. Notes from [facility] in July describe treatment for persistent anemia with normal platelet count.  Therefore, her current thrombocytopenia appears to be a more acute development.   Given her prominent lymphadenopathy without splenomegaly, there is now concern for bone marrow involvement.  Patient also complaining of HA and bilateral numb chin.  Numb chin syndrome is often 2/2 CNS involvement of lymphoma, and MRI concerning for leptomeningeal involvement.  Per Onc, LP can have false negative in 30% of patients. Heme-Onc has been consulted with ddx including DLBCL, Burkitts, Mantle cell, and ALL.  She does not appear to need further diagnostic testing while she is here.  - Heme/Onc consult, she should receive her first dose of chemo while inpatient and PET/CT for complete staging.  Port placement recommended, though  unclear whether patient has agreed to treatment.  Will follow next Heme/Onc note for shared-decision making conversation before ordering Port. - CT chest: lymphadenopathy with possible distal airway infection (no pulmonary symptoms at this time; will CTM for fever or pulmonary complaint) - MRI brain: diffuse leptomeningeal enhancement - Lumbar puncture with low cell count, normal protein, normal glucose.  No organisms on Gram stain.  WBC present. _0  LN biopsy results _1   Bone marrow biopsy results _0  Peripheral blood flow cytometry _1  Port placement for future chemotherapy - Dexamethasone 4 mg IV q6hours  Thrombocytopenia, stable: Likely 2/2 etiology underlying retroperitoneal LAD.  She is s/p 1 U plts with appropriate response.  Will continue to monitor pending Heme/Onc evaluation. -Peripheral smear with left shift and polychromasia, but no schistocytes -EBV negative -Hepatitis labs negative -HIV negative -HTLV: negative  Abdominal pain and nausea, improved: Per above, likely related to her lymphadenopathy. Urinalysis was clean and she has been having normal bowel movements.  -Tramadol as needed - Phenergan as needed  Headache, improved: Likely 2/2 etiology underlying her retroperitoneal LAD.  Considering contrast MRI and LP as above. -Tramadol as needed - Dexamethasone  GERD: omeprazole  Dispo: Disposition is deferred at this time, awaiting improvement of current medical problems.    The patient does have a current PCP (Dr. Cyndy Freeze, [place]) and does need an [facility] hospital follow-up appointment after discharge.  The patient does not have transportation limitations that hinder transportation to clinic appointments.  .Services Needed at time of discharge: Y = Yes, Blank = No PT:  Home Health PT  OT:   RN:   Equipment:   Other:     LOS: 2 days   Kristen Oven, MD 08/22/2015, 9:13 AM

## 2015-08-22 NOTE — Progress Notes (Signed)
Oncology short Note  Awaiting peripheral blood flow cytometry, Bone Marrow and LN biopsy results. LP bland with neg cytology which can happen in about 30% of patient with secondary lymphomatous leptomeningeal involvement, often due to CSF flow obstruction causing inability to detect lymphoma cells in spinal tap. Diagnostic yield would be higher with leptomeningeal Bx but increased morbidity so will need to weight the options in the light of the overall diagnosis and treatment plan. Will followup with patient with afternoon. Hope to get a prelim from pathology to define treatment plan.  Sullivan Lone MD

## 2015-08-23 ENCOUNTER — Ambulatory Visit (HOSPITAL_COMMUNITY): Payer: Medicare Other

## 2015-08-23 DIAGNOSIS — R599 Enlarged lymph nodes, unspecified: Secondary | ICD-10-CM

## 2015-08-23 DIAGNOSIS — C9590 Leukemia, unspecified not having achieved remission: Secondary | ICD-10-CM

## 2015-08-23 DIAGNOSIS — D696 Thrombocytopenia, unspecified: Secondary | ICD-10-CM

## 2015-08-23 LAB — CSF CULTURE W GRAM STAIN: Culture: NO GROWTH

## 2015-08-23 LAB — CSF CULTURE

## 2015-08-23 MED ORDER — SENNOSIDES-DOCUSATE SODIUM 8.6-50 MG PO TABS
2.0000 | ORAL_TABLET | Freq: Every day | ORAL | Status: DC
  Administered 2015-08-23 – 2015-08-30 (×5): 2 via ORAL
  Filled 2015-08-23 (×8): qty 2

## 2015-08-23 MED ORDER — MAGNESIUM CITRATE PO SOLN
1.0000 | Freq: Once | ORAL | Status: AC
  Administered 2015-08-23: 1 via ORAL
  Filled 2015-08-23: qty 296

## 2015-08-23 NOTE — Progress Notes (Signed)
Nutrition Follow-up  DOCUMENTATION CODES:   Not applicable  INTERVENTION:   -Continue with regular diet  NUTRITION DIAGNOSIS:   Inadequate oral intake related to poor appetite as evidenced by meal completion < 50%.  Progressing  GOAL:   Patient will meet greater than or equal to 90% of their needs  Progressing  MONITOR:   Diet advancement, Weight trends, Labs, I & O's  REASON FOR ASSESSMENT:   Malnutrition Screening Tool    ASSESSMENT:   81 year old lady a history of cervical cancer in the 1970s status-post hysterectomy and gastroesophageal reflux disease status-post Nissen fundoplication who presents with a one month history of progressive nausea, vomiting, night sweats, weight loss, lower abdominal pain, headache, chin numbness, and easy brusing. CT of her abdomen and pelvis showed retroperitoneal and lower posterior mediastinal lymphadenopathy suggestive of lymphoma, and a subtle increased sacral epidural soft tissue that cannot be excluded as a spinal metastasis.  Reviewed oncology note; BM and LN picture consistent with either B-ALL or Burkitts leukemia/lymphoma. Pt and daughter are still deciding goals of care and treatment options.  Pt has been advanced to a regular diet. Pt reports that her appetite is improving and she consumed 100% of her breakfast. Offered supplements, however, pt politely declined as she feels she can meet her nutritional needs via meals. Discussed importance of continued good meal intake to promote healing.   Labs reviewed.  Diet Order:  Diet regular Room service appropriate?: Yes; Fluid consistency:: Thin  Skin:  Reviewed, no issues  Last BM:  08/17/15  Height:   Ht Readings from Last 1 Encounters:  08/18/15 5' 7.2" (1.707 m)    Weight:   Wt Readings from Last 1 Encounters:  08/23/15 174 lb 9.7 oz (79.2 kg)    Ideal Body Weight:  61.8 kg  BMI:  Body mass index is 27.18 kg/(m^2).  Estimated Nutritional Needs:   Kcal:   1224-4975  Protein:  85-100 grams  Fluid:  1.8 - 2 L/day  EDUCATION NEEDS:   No education needs identified at this time  Shaili Donalson A. Jimmye Norman, RD, LDN, CDE Pager: [phone] After hours Pager: [phone]

## 2015-08-23 NOTE — Progress Notes (Signed)
Patient ID: Kristen Baker, female   DOB: 02-13-1933, 81 y.o.   MRN: [id number]  Date: 08/23/2015  Patient name: Kristen Baker  Medical record number: [id number]  Date of birth: 11/26/1932   This patient's plan of care was discussed with the house staff. Please see their note for complete details. I concur with their findings. [name] is feeling much better since she was started on dexamethasone. She has no more headache. She is feeling much stronger and her appetite has improved. She had a preliminary discussion with Dr. Irene Limbo of possible diagnostic workups and treatment options for her leukemia/lymphoma last night. She states that she is still considering whether or not she wants to undergo chemotherapy. We will hold off on Port-A-Cath placement until she has made a decision.  Michel Bickers, MD 08/23/2015, 1:19 PM

## 2015-08-23 NOTE — Progress Notes (Signed)
[name] Kitchen   HEMATOLOGY/ONCOLOGY INPATIENT PROGRESS NOTE  Date of Service: 08/22/2015  Inpatient Attending: .Michel Bickers, MD  SUBJECTIVE  I met with Ms Hammond and her daughter at bedside this evening. I talked with Dr Monica Martinez (Hematopathology) - Bone Marrow nearly completely replaced by abnormal cells. BM and LN picture consistent with either B-ALL or Burkitts leukemia/lymphoma. He will be able to give a final pathology on 08/23/2015 after running additional flowcytometry and awaiting FISH for c-MYC. I spent 47mns discussing what these results, what these diagnoses meant, broadly discussing treatment considerations, concern for CNS involvement despite neg LP and initiated a goals of care discussion to get a sense of what kind of treatments the patient might be willing to consider. We discussed that for a definitive proof of leptomeningeal involvement we sometimes need multiple LP's or a leptomeningeal biopsy vs making a clinical diagnosis.  Patient notes no further headaches. No issues with Bm Bx or LN bx or LP. Other than some fatigue she has no other acute symptoms at this time.   OBJECTIVE:  Resting in bed  PHYSICAL EXAMINATION: . Filed Vitals:   08/22/15 0154 08/22/15 0551 08/22/15 1405 08/22/15 2158  BP: 116/36 143/63 127/45 138/45  Pulse: 61 65 67 67  Temp:  97.6 F (36.4 C) 98.2 F (36.8 C) 97.7 F (36.5 C)  TempSrc:  Oral Oral Oral  Resp:  _0 Height:      Weight:  175 lb 11.2 oz (79.697 kg)    SpO2:  94% 95% 95%   Filed Weights   08/19/15 0544 08/21/15 0626 08/22/15 0551  Weight: 179 lb 8 oz (81.421 kg) 179 lb 8 oz (81.421 kg) 175 lb 11.2 oz (79.697 kg)   .Body mass index is 27.35 kg/(m^2).  GENERAL:alert, in no acute distress and comfortable SKIN: skin color, texture, turgor are normal, no rashes or significant lesions EYES: normal, conjunctiva are pink and non-injected, sclera clear OROPHARYNX:no exudate, no erythema and lips, buccal mucosa, and tongue normal   NECK: supple, no JVD, thyroid normal size, non-tender, without nodularity LYMPH: no palpable lymphadenopathy in the cervical, axillary or inguinal LUNGS: clear to auscultation with normal respiratory effort HEART: regular rate & rhythm, no murmurs and no lower extremity edema ABDOMEN: abdomen soft, non-tender, normoactive bowel sounds  Musculoskeletal: no cyanosis of digits and no clubbing  PSYCH: alert & oriented x 3 with fluent speech NEURO: no focal motor/sensory deficits.   MEDICAL HISTORY:  Past Medical History  Diagnosis Date  . Cancer ([facility]     cervical cancer    SURGICAL HISTORY: Past Surgical History  Procedure Laterality Date  . Cholecystectomy    . Tonsillectomy    . Abdominal hysterectomy    . Abdominal surgery      2-3 rd of colon removed    SOCIAL HISTORY: Social History   Social History  . Marital Status: Married    Spouse Name: N/A  . Number of Children: N/A  . Years of Education: N/A   Occupational History  . Not on file.   Social History Main Topics  . Smoking status: Never Smoker   . Smokeless tobacco: Not on file  . Alcohol Use: No  . Drug Use: No  . Sexual Activity: Not on file   Other Topics Concern  . Not on file   Social History Narrative    FAMILY HISTORY: History reviewed. No pertinent family history.  ALLERGIES:  is allergic to cabbage; onion; shellfish allergy; and sulfa antibiotics.  MEDICATIONS:  Scheduled Meds: . sodium chloride   Intravenous Once  . allopurinol  100 mg Oral BID  . dexamethasone  4 mg Intravenous 4 times per day  . pantoprazole  40 mg Oral Daily  . polyethylene glycol  17 g Oral Daily  . Vitamin D (Ergocalciferol)  50,000 Units Oral Q7 days   Continuous Infusions:  PRN Meds:.iohexol, promethazine, traMADol  REVIEW OF SYSTEMS:    10 Point review of Systems was done is negative except as noted above.   LABORATORY DATA:  I have reviewed the data as listed  . CBC Latest Ref Rng 08/22/2015  08/21/2015 08/21/2015  WBC 4.0 - 10.5 K/uL 6.2 8.2 7.9  Hemoglobin 12.0 - 15.0 g/dL 10.6(L) 12.4 11.8(L)  Hematocrit 36.0 - 46.0 % 32.2(L) 36.4 35.3(L)  Platelets 150 - 400 K/uL 47(L) 59(L) 35(L)    . CMP Latest Ref Rng 08/22/2015 08/21/2015 08/19/2015  Glucose 65 - 99 mg/dL 168(H) 174(H) 90  BUN 6 - 20 mg/dL 25(H) 15 10  Creatinine 0.44 - 1.00 mg/dL 0.87 0.99 1.14(H)  Sodium 135 - 145 mmol/L 140 137 142  Potassium 3.5 - 5.1 mmol/L 4.1 4.0 3.7  Chloride 101 - 111 mmol/L 99(L) 98(L) 105  CO2 22 - 32 mmol/L _0 Calcium 8.9 - 10.3 mg/dL 8.9 8.5(L) 9.0  Total Protein 6.5 - 8.1 g/dL - - 5.4(L)  Total Bilirubin 0.3 - 1.2 mg/dL - - 0.6  Alkaline Phos 38 - 126 U/L - - 88  AST 15 - 41 U/L - - 61(H)  ALT 14 - 54 U/L - - 16     RADIOGRAPHIC STUDIES: I have personally reviewed the radiological images as listed and agreed with the findings in the report. Ct Chest Wo Contrast  08/19/2015  CLINICAL DATA:  81 year old female with abnormal CT Abdomen and Pelvis, possible lymphoma. Initial encounter. EXAM: CT CHEST WITHOUT CONTRAST TECHNIQUE: Multidetector CT imaging of the chest was performed following the standard protocol without IV contrast. COMPARISON:  CT Abdomen and Pelvis 08/18/2015. Chest and abdominal radiographs 08/18/2015. FINDINGS: Noncontrast exam. Stable abnormal inferior posterior mediastinum and retrocrural soft tissue as described recently. No pericardial effusion. No pleural effusion. Mediastinal and axillary lymph nodes are normal. Thoracic inlet lymph nodes are normal. Calcified aortic and coronary artery atherosclerosis. Major airways are patent. There are occasional calcified granulomas. There is right upper lobe bronchiectasis with mild patchy peribronchial opacity along the major fissure (series 305, image 16). Mild tree-in-bud nodular opacity along the inferior lateral right upper lobe near the minor fissure (image 24). Minor dependent atelectasis. Degenerative changes in  the thoracic spine. No acute or suspicious osseous lesion in the chest. IMPRESSION: 1. Abnormal posterior mediastinal/retrocrural soft tissue as seen on the recent CT Abdomen and Pelvis. Mediastinal, axillary, and thoracic inlet lymph nodes are normal. 2. Right upper lobe bronchiectasis and peripheral / peribronchial opacity compatible with distal airway infection or less likely scarring. Electronically Signed   By: Genevie Ann M.D.   On: 08/19/2015 23:39   [name] YO Contrast  08/19/2015  CLINICAL DATA:  Headache.  Possible lymphoma. EXAM: MRI HEAD WITHOUT AND WITH CONTRAST TECHNIQUE: Multiplanar, multiecho pulse sequences of the brain and surrounding structures were obtained without and with intravenous contrast. CONTRAST:  52m MULTIHANCE GADOBENATE DIMEGLUMINE 529 MG/ML IV SOLN COMPARISON:  None. FINDINGS: Ventricle size normal. Cerebral volume normal. Pituitary normal in size. Craniocervical junction normal. Negative for acute infarct. Minimal hyperintensity in the periventricular white matter may represent chronic ischemia. Negative for  hemorrhage or fluid collection. Negative for edema in the brain Postcontrast imaging reveals leptomeningeal enhancement diffusely and bilaterally. There is mild smooth enhancement of the meninges without nodularity. No enhancing mass lesion in the brain. IMPRESSION: Diffuse leptomeningeal enhancement. Given the history, this is concerning for lymphomatous involvement. Lumbar puncture with cytology recommended. Negative for acute infarct or mass lesion. Electronically Signed   By: Franchot Gallo M.D.   On: 08/19/2015 20:47   Ct Abdomen Pelvis W Contrast  08/18/2015  CLINICAL DATA:  81 year old female with lower abdominal pain nausea and vomiting for 1 month. Initial encounter. EXAM: CT ABDOMEN AND PELVIS WITH CONTRAST TECHNIQUE: Multidetector CT imaging of the abdomen and pelvis was performed using the standard protocol following bolus administration of intravenous  contrast. CONTRAST:  47m OMNIPAQUE IOHEXOL 300 MG/ML SOLN, 1040mOMNIPAQUE IOHEXOL 300 MG/ML SOLN COMPAR[facility]  [facility] Acute abdominal series from today FINDINGS: Mild respiratory motion artifact at the lung bases. There are chronic surgical clips about the distal thoracic esophagus and gastroesophageal junction. New since 2014 in the posterior mediastinum and tracking toward the retrocrural space is abnormal confluent soft tissue anterior to the thoracic spine and inseparable from the medial wall of the descending thoracic aorta measuring 3 x 44 x 54 mm (AP by transverse by CC). This seems to be separate from both the aorta and esophagus. There is no anterior thoracic spine erosion. Calcified plaque along this segment of the aorta. No associated pericardial or pleural effusion. Mild bronchiectasis at both lung bases with no confluent pulmonary opacity. Degenerative changes in the spine. Chronic 10 mm lucent area in the left T12 vertebral body is stable and most resembled hemangioma in 2014. No acute or suspicious osseous lesion is identified however, there is subtle increased ventral epidural soft tissue seen in the sacrum -sagittal image 73. Mild to moderate nonspecific presacral stranding. No pelvic free fluid. Negative rectum with retained stool. Uterus surgically absent. Adnexa within normal limits. Numerous pelvic phleboliths. Negative urinary bladder. Redundant sigmoid colon. Proximal sigmoid and left colon diverticulosis with no active inflammation identified. Negative transverse colon. Negative right colon. Ileocecal valve lipoma incidentally noted. Appendix diminutive or absent. Negative terminal ileum. No dilated or abnormal small bowel loops. Occasional surgical clips in the greater omentum. Diminutive stomach. Duodenum within normal limits. Major arterial structures are patent in the abdomen and pelvis with fairly extensive calcified aortic atherosclerosis. Portal venous  system appears to be patent. Retroperitoneal lymphadenopathy maximal at the lower lumbar spine level anterior to the IVC measuring up to 22 mm short axis. Numerous increased para renal and other retroperitoneal space nodes are individually up to 14 mm short axis. Mesenteric nodes in the abdomen and pelvis have a more normal appearance. No pelvic sidewall or inguinal lymphadenopathy identified. Solitary small nonspecific low-density area in the right hepatic lobe measuring 10 mm on series 2, image 20, favor benign. Gallbladder not identified and felt to be surgically absent. No splenomegaly or splenic lesion. Negative pancreas and adrenal glands. Bilateral renal enhancement and contrast excretion within normal limits. No abdominal free fluid. IMPRESSION: 1. Retroperitoneal and lower posterior mediastinal / retrocrural soft tissue masses most compatible with lymphadenopathy, and most suggestive of Lymphoma. Some of these might be amenable to CT-guided biopsy, uncertain. 2. Subtle increased sacral epidural soft tissue, but no destructive osseous lesion identified. Nonspecific presacral stranding. Metastatic disease to the spine not excluded. Electronically Signed   By: H Genevie Ann.D.   On: 08/18/2015 18:52   Ct Biopsy  08/21/2015  CLINICAL DATA:  Suspected lymphoma EXAM: CT-GUIDED BIOPSY BONE MARROW AND PARA-AORTIC LYMPH NODE. MEDICATIONS AND MEDICAL HISTORY: Versed 2 mg, Fentanyl 100 mcg. Additional Medications: None. ANESTHESIA/SEDATION: Moderate sedation time: Third minutes PROCEDURE: The procedure, risks, benefits, and alternatives were explained to the patient. Questions regarding the procedure were encouraged and answered. The patient understands and consents to the procedure. The back was prepped with Betadine in a sterile fashion, and a sterile drape was applied covering the operative field. A sterile gown and sterile gloves were used for the procedure. Under CT guidance, an 11 gauge needle was inserted into  the right iliac bone via posterior approach. Aspirates and a core were obtained Under CT guidance, an 17 gauge needle was inserted into the para caval lymph node via right posterior lateral approach. Four 18 gauge core biopsies were obtained. The guide needle was removed. Patient tolerated the procedure well without complication. Vital sign monitoring by nursing staff during the procedure will continue as patient is in the special procedures unit for post procedure observation. FINDINGS: The images document guide needle placement within the right iliac bone and pericaval lymph node. Post biopsy images demonstrate no hemorrhage. COMPLICATIONS: None IMPRESSION: Successful CT-guided bone marrow aspirate, bone marrow core, and pericaval lymph node core which was placed in saline for lymphoma flow analysis. Electronically Signed   By: Marybelle Killings M.D.   On: 08/21/2015 14:10   Ct Biopsy  08/21/2015  CLINICAL DATA:  Suspected lymphoma EXAM: CT-GUIDED BIOPSY BONE MARROW AND PARA-AORTIC LYMPH NODE. MEDICATIONS AND MEDICAL HISTORY: Versed 2 mg, Fentanyl 100 mcg. Additional Medications: None. ANESTHESIA/SEDATION: Moderate sedation time: Third minutes PROCEDURE: The procedure, risks, benefits, and alternatives were explained to the patient. Questions regarding the procedure were encouraged and answered. The patient understands and consents to the procedure. The back was prepped with Betadine in a sterile fashion, and a sterile drape was applied covering the operative field. A sterile gown and sterile gloves were used for the procedure. Under CT guidance, an 11 gauge needle was inserted into the right iliac bone via posterior approach. Aspirates and a core were obtained Under CT guidance, an 17 gauge needle was inserted into the para caval lymph node via right posterior lateral approach. Four 18 gauge core biopsies were obtained. The guide needle was removed. Patient tolerated the procedure well without complication. Vital  sign monitoring by nursing staff during the procedure will continue as patient is in the special procedures unit for post procedure observation. FINDINGS: The images document guide needle placement within the right iliac bone and pericaval lymph node. Post biopsy images demonstrate no hemorrhage. COMPLICATIONS: None IMPRESSION: Successful CT-guided bone marrow aspirate, bone marrow core, and pericaval lymph node core which was placed in saline for lymphoma flow analysis. Electronically Signed   By: Marybelle Killings M.D.   On: 08/21/2015 14:10   Dg Abd Acute W/chest  08/18/2015  CLINICAL DATA:  Chest pain and abdominal pain. EXAM: DG ABDOMEN ACUTE W/ 1V CHEST COMPARISON:  Chest x-ray dated 12/12/2012 performed at [facility]: Heart size and pulmonary vascularity are normal and the lungs are clear. Surgical clips at the gastroesophageal junction. Slight thoracolumbar scoliosis. No free air or free fluid in the abdomen. Bowel gas pattern is normal. Surgical clips and staples in the abdomen. Multiple phleboliths in the pelvis. No acute osseous abnormality. IMPRESSION: Negative abdominal radiographs.  No acute cardiopulmonary disease. Electronically Signed   By: Lorriane Shire M.D.   On: 08/18/2015 17:17    ASSESSMENT &  PLAN:    81 yo caucasian female with   1) High Grade B-cell lymphoma/leukemia (per discussion with pathologist B-ALL vs Burkitts lypmhoma) Patient with Retroperitoneal and Posterior Mediastinal Lnadenopathy with significantly elevated LDH and leukoerythroblastic picture on peipheral blood smear with multiple large atypical Lymphocytes vs blasts. Patient has significant type B constitutional symptoms.  MRI Brain with evidence of leptomeningeal involvement consistent with the patients symptoms of chin numbnes though LP was unrevealing. BM packed with lymphoma/leukemia. LDH down from 3000's to 1900 due to steroids. (tolerating steroids well)  2) Chin numbness due to possible  leptomeningeal involvement. Notes headaches on and off with no other overt focal neurological deficits 3) Thrombocytopenia likely related to lymphoma. 4) Leukoerythroblastic picture likely from bone marrow Lymphoma involvement. 4) Abdominal fatigue/back pain - likely from retroperitoneal LNadenopathy - back pain improved. Plan  -spent significant time with patient and her daughter at bedside discussing possible diagnosis, treatment considerations and initiated goals of care discussion. -I discussed the fact that despite neg LP we are still concerned about leptomeningeal involvement and that sometimes it can take several LP's and possibly leptomeningeal biopsy to have pathological confirmation. Patient is not certain she would want to have that diagnostic workup. -we discussed that treatments available might have initial treatment response but getting to CR status requires aggressive treatments that would likely have significant toxicities. -will likely need systemic and CNS directed therapies. -continue on Allopurinol for TLS prophylaxis given high risk of Tumor lysis syndrome. -continue on dexamethasone 15m IV q6h.  -final pathology result expected today by Dr SMonica Martinezthen will detail treatment options palliative vs somewhat more aggressive treatment options. Based on treatment discussion [facility] -PT/OT evaluation  Plan of care discussed in details with patient and her daughter at bedside.  I spent 35 minutes counseling the patient face to face. The total time spent in the appointment was 45 minutes and more than 50% was on counseling and direct patient cares.    GSullivan LoneMD MBay PinesAAHIVMS SVib[facility] Hospital [facility] CaliforniaCVibra Hospital Of San DiegoWDecatur County Memorial HospitalHematology/Oncology Physician [place] (Office):       [phone](Work cell):  [phone](Fax):           [phone]

## 2015-08-23 NOTE — Progress Notes (Signed)
*  PRELIMINARY RESULTS* Echocardiogram 2D Echocardiogram has been performed.  Leavy Cella 08/23/2015, 9:43 AM

## 2015-08-23 NOTE — Care Management Important Message (Signed)
Important Message  Patient Details  Name: Kristen Baker MRN: [id number] Date of Birth: 1933-07-27   Medicare Important Message Given:  Yes-second notification given    [name] May 08/23/2015, 10:25 AM

## 2015-08-23 NOTE — Progress Notes (Signed)
Physical Therapy Treatment Patient Details Name: Kristen Baker MRN: [id number] DOB: 11/05/1933 Today's Date: 09-22-15    History of Present Illness Patient is a 81 y/o female presens with 1 month history of progressive nausea, vomiting, night sweats, weight loss, lower abdominal pain, headache, chin numbness, and easy brusing. Labs were notable for platelet count of 34; CT of her abdomen and pelvis showed retroperitoneal and lower posterior mediastinal lymphadenopathy suggestive of lymphoma, and a subtle increased sacral epidural soft tissue that cannot be excluded as a spinal metastasis. PMH of cervical ca. Awaiting peripheral blood flow cytometry, Bone Marrow and LN biopsy results 10/13.    PT Comments    Pt making good progress.  Follow Up Recommendations  Home health PT;Supervision - Intermittent     Equipment Recommendations  Rolling walker with 5" wheels;3in1 (PT)    Recommendations for Other Services       Precautions / Restrictions Precautions Precautions: Fall Restrictions Weight Bearing Restrictions: No    Mobility  Bed Mobility               General bed mobility comments: sitting in recliner upon arrival  Transfers Overall transfer level: Modified independent Equipment used: None Transfers: Sit to/from Stand Sit to Stand: Modified independent (Device/Increase time)            Ambulation/Gait Ambulation/Gait assistance: Supervision Ambulation Distance (Feet): 300 Feet Assistive device: Rolling walker (2 wheeled) Gait Pattern/deviations: Step-through pattern;Decreased stride length   Gait velocity interpretation: at or above normal speed for age/gender General Gait Details: Cues to decr speed.   Stairs Stairs: Yes Stairs assistance: Min guard Stair Management: Two rails;Alternating pattern;Forwards Number of Stairs: 5    Wheelchair Mobility    Modified Rankin (Stroke Patients Only)       Balance Overall balance assessment: Needs  assistance Sitting-balance support: No upper extremity supported;Feet supported Sitting balance-Leahy Scale: Good     Standing balance support: No upper extremity supported;During functional activity Standing balance-Leahy Scale: Fair                      Cognition Arousal/Alertness: Awake/alert Behavior During Therapy: WFL for tasks assessed/performed Overall Cognitive Status: Within Functional Limits for tasks assessed                      Exercises      General Comments        Pertinent Vitals/Pain Pain Assessment: No/denies pain    Home Living                      Prior Function            PT Goals (current goals can now be found in the care plan section) Progress towards PT goals: Progressing toward goals    Frequency  Min 3X/week    PT Plan Current plan remains appropriate    Co-evaluation             End of Session Equipment Utilized During Treatment: Gait belt Activity Tolerance: Patient tolerated treatment well Patient left: in chair;with call bell/phone within reach     Time: 1042-1051 PT Time Calculation (min) (ACUTE ONLY): 9 min  Charges:  $Gait Training: 8-22 mins                    G Codes:      Kristen Baker 09-22-15, 11:07 AM Suanne Marker PT [phone]

## 2015-08-23 NOTE — Progress Notes (Signed)
Subjective: Kristen Baker.  Patient states she is doing well. She denies headache and is regaining strength as well as appetite.  She spoke with Dr. Irene Limbo yesterday who outlined preliminary treatment options.  Patient has not decided whether she wants to undergo symptomatic treatment vs less aggressive vs more aggressive intervention.  She currently has no questions or complaints.  Objective: Vital signs in last 24 hours: Filed Vitals:   08/22/15 1405 08/22/15 2158 08/23/15 0640 08/23/15 1331  BP: 127/45 138/45 138/58 130/54  Pulse: 67 67 57 67  Temp: 98.2 F (36.8 C) 97.7 F (36.5 C) 98 F (36.7 C) 98 F (36.7 C)  TempSrc: Oral Oral Oral Oral  Resp: _0 Height:      Weight:   174 lb 9.7 oz (79.2 kg)   SpO2: 95% 95% 94% 95%   Weight change: -1 lb 1.5 oz (-0.497 kg)  Intake/Output Summary (Last 24 hours) at 08/23/15 1357 Last data filed at 08/23/15 0959  Gross per 24 hour  Intake    460 ml  Output   1950 ml  Net  -1490 ml   Physical Exam  Constitutional: She is oriented to person, place, and time and well-developed, well-nourished, and in no distress. No distress.  Elderly female, reading in chair.  NAD  HENT:  Head: Normocephalic and atraumatic.  Eyes: EOM are normal. No scleral icterus.  Neck: No JVD present. No tracheal deviation present.  Cardiovascular: Normal rate, regular rhythm, normal heart sounds and intact distal pulses.   Pulmonary/Chest: Effort normal. No respiratory distress. She has no wheezes.  CTAB.  Abdominal: Soft. She exhibits no distension. There is no tenderness. There is no rebound and no guarding.  Musculoskeletal: She exhibits no edema.  Neurological: She is alert and oriented to person, place, and time.  Skin: Skin is warm and dry. No rash noted. She is not diaphoretic.  Biopsy sites on lumbar and presacral areas covered with bandaid without drainage or signs of infection.    Lab Results: Basic Metabolic Panel:  Recent Labs Lab  08/18/15 2128  08/21/15 0607 08/22/15 0815 08/22/15 1630  NA  --   < > 137 140  --   K  --   < > 4.0 4.1  --   CL  --   < > 98* 99*  --   CO2  --   < > 26 27  --   GLUCOSE  --   < > 174* 168*  --   BUN  --   < > 15 25*  --   CREATININE  --   < > 0.99 0.87  --   CALCIUM  --   < > 8.5* 8.9  --   MG 1.7  --   --   --   --   PHOS  --   --   --   --  4.0  < > = values in this interval not displayed. Liver Function Tests:  Recent Labs Lab 08/18/15 1408 08/19/15 0458  AST 69* 61*  ALT 16 16  ALKPHOS 95 88  BILITOT 0.8 0.6  PROT 5.7* 5.4*  ALBUMIN 3.2* 2.9*    Recent Labs Lab 08/18/15 1408  LIPASE 23   No results for input(s): AMMONIA in the last 168 hours. CBC:  Recent Labs Lab 08/19/15 0458  08/21/15 0607 08/21/15 1032 08/22/15 0815  WBC 7.9  < > 7.9 8.2 6.2  NEUTROABS 4.1  --  3.8  --   --  HGB 11.2*  < > 11.8* 12.4 10.6*  HCT 34.3*  < > 35.3* 36.4 32.2*  MCV 88.2  < > 87.6 86.1 86.3  PLT 61*  < > 35* 59* 47*  < > = values in this interval not displayed. Cardiac Enzymes: No results for input(s): CKTOTAL, CKMB, CKMBINDEX, TROPONINI in the last 168 hours. BNP: No results for input(s): PROBNP in the last 168 hours. D-Dimer:  Recent Labs Lab 08/19/15 1905  DDIMER 1.94*   CBG: No results for input(s): GLUCAP in the last 168 hours. Hemoglobin A1C: No results for input(s): HGBA1C in the last 168 hours. Fasting Lipid Panel: No results for input(s): CHOL, HDL, LDLCALC, TRIG, CHOLHDL, LDLDIRECT in the last 168 hours. Thyroid Function Tests: No results for input(s): TSH, T4TOTAL, FREET4, T3FREE, THYROIDAB in the last 168 hours. Coagulation:  Recent Labs Lab 08/18/15 1824 08/19/15 0458  LABPROT 14.8 15.7*  INR 1.15 1.23   Anemia Panel:  Recent Labs Lab 08/19/15 1905 08/20/15 0727  VITAMINB12  --  782  RETICCTPCT 0.7  --    Urine Drug Screen: Drugs of Abuse  No results found for: LABOPIA, COCAINSCRNUR, LABBENZ, AMPHETMU, THCU, LABBARB  Alcohol  Level: No results for input(s): ETH in the last 168 hours. Urinalysis:  Recent Labs Lab 08/18/15 1600  COLORURINE YELLOW  LABSPEC 1.015  PHURINE 6.0  GLUCOSEU NEGATIVE  HGBUR TRACE*  BILIRUBINUR NEGATIVE  KETONESUR 15*  PROTEINUR NEGATIVE  UROBILINOGEN 0.2  NITRITE NEGATIVE  LEUKOCYTESUR NEGATIVE   Misc. Labs:   Micro Results: Recent Results (from the past 240 hour(s))  CSF culture with Stat gram stain     Status: None   Collection Time: 08/20/15  4:55 PM  Result Value Ref Range Status   Specimen Description CSF  Final   Special Requests NONE  Final   Gram Stain   Final    WBC PRESENT, PREDOMINANTLY MONONUCLEAR NO ORGANISMS SEEN CYTOSPIN    Culture NO GROWTH 3 DAYS  Final   Report Status 08/23/2015 FINAL  Final   Studies/Results: No results found. Medications: I have reviewed the patient's current medications. Scheduled Meds: . sodium chloride   Intravenous Once  . allopurinol  100 mg Oral BID  . dexamethasone  4 mg Intravenous 4 times per day  . pantoprazole  40 mg Oral Daily  . polyethylene glycol  17 g Oral Daily  . Vitamin D (Ergocalciferol)  50,000 Units Oral Q7 days   Continuous Infusions:  PRN Meds:.iohexol, promethazine, traMADol Assessment/Plan: Principal Problem:   Retroperitoneal lymphadenopathy Active Problems:   Thrombocytopenia (HCC)   Weight loss   Generalized weakness   Vitamin B12 deficiency   GERD (gastroesophageal reflux disease)   Hx of gastric ulcer   Hx of cervical cancer   Hypokalemia   Headache   At high risk of tumor lysis syndrome   Numbness of face  [name] is a pleasant 81 year old lady presenting with a one-month history of B-symptoms, lower band-like abdominal pain, headaches, and chin numbness, found to have severe thrombocytopenia and retroperitoneal lymphadenopathy concerning for lymphoma and possibly a spinal metastasis.   Retroperitoneal lymphadenopathy: Patient with undiagnosed etiology of persistent anemia and  now thrombocytopenia has had B symptoms and imaging suggestive of lymphoma. Notes from [facility] in July describe treatment for persistent anemia with normal platelet count.  Therefore, her current thrombocytopenia appears to be a more acute development.   Given her prominent lymphadenopathy without splenomegaly, there is now concern for bone marrow involvement.  Patient  also complaining of HA and bilateral numb chin.  Numb chin syndrome is often 2/2 CNS involvement of lymphoma, and MRI concerning for leptomeningeal involvement.  Per Onc, LP can have false negative in 30% of patients. Heme-Onc has been consulted with ddx including Burkitts vs B-ALL.  She does not appear to need further diagnostic testing while she is here.  - Heme/Onc consult, she should receive her first dose of chemo while inpatient and PET/CT for complete staging.  Port placement recommended, though unclear whether patient has agreed to treatment.  Will follow next Heme/Onc note for shared-decision making conversation before ordering Port. - CT chest: lymphadenopathy with possible distal airway infection (no pulmonary symptoms at this time; will CTM for fever or pulmonary complaint) - MRI brain: diffuse leptomeningeal enhancement - Lumbar puncture with low cell count, normal protein, normal glucose.  No organisms on Gram stain.  WBC present. - Echo with EF 65-70% and Grade 1 diastolic dysfunction <[id number]>_2<\/HENIDPOEUMPNTIRW>_4  LN biopsy results: c/w B-ALL or Burkitts.  _1  Bone marrow biopsy results: BM nearly completely replaced by abnormal cells _2  Peripheral blood flow cytometry and FISH _3  Port placement for future chemotherapy: patient still contemplative regarding symptomatic treatment vs more aggressive treatment. Will defer placing Port until patient preference decided. - Dexamethasone 4 mg IV q6hours - Allopurinol   Thrombocytopenia, stable: Likely 2/2 etiology underlying retroperitoneal LAD.  She is s/p 1 U plts with appropriate response.   Will continue to monitor pending Heme/Onc evaluation. -Peripheral smear with left shift and polychromasia, but no schistocytes -EBV negative -Hepatitis labs negative -HIV negative -HTLV: negative  Abdominal pain and nausea, improved: Per above, likely related to her lymphadenopathy. Urinalysis was clean and she has been having normal bowel movements.  -Tramadol as needed - Phenergan as needed  Headache, improved: Likely 2/2 etiology underlying her retroperitoneal LAD.  Considering contrast MRI and LP as above. -Tramadol as needed - Dexamethasone  GERD: omeprazole  Dispo: Disposition is deferred at this time, awaiting improvement of current medical problems.    The patient does have a current PCP (Dr. Cyndy Freeze, [place]) and does need an [facility] hospital follow-up appointment after discharge.  The patient does not have transportation limitations that hinder transportation to clinic appointments.  .Services Needed at time of discharge: Y = Yes, Blank = No PT:  Home Health PT  OT:   RN:   Equipment:   Other:     LOS: 3 days   Iline Oven, MD 08/23/2015, 1:57 PM

## 2015-08-24 ENCOUNTER — Other Ambulatory Visit: Payer: Self-pay | Admitting: Hematology

## 2015-08-24 DIAGNOSIS — C8370 Burkitt lymphoma, unspecified site: Secondary | ICD-10-CM

## 2015-08-24 DIAGNOSIS — K5900 Constipation, unspecified: Secondary | ICD-10-CM | POA: Insufficient documentation

## 2015-08-24 LAB — CBC WITH DIFFERENTIAL/PLATELET
BASOS ABS: 0 10*3/uL (ref 0.0–0.1)
Basophils Relative: 1 %
EOS ABS: 0 10*3/uL (ref 0.0–0.7)
Eosinophils Relative: 0 %
HCT: 29.8 % — ABNORMAL LOW (ref 36.0–46.0)
Hemoglobin: 10 g/dL — ABNORMAL LOW (ref 12.0–15.0)
LYMPHS ABS: 0.8 10*3/uL (ref 0.7–4.0)
Lymphocytes Relative: 29 %
MCH: 29.2 pg (ref 26.0–34.0)
MCHC: 33.6 g/dL (ref 30.0–36.0)
MCV: 87.1 fL (ref 78.0–100.0)
MONO ABS: 0.6 10*3/uL (ref 0.1–1.0)
MONOS PCT: 20 %
NEUTROS ABS: 1.5 10*3/uL — AB (ref 1.7–7.7)
Neutrophils Relative %: 50 %
PLATELETS: 38 10*3/uL — AB (ref 150–400)
RBC: 3.42 MIL/uL — AB (ref 3.87–5.11)
RDW: 16.5 % — ABNORMAL HIGH (ref 11.5–15.5)
WBC: 2.9 10*3/uL — AB (ref 4.0–10.5)

## 2015-08-24 LAB — COMPREHENSIVE METABOLIC PANEL
ALT: 22 U/L (ref 14–54)
ANION GAP: 10 (ref 5–15)
AST: 27 U/L (ref 15–41)
Albumin: 3 g/dL — ABNORMAL LOW (ref 3.5–5.0)
Alkaline Phosphatase: 67 U/L (ref 38–126)
BUN: 19 mg/dL (ref 6–20)
CALCIUM: 8.1 mg/dL — AB (ref 8.9–10.3)
CHLORIDE: 98 mmol/L — AB (ref 101–111)
CO2: 28 mmol/L (ref 22–32)
CREATININE: 0.75 mg/dL (ref 0.44–1.00)
Glucose, Bld: 167 mg/dL — ABNORMAL HIGH (ref 65–99)
Potassium: 4.6 mmol/L (ref 3.5–5.1)
Sodium: 136 mmol/L (ref 135–145)
Total Bilirubin: 0.4 mg/dL (ref 0.3–1.2)
Total Protein: 5 g/dL — ABNORMAL LOW (ref 6.5–8.1)

## 2015-08-24 LAB — APTT: aPTT: 25 seconds (ref 24–37)

## 2015-08-24 LAB — PROTIME-INR
INR: 1.14 (ref 0.00–1.49)
PROTHROMBIN TIME: 14.8 s (ref 11.6–15.2)

## 2015-08-24 LAB — LACTATE DEHYDROGENASE: LDH: 1215 U/L — AB (ref 98–192)

## 2015-08-24 NOTE — Progress Notes (Signed)
Patient ID: Kristen Baker, female   DOB: [birth year], 81 y.o.   MRN: [id number]  IR aware of request for [facility] placement Note says to HOLD for now  Will await recommendation or new order PAC could be placed Mon 08/26/15 Not performed on weekends

## 2015-08-24 NOTE — Progress Notes (Signed)
Kristen Baker   HEMATOLOGY/ONCOLOGY INPATIENT PROGRESS NOTE  Date of Service: 08/23/2015  Inpatient Attending: .Michel Bickers, MD  SUBJECTIVE  I met with Kristen Baker and her family (daughter, son and daughter in law). We had a frank and extensive discussion regarding diagnosis, prognosis, treatment options, treatment toxicities and goals of care. Kristen Baker notes she is feeling well and that the steroids have really boosted her appetite. She notes that she is not keen to seek treatment elsewhere when offered the option.  OBJECTIVE:  Resting in bed  PHYSICAL EXAMINATION: . Filed Vitals:   08/23/15 0640 08/23/15 1331 08/23/15 2221 08/24/15 0546  BP: 138/58 130/54 153/59 141/55  Pulse: 57 67 57 57  Temp: 98 F (36.7 C) 98 F (36.7 C) 97.5 F (36.4 C) 97.8 F (36.6 C)  TempSrc: Oral Oral Oral Oral  Resp: $Remo'18 16 18 18  'DKrPT$ Height:      Weight: 174 lb 9.7 oz (79.2 kg)   176 lb (79.833 kg)  SpO2: 94% 95% 97% 95%   Filed Weights   08/22/15 0551 08/23/15 0640 08/24/15 0546  Weight: 175 lb 11.2 oz (79.697 kg) 174 lb 9.7 oz (79.2 kg) 176 lb (79.833 kg)   .Body mass index is 27.4 kg/(m^2).  GENERAL:alert, in no acute distress and comfortable SKIN: skin color, texture, turgor are normal, no rashes or significant lesions EYES: normal, conjunctiva are pink and non-injected, sclera clear OROPHARYNX:no exudate, no erythema and lips, buccal mucosa, and tongue normal  NECK: supple, no JVD, thyroid normal size, non-tender, without nodularity LYMPH: no palpable lymphadenopathy in the cervical, axillary or inguinal LUNGS: clear to auscultation with normal respiratory effort HEART: regular rate & rhythm, no murmurs and no lower extremity edema ABDOMEN: abdomen soft, non-tender, normoactive bowel sounds  Musculoskeletal: no cyanosis of digits and no clubbing  PSYCH: alert & oriented x 3 with fluent speech NEURO: no focal motor/sensory deficits.   MEDICAL HISTORY:  Past Medical History  Diagnosis  Date  . Cancer [facility])     cervical cancer    SURGICAL HISTORY: Past Surgical History  Procedure Laterality Date  . Cholecystectomy    . Tonsillectomy    . Abdominal hysterectomy    . Abdominal surgery      2-3 rd of colon removed    SOCIAL HISTORY: Social History   Social History  . Marital Status: Married    Spouse Name: N/A  . Number of Children: N/A  . Years of Education: N/A   Occupational History  . Not on file.   Social History Main Topics  . Smoking status: Never Smoker   . Smokeless tobacco: Not on file  . Alcohol Use: No  . Drug Use: No  . Sexual Activity: Not on file   Other Topics Concern  . Not on file   Social History Narrative    FAMILY HISTORY: History reviewed. No pertinent family history.  ALLERGIES:  is allergic to cabbage; onion; shellfish allergy; and sulfa antibiotics.  MEDICATIONS:  Scheduled Meds: . sodium chloride   Intravenous Once  . allopurinol  100 mg Oral BID  . dexamethasone  4 mg Intravenous 4 times per day  . pantoprazole  40 mg Oral Daily  . polyethylene glycol  17 g Oral Daily  . senna-docusate  2 tablet Oral QHS  . Vitamin D (Ergocalciferol)  50,000 Units Oral Q7 days   Continuous Infusions:  PRN Meds:.iohexol, promethazine, traMADol  REVIEW OF SYSTEMS:    10 Point review of Systems was done is negative except  as noted above.   LABORATORY DATA:  I have reviewed the data as listed  . CBC Latest Ref Rng 08/24/2015 08/22/2015 08/21/2015  WBC 4.0 - 10.5 K/uL 2.9(L) 6.2 8.2  Hemoglobin 12.0 - 15.0 g/dL 10.0(L) 10.6(L) 12.4  Hematocrit 36.0 - 46.0 % 29.8(L) 32.2(L) 36.4  Platelets 150 - 400 K/uL 38(L) 47(L) 59(L)    . CMP Latest Ref Rng 08/24/2015 08/22/2015 08/21/2015  Glucose 65 - 99 mg/dL 971(M) 749(P) 281(Q)  BUN 6 - 20 mg/dL 19 72(B) 15  Creatinine 0.44 - 1.00 mg/dL 5.49 6.32 1.12  Sodium 135 - 145 mmol/L 136 140 137  Potassium 3.5 - 5.1 mmol/L 4.6 4.1 4.0  Chloride 101 - 111 mmol/L 98(L) 99(L) 98(L)    CO2 22 - 32 mmol/L 28 27 26   Calcium 8.9 - 10.3 mg/dL 8.1(L) 8.9 8.5(L)  Total Protein 6.5 - 8.1 g/dL 5.0(L) - -  Total Bilirubin 0.3 - 1.2 mg/dL 0.4 - -  Alkaline Phos 38 - 126 U/L 67 - -  AST 15 - 41 U/L 27 - -  ALT 14 - 54 U/L 22 - -   . Lab Results  Component Value Date   LDH 1215* 08/24/2015     RADIOGRAPHIC STUDIES: I have personally reviewed the radiological images as listed and agreed with the findings in the report. Ct Chest Wo Contrast  08/19/2015  CLINICAL DATA:  81 year old female with abnormal CT Abdomen and Pelvis, possible lymphoma. Initial encounter. EXAM: CT CHEST WITHOUT CONTRAST TECHNIQUE: Multidetector CT imaging of the chest was performed following the standard protocol without IV contrast. COMPARISON:  CT Abdomen and Pelvis 08/18/2015. Chest and abdominal radiographs 08/18/2015. FINDINGS: Noncontrast exam. Stable abnormal inferior posterior mediastinum and retrocrural soft tissue as described recently. No pericardial effusion. No pleural effusion. Mediastinal and axillary lymph nodes are normal. Thoracic inlet lymph nodes are normal. Calcified aortic and coronary artery atherosclerosis. Major airways are patent. There are occasional calcified granulomas. There is right upper lobe bronchiectasis with mild patchy peribronchial opacity along the major fissure (series 305, image 16). Mild tree-in-bud nodular opacity along the inferior lateral right upper lobe near the minor fissure (image 24). Minor dependent atelectasis. Degenerative changes in the thoracic spine. No acute or suspicious osseous lesion in the chest. IMPRESSION: 1. Abnormal posterior mediastinal/retrocrural soft tissue as seen on the recent CT Abdomen and Pelvis. Mediastinal, axillary, and thoracic inlet lymph nodes are normal. 2. Right upper lobe bronchiectasis and peripheral / peribronchial opacity compatible with distal airway infection or less likely scarring. Electronically Signed   By: 10/18/2015 M.D.   On:  08/19/2015 23:39   Mr 10/19/2015 Laqueta Jean Contrast  08/19/2015  CLINICAL DATA:  Headache.  Possible lymphoma. EXAM: MRI HEAD WITHOUT AND WITH CONTRAST TECHNIQUE: Multiplanar, multiecho pulse sequences of the brain and surrounding structures were obtained without and with intravenous contrast. CONTRAST:  11mL MULTIHANCE GADOBENATE DIMEGLUMINE 529 MG/ML IV SOLN COMPARISON:  None. FINDINGS: Ventricle size normal. Cerebral volume normal. Pituitary normal in size. Craniocervical junction normal. Negative for acute infarct. Minimal hyperintensity in the periventricular white matter may represent chronic ischemia. Negative for hemorrhage or fluid collection. Negative for edema in the brain Postcontrast imaging reveals leptomeningeal enhancement diffusely and bilaterally. There is mild smooth enhancement of the meninges without nodularity. No enhancing mass lesion in the brain. IMPRESSION: Diffuse leptomeningeal enhancement. Given the history, this is concerning for lymphomatous involvement. Lumbar puncture with cytology recommended. Negative for acute infarct or mass lesion. Electronically Signed   By: 30m  Carlis Abbott M.D.   On: 08/19/2015 20:47   Ct Abdomen Pelvis W Contrast  08/18/2015  CLINICAL DATA:  81 year old female with lower abdominal pain nausea and vomiting for 1 month. Initial encounter. EXAM: CT ABDOMEN AND PELVIS WITH CONTRAST TECHNIQUE: Multidetector CT imaging of the abdomen and pelvis was performed using the standard protocol following bolus administration of intravenous contrast. CONTRAST:  67mL OMNIPAQUE IOHEXOL 300 MG/ML SOLN, 113mL OMNIPAQUE IOHEXOL 300 MG/ML SOLN COMPARISON:  [facility] Chest CTA 11/20/2012. Acute abdominal series from today FINDINGS: Mild respiratory motion artifact at the lung bases. There are chronic surgical clips about the distal thoracic esophagus and gastroesophageal junction. New since 2014 in the posterior mediastinum and tracking toward the retrocrural space is abnormal  confluent soft tissue anterior to the thoracic spine and inseparable from the medial wall of the descending thoracic aorta measuring 3 x 44 x 54 mm (AP by transverse by CC). This seems to be separate from both the aorta and esophagus. There is no anterior thoracic spine erosion. Calcified plaque along this segment of the aorta. No associated pericardial or pleural effusion. Mild bronchiectasis at both lung bases with no confluent pulmonary opacity. Degenerative changes in the spine. Chronic 10 mm lucent area in the left T12 vertebral body is stable and most resembled hemangioma in 2014. No acute or suspicious osseous lesion is identified however, there is subtle increased ventral epidural soft tissue seen in the sacrum -sagittal image 73. Mild to moderate nonspecific presacral stranding. No pelvic free fluid. Negative rectum with retained stool. Uterus surgically absent. Adnexa within normal limits. Numerous pelvic phleboliths. Negative urinary bladder. Redundant sigmoid colon. Proximal sigmoid and left colon diverticulosis with no active inflammation identified. Negative transverse colon. Negative right colon. Ileocecal valve lipoma incidentally noted. Appendix diminutive or absent. Negative terminal ileum. No dilated or abnormal small bowel loops. Occasional surgical clips in the greater omentum. Diminutive stomach. Duodenum within normal limits. Major arterial structures are patent in the abdomen and pelvis with fairly extensive calcified aortic atherosclerosis. Portal venous system appears to be patent. Retroperitoneal lymphadenopathy maximal at the lower lumbar spine level anterior to the IVC measuring up to 22 mm short axis. Numerous increased para renal and other retroperitoneal space nodes are individually up to 14 mm short axis. Mesenteric nodes in the abdomen and pelvis have a more normal appearance. No pelvic sidewall or inguinal lymphadenopathy identified. Solitary small nonspecific low-density area in  the right hepatic lobe measuring 10 mm on series 2, image 20, favor benign. Gallbladder not identified and felt to be surgically absent. No splenomegaly or splenic lesion. Negative pancreas and adrenal glands. Bilateral renal enhancement and contrast excretion within normal limits. No abdominal free fluid. IMPRESSION: 1. Retroperitoneal and lower posterior mediastinal / retrocrural soft tissue masses most compatible with lymphadenopathy, and most suggestive of Lymphoma. Some of these might be amenable to CT-guided biopsy, uncertain. 2. Subtle increased sacral epidural soft tissue, but no destructive osseous lesion identified. Nonspecific presacral stranding. Metastatic disease to the spine not excluded. Electronically Signed   By: Genevie Ann M.D.   On: 08/18/2015 18:52   Ct Biopsy  08/21/2015  CLINICAL DATA:  Suspected lymphoma EXAM: CT-GUIDED BIOPSY BONE MARROW AND PARA-AORTIC LYMPH NODE. MEDICATIONS AND MEDICAL HISTORY: Versed 2 mg, Fentanyl 100 mcg. Additional Medications: None. ANESTHESIA/SEDATION: Moderate sedation time: Third minutes PROCEDURE: The procedure, risks, benefits, and alternatives were explained to the patient. Questions regarding the procedure were encouraged and answered. The patient understands and consents to the procedure. The back was prepped  with Betadine in a sterile fashion, and a sterile drape was applied covering the operative field. A sterile gown and sterile gloves were used for the procedure. Under CT guidance, an 11 gauge needle was inserted into the right iliac bone via posterior approach. Aspirates and a core were obtained Under CT guidance, an 17 gauge needle was inserted into the para caval lymph node via right posterior lateral approach. Four 18 gauge core biopsies were obtained. The guide needle was removed. Patient tolerated the procedure well without complication. Vital sign monitoring by nursing staff during the procedure will continue as patient is in the special  procedures unit for post procedure observation. FINDINGS: The images document guide needle placement within the right iliac bone and pericaval lymph node. Post biopsy images demonstrate no hemorrhage. COMPLICATIONS: None IMPRESSION: Successful CT-guided bone marrow aspirate, bone marrow core, and pericaval lymph node core which was placed in saline for lymphoma flow analysis. Electronically Signed   By: Marybelle Killings M.D.   On: 08/21/2015 14:10   Ct Biopsy  08/21/2015  CLINICAL DATA:  Suspected lymphoma EXAM: CT-GUIDED BIOPSY BONE MARROW AND PARA-AORTIC LYMPH NODE. MEDICATIONS AND MEDICAL HISTORY: Versed 2 mg, Fentanyl 100 mcg. Additional Medications: None. ANESTHESIA/SEDATION: Moderate sedation time: Third minutes PROCEDURE: The procedure, risks, benefits, and alternatives were explained to the patient. Questions regarding the procedure were encouraged and answered. The patient understands and consents to the procedure. The back was prepped with Betadine in a sterile fashion, and a sterile drape was applied covering the operative field. A sterile gown and sterile gloves were used for the procedure. Under CT guidance, an 11 gauge needle was inserted into the right iliac bone via posterior approach. Aspirates and a core were obtained Under CT guidance, an 17 gauge needle was inserted into the para caval lymph node via right posterior lateral approach. Four 18 gauge core biopsies were obtained. The guide needle was removed. Patient tolerated the procedure well without complication. Vital sign monitoring by nursing staff during the procedure will continue as patient is in the special procedures unit for post procedure observation. FINDINGS: The images document guide needle placement within the right iliac bone and pericaval lymph node. Post biopsy images demonstrate no hemorrhage. COMPLICATIONS: None IMPRESSION: Successful CT-guided bone marrow aspirate, bone marrow core, and pericaval lymph node core which was  placed in saline for lymphoma flow analysis. Electronically Signed   By: Marybelle Killings M.D.   On: 08/21/2015 14:10   Dg Abd Acute W/chest  08/18/2015  CLINICAL DATA:  Chest pain and abdominal pain. EXAM: DG ABDOMEN ACUTE W/ 1V CHEST COMPARISON:  Chest x-ray dated 12/12/2012 performed at [facility]: Heart size and pulmonary vascularity are normal and the lungs are clear. Surgical clips at the gastroesophageal junction. Slight thoracolumbar scoliosis. No free air or free fluid in the abdomen. Bowel gas pattern is normal. Surgical clips and staples in the abdomen. Multiple phleboliths in the pelvis. No acute osseous abnormality. IMPRESSION: Negative abdominal radiographs.  No acute cardiopulmonary disease. Electronically Signed   By: Lorriane Shire M.D.   On: 08/18/2015 17:17   ECHO 08/23/2015:  Study Conclusions  - Left ventricle: The cavity size was normal. There was moderate concentric hypertrophy. Systolic function was vigorous. The estimated ejection fraction was in the range of 65% to 70%. Wall motion was normal; there were no regional wall motion abnormalities. Doppler parameters are consistent with abnormal left ventricular relaxation (grade 1 diastolic dysfunction). Doppler parameters are consistent with elevated ventricular end-diastolic filling pressure. -  Aortic root: The aortic root was normal in size. - Mitral valve: There was no regurgitation. - Left atrium: The atrium was normal in size. - Right ventricle: Systolic function was normal. - Right atrium: The atrium was normal in size. - Tricuspid valve: There was no regurgitation. - Pulmonic valve: There was no regurgitation. - Pulmonary arteries: Systolic pressure was within the normal range. - Inferior vena cava: The vessel was normal in size. - Pericardium, extracardiac: There was no pericardial effusion.          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 Patient has significant type B constitutional symptoms.  MRI Brain with evidence of leptomeningeal involvement consistent with the patients symptoms of chin numbnes though LP was unrevealing. BM packed with lymphoma/leukemia. LDH down from 3000's to 1900 to 1215 due to steroids. (tolerating steroids well)  2) Chin numbness due to possible leptomeningeal involvement. Notes headaches on and off with no other overt focal neurological deficits 3) Thrombocytopenia likely related to lymphoma. 4) Leukoerythroblastic picture likely from bone marrow Lymphoma involvement. 4) Abdominal fatigue/back pain - likely from retroperitoneal LNadenopathy - back pain improved. 5) Hypogammaglobulinemia due to [place]  -spent significant time with patient and her family at bedside discussing possible diagnosis,progrnosis, treatment options, concern about the adverse prognosis with cns involvement. -given option to consider transfer to [facility] to a large university setting but chooses to seek help here. -Offered her dose reduced EPOCH-R regimen for systemic disease control. -we discussed consideration of leptomeningeal biopsy to prove leptomeningeal lymphoma involvement but decided against it due to the invasive nature of the procedure and the additional limitation caused by her thrombocytopenia -we discussed options for CNS directed therapies including IT treatment through LP with additional diagnostic sampling vs Consideration of [place] reservoir vs IV HD Methotrexate. -we decided to proceed with initial IT Methotrexate through LP for the first few doses with additional sampling of CSF (for diagnosis) and then adjust  reassess. -her pre-treatment cytopenia might be treatment limiting but we hope they improve as her marrow clears. -will need PLTs>50K for IT treatments (will prefer irradiated products)  -patient agreeable to port placement (ordered to be done ASAP) will need pre-procedure platelets -PET/CT ordered stat to be done as inpatient since patient cannot be safetly discharged prior to treatment.  -continue on Allopurinol for TLS prophylaxis given high risk of Tumor lysis syndrome with daily tumor lysis labs (cmp, uric acid, phosphorus after treatment started) -will intend to start EPOCH-R +  IT methotrexate on monday10/17/2016 after port placement and PEt/CT. -will need to be transferred to [facility] for the chemotherapy. -continue on dexamethasone $RemoveBeforeDE'4mg'[id number]$  IV q6h for now with GI prophylaxis -continue PT and encourage frequent ambulation. -Social worker consultation to help with POA paperwork as per daughters request.  Plan of care discussed in details with patient and her family at bedside.  I spent 60 minutes counseling the patient face to face. The total time spent in the appointment was 75 minutes and more than 50% was on counseling and direct patient cares.    Sullivan Lone MD [place] AAHIVMS [facility] Hematology/Oncology Physician [place]  (Office):       [phone] (Work cell):  [phone] (Fax):  [phone]

## 2015-08-24 NOTE — Progress Notes (Signed)
Pt being transferred to [facility]. Report given to [employer]. Pt left the floor via [facility] in stable condition.

## 2015-08-24 NOTE — Progress Notes (Addendum)
Subjective:  VSS.  No events overnight.  Kristen Baker is doing well this AM and has no complaints.  Denies HA or N/V.  Does still report chin numbness.  She would like to get up and move around which I encouraged.  She expressed a concern regarding hair loss with chemotherapy.  She will likely get a port placed on Monday.    Overall, she is very appreciate of the help she has received in the hospital.     Objective: Vital signs in last 24 hours: Filed Vitals:   08/23/15 0640 08/23/15 1331 08/23/15 2221 08/24/15 0546  BP: 138/58 130/54 153/59 141/55  Pulse: 57 67 57 57  Temp: 98 F (36.7 C) 98 F (36.7 C) 97.5 F (36.4 C) 97.8 F (36.6 C)  TempSrc: Oral Oral Oral Oral  Resp: 18 16 18 18   Height:      Weight: 174 lb 9.7 oz (79.2 kg)   176 lb (79.833 kg)  SpO2: 94% 95% 97% 95%   Weight change: 1 lb 6.3 oz (0.633 kg)  Intake/Output Summary (Last 24 hours) at 08/24/15 1013 Last data filed at 08/24/15 0747  Gross per 24 hour  Intake    250 ml  Output   2150 ml  Net  -1900 ml   Physical Exam  Constitutional: She is oriented to person, place, and time and well-developed, well-nourished, and in no distress. No distress.  Elderly female, reading in chair.  NAD  HENT:  Head: Normocephalic and atraumatic.  Eyes: EOM are normal. No scleral icterus.  Neck: No JVD present. No tracheal deviation present.  Cardiovascular: Normal rate, regular rhythm, normal heart sounds and intact distal pulses.   Pulmonary/Chest: Effort normal. No respiratory distress. She has no wheezes.  CTAB.  Abdominal: Soft. She exhibits no distension. There is no tenderness. There is no rebound and no guarding.  Musculoskeletal: She exhibits no edema.  Neurological: She is alert and oriented to person, place, and time.  Skin: Skin is warm and dry. No rash noted. She is not diaphoretic.  Biopsy sites on lumbar and presacral areas covered with bandaid without drainage or signs of infection.    Lab  Results: Basic Metabolic Panel:  Recent Labs Lab 08/18/15 2128  08/22/15 0815 08/22/15 1630 08/24/15 0515  NA  --   < > 140  --  136  K  --   < > 4.1  --  4.6  CL  --   < > 99*  --  98*  CO2  --   < > 27  --  28  GLUCOSE  --   < > 168*  --  167*  BUN  --   < > 25*  --  19  CREATININE  --   < > 0.87  --  0.75  CALCIUM  --   < > 8.9  --  8.1*  MG 1.7  --   --   --   --   PHOS  --   --   --  4.0  --   < > = values in this interval not displayed. Liver Function Tests:  Recent Labs Lab 08/19/15 0458 08/24/15 0515  AST 61* 27  ALT 16 22  ALKPHOS 88 67  BILITOT 0.6 0.4  PROT 5.4* 5.0*  ALBUMIN 2.9* 3.0*    Recent Labs Lab 08/18/15 1408  LIPASE 23   No results for input(s): AMMONIA in the last 168 hours. CBC:  Recent Labs Lab 08/21/15 [phone]  08/22/15 0815 08/24/15 0515  WBC 7.9  < > 6.2 2.9*  NEUTROABS 3.8  --   --  1.5*  HGB 11.8*  < > 10.6* 10.0*  HCT 35.3*  < > 32.2* 29.8*  MCV 87.6  < > 86.3 87.1  PLT 35*  < > 47* 38*  < > = values in this interval not displayed. Cardiac Enzymes: No results for input(s): CKTOTAL, CKMB, CKMBINDEX, TROPONINI in the last 168 hours. BNP: No results for input(s): PROBNP in the last 168 hours. D-Dimer:  Recent Labs Lab 08/19/15 1905  DDIMER 1.94*   CBG: No results for input(s): GLUCAP in the last 168 hours. Hemoglobin A1C: No results for input(s): HGBA1C in the last 168 hours. Fasting Lipid Panel: No results for input(s): CHOL, HDL, LDLCALC, TRIG, CHOLHDL, LDLDIRECT in the last 168 hours. Thyroid Function Tests: No results for input(s): TSH, T4TOTAL, FREET4, T3FREE, THYROIDAB in the last 168 hours. Coagulation:  Recent Labs Lab 08/18/15 1824 08/19/15 0458 08/24/15 0515  LABPROT 14.8 15.7* 14.8  INR 1.15 1.23 1.14   Anemia Panel:  Recent Labs Lab 08/19/15 1905 08/20/15 0727  VITAMINB12  --  782  RETICCTPCT 0.7  --    Urine Drug Screen: Drugs of Abuse  No results found for: LABOPIA, COCAINSCRNUR,  LABBENZ, AMPHETMU, THCU, LABBARB  Alcohol Level: No results for input(s): ETH in the last 168 hours. Urinalysis:  Recent Labs Lab 08/18/15 1600  COLORURINE YELLOW  LABSPEC 1.015  PHURINE 6.0  GLUCOSEU NEGATIVE  HGBUR TRACE*  BILIRUBINUR NEGATIVE  KETONESUR 15*  PROTEINUR NEGATIVE  UROBILINOGEN 0.2  NITRITE NEGATIVE  LEUKOCYTESUR NEGATIVE   Misc. Labs:   Micro Results: Recent Results (from the past 240 hour(s))  CSF culture with Stat gram stain     Status: None   Collection Time: 08/20/15  4:55 PM  Result Value Ref Range Status   Specimen Description CSF  Final   Special Requests NONE  Final   Gram Stain   Final    WBC PRESENT, PREDOMINANTLY MONONUCLEAR NO ORGANISMS SEEN CYTOSPIN    Culture NO GROWTH 3 DAYS  Final   Report Status 08/23/2015 FINAL  Final   Studies/Results: No results found. Medications: I have reviewed the patient's current medications. Scheduled Meds: . sodium chloride   Intravenous Once  . allopurinol  100 mg Oral BID  . dexamethasone  4 mg Intravenous 4 times per day  . pantoprazole  40 mg Oral Daily  . polyethylene glycol  17 g Oral Daily  . senna-docusate  2 tablet Oral QHS  . Vitamin D (Ergocalciferol)  50,000 Units Oral Q7 days   Continuous Infusions:  PRN Meds:.iohexol, promethazine, traMADol Assessment/Plan: Principal Problem:   Non Hodgkin's lymphoma (HCC) Active Problems:   Thrombocytopenia (HCC)   Generalized weakness   Weight loss   Vitamin B12 deficiency   GERD (gastroesophageal reflux disease)   Hx of gastric ulcer   Hx of cervical cancer   Hypokalemia   Headache   At high risk of tumor lysis syndrome   Numbness of face   Burkitt lymphoma (HCC)   Constipation  Burkitt lymphoma  Flow cytometry c/w non-hodgkin's B cell lymphoma.  We will need further guidance regarding next steps from heme-onc.  She will likely have port placed on Monday and hopefully transferred to [facility] for further care soon.  Also PET scan ordered for  today.   -cont dexamethasone 4 mg IV q6hours -cont allopurinol   Dispo Disposition pending further plans per heme-onc.  The patient does have a current PCP (Dr. Cyndy Freeze, [place]) and does need an [facility] hospital follow-up appointment after discharge.    LOS: 4 days   Jones Bales, MD 08/24/2015, 10:13 AM

## 2015-08-25 DIAGNOSIS — C8370 Burkitt lymphoma, unspecified site: Secondary | ICD-10-CM

## 2015-08-25 LAB — ABO/RH: ABO/RH(D): O POS

## 2015-08-25 MED ORDER — SODIUM CHLORIDE 0.9 % IV SOLN
Freq: Once | INTRAVENOUS | Status: AC
  Administered 2015-08-25: 13:00:00 via INTRAVENOUS

## 2015-08-25 NOTE — Progress Notes (Signed)
[facility] PROGRESS NOTE    Progress Note   [name] ASN:[id number] DOB: [birth year] DOA: 08/18/2015 PCP: No primary care provider on file.   Brief Narrative:   Kristen Baker is an 81 y.o. female the presented to the hospital with abdominal pain headaches CT of the abdomen and pelvis revealed retroperitoneal lymphadenopathy, she's been complaining of weight loss for the past 2-3 months, unintentional. Also found to have thrombocytopenia and MRI showed leptomeningeal involvement. Oncology was consulted and recommended IR to proceed with a bone marrow biopsy and lymph node biopsy.  Assessment/Plan:  Burkitt lymphoma (HCC): - Core needle biopsy performed on 08/21/2015 with Flow cytometry was compatible with non-Hodgkin B-cell lymphoma. - She will likely need a Port-A-Cath placed on 08/26/2015, there has been a mild drop in her platelet count repeat a CBC in the morning and transfuse if less than 50. - PET scan is pending. - Continue allopurinol and IV dexamethasone, 4 tumor lysis syndrome prophylaxis, continue to monitor complete metabolic panel and uric acid along with phosphorus. - Oncology as evaluating the patient and recommended intrathecal mature track site for the first few doses. - Get a PT OT consult. - Oncology will  start EPOCH-R + IT methotrexate on monday10/17/2016 after port placement   Acute kidney injury: Resolved with IV hydration. Avoid NSAIDs.  DVT Prophylaxis: - Lovenox ordered.  Family Communication: none Disposition Plan: Home when stable. Code Status:     Code Status Orders        Start     Ordered   08/18/15 2058  Full code   Continuous     08/18/15 2057    Advance Directive Documentation        Most Recent Value   Type of Advance Directive  Living will   Pre-existing out of facility DNR order (yellow form or pink MOST form)     "MOST" Form in Place?          IV Access:    Peripheral IV   Procedures and diagnostic studies:     No results found.   Medical Consultants:    None.  Anti-Infectives:   Anti-infectives    None      Subjective:    Toleen Lachapelle feels weak.  Objective:    Filed Vitals:   08/24/15 0546 08/24/15 1543 08/24/15 2036 08/25/15 0452  BP: 141/55 118/54 120/44 138/58  Pulse: 57 65 60 58  Temp: 97.8 F (36.6 C) 97.7 F (36.5 C) 98 F (36.7 C) 98.2 F (36.8 C)  TempSrc: Oral Oral Oral Oral  Resp: 18 18 16 16   Height:      Weight: 79.833 kg (176 lb) 80.468 kg (177 lb 6.4 oz)    SpO2: 95% 97% 96% 96%    Intake/Output Summary (Last 24 hours) at 08/25/15 0834 Last data filed at 08/25/15 0337  Gross per 24 hour  Intake    720 ml  Output   1900 ml  Net  -1180 ml   Filed Weights   08/23/15 0640 08/24/15 0546 08/24/15 1543  Weight: 79.2 kg (174 lb 9.7 oz) 79.833 kg (176 lb) 80.468 kg (177 lb 6.4 oz)    Exam: Gen:  NAD Cardiovascular:  RRR, No M/R/G Chest and lungs:   CTAB Abdomen:  Abdomen soft, NT/ND, + BS Extremities:  No C/E/C   Data Reviewed:    Labs: Basic Metabolic Panel:  Recent Labs Lab 08/18/15 1408 08/18/15 2128 08/19/15 0458 08/21/15 0607 08/22/15 0815 08/22/15 1630 08/24/15 0515  NA 137  --  142 137 140  --  136  K 3.1*  --  3.7 4.0 4.1  --  4.6  CL 100*  --  105 98* 99*  --  98*  CO2 22  --  $R'25 26 27  'hc$ --  28  GLUCOSE 88  --  90 174* 168*  --  167*  BUN 13  --  10 15 25*  --  19  CREATININE 1.30*  --  1.14* 0.99 0.87  --  0.75  CALCIUM 9.0  --  9.0 8.5* 8.9  --  8.1*  MG  --  1.7  --   --   --   --   --   PHOS  --   --   --   --   --  4.0  --    GFR Estimated Creatinine Clearance: 61.5 mL/min (by C-G formula based on Cr of 0.75). Liver Function Tests:  Recent Labs Lab 08/18/15 1408 08/19/15 0458 08/24/15 0515  AST 69* 61* 27  ALT $Re'16 16 22  'jFl$ ALKPHOS 95 88 67  BILITOT 0.8 0.6 0.4  PROT 5.7* 5.4* 5.0*  ALBUMIN 3.2* 2.9* 3.0*    Recent Labs Lab 08/18/15 1408  LIPASE 23   No results for input(s): AMMONIA in the last  168 hours. Coagulation profile  Recent Labs Lab 08/18/15 1824 08/19/15 0458 08/24/15 0515  INR 1.15 1.23 1.14    CBC:  Recent Labs Lab 08/18/15 1408  08/19/15 0458 08/20/15 1630 08/21/15 0607 08/21/15 1032 08/22/15 0815 08/24/15 0515  WBC 7.0  --  7.9 6.7 7.9 8.2 6.2 2.9*  NEUTROABS 4.0  --  4.1  --  3.8  --   --  1.5*  HGB 12.6  --  11.2* 10.9* 11.8* 12.4 10.6* 10.0*  HCT 38.0  --  34.3* 33.8* 35.3* 36.4 32.2* 29.8*  MCV 87.4  --  88.2 88.0 87.6 86.1 86.3 87.1  PLT 34*  < > 61* 41* 35* 59* 47* 38*  < > = values in this interval not displayed. Cardiac Enzymes: No results for input(s): CKTOTAL, CKMB, CKMBINDEX, TROPONINI in the last 168 hours. BNP (last 3 results) No results for input(s): PROBNP in the last 8760 hours. CBG: No results for input(s): GLUCAP in the last 168 hours. D-Dimer: No results for input(s): DDIMER in the last 72 hours. Hgb A1c: No results for input(s): HGBA1C in the last 72 hours. Lipid Profile: No results for input(s): CHOL, HDL, LDLCALC, TRIG, CHOLHDL, LDLDIRECT in the last 72 hours. Thyroid function studies: No results for input(s): TSH, T4TOTAL, T3FREE, THYROIDAB in the last 72 hours.  Invalid input(s): FREET3 Anemia work up: No results for input(s): VITAMINB12, FOLATE, FERRITIN, TIBC, IRON, RETICCTPCT in the last 72 hours. Sepsis Labs:  Recent Labs Lab 08/21/15 0607 08/21/15 1032 08/22/15 0815 08/24/15 0515  WBC 7.9 8.2 6.2 2.9*   Microbiology Recent Results (from the past 240 hour(s))  CSF culture with Stat gram stain     Status: None   Collection Time: 08/20/15  4:55 PM  Result Value Ref Range Status   Specimen Description CSF  Final   Special Requests NONE  Final   Gram Stain   Final    WBC PRESENT, PREDOMINANTLY MONONUCLEAR NO ORGANISMS SEEN CYTOSPIN    Culture NO GROWTH 3 DAYS  Final   Report Status 08/23/2015 FINAL  Final     Medications:   . sodium chloride   Intravenous Once  . allopurinol  100 mg Oral BID    .  dexamethasone  4 mg Intravenous 4 times per day  . pantoprazole  40 mg Oral Daily  . polyethylene glycol  17 g Oral Daily  . senna-docusate  2 tablet Oral QHS  . Vitamin D (Ergocalciferol)  50,000 Units Oral Q7 days   Continuous Infusions:   Time spent: 25 min   LOS: 5 days   Charlynne Cousins  [facility] Pager [phone]  *Please refer to [email or web address], password [email or web address] to get updated schedule on who will round on this patient, as hospitalists switch teams weekly. If 7PM-7AM, please contact night-coverage at [email or web address], password [email or web address] for any overnight needs.  08/25/2015, 8:34 AM

## 2015-08-25 NOTE — Consult Note (Signed)
Chief Complaint: Patient was seen in consultation today for lymphoma--need for [facility] Chief Complaint  Patient presents with  . Generalized Body Aches  . Numbness   at the request of Dr Aileen Fass  Referring Physician(s): Dr Irene Limbo  History of Present Illness: Kristen Baker is a 81 y.o. female   Pt newly diagnosed Lymphoma Need for [facility] a cath for treatment Now scheduled for same plt 38 today; thrombocytopenia  Past Medical History  Diagnosis Date  . Cancer [facility])     cervical cancer    Past Surgical History  Procedure Laterality Date  . Cholecystectomy    . Tonsillectomy    . Abdominal hysterectomy    . Abdominal surgery      2-3 rd of colon removed    Allergies: Cabbage; Onion; Shellfish allergy; and Sulfa antibiotics  Medications: Prior to Admission medications   Medication Sig Start Date End Date Taking? Authorizing Provider  DimenhyDRINATE (MOTION SICKNESS PO) Take 1 tablet by mouth daily as needed. For motion sickness per patient   Yes Historical Provider, MD  naproxen sodium (ANAPROX) 220 MG tablet Take 220 mg by mouth daily as needed. For pain   Yes Historical Provider, MD  omeprazole (PRILOSEC) 40 MG capsule Take 40 mg by mouth daily.   Yes Historical Provider, MD  Vitamin D, Ergocalciferol, (DRISDOL) 50000 UNITS CAPS capsule Take 50,000 Units by mouth every 7 (seven) days. Take on Tuesdays   Yes Historical Provider, MD     History reviewed. No pertinent family history.  Social History   Social History  . Marital Status: Married    Spouse Name: N/A  . Number of Children: N/A  . Years of Education: N/A   Social History Main Topics  . Smoking status: Never Smoker   . Smokeless tobacco: None  . Alcohol Use: No  . Drug Use: No  . Sexual Activity: Not Asked   Other Topics Concern  . None   Social History Narrative     Review of Systems: A 12 point ROS discussed and pertinent positives are indicated in the HPI above.  All other systems are  negative.  Review of Systems  Constitutional: Positive for activity change, appetite change, fatigue and unexpected weight change.  Respiratory: Negative for shortness of breath.   Neurological: Positive for weakness.  Psychiatric/Behavioral: Negative for behavioral problems and confusion.    Vital Signs: BP 138/58 mmHg  Pulse 58  Temp(Src) 98.2 F (36.8 C) (Oral)  Resp 16  Ht 5' 7.2" (1.707 m)  Wt 177 lb 6.4 oz (80.468 kg)  BMI 27.62 kg/m2  SpO2 96%  Physical Exam  Cardiovascular: Normal rate and regular rhythm.   Pulmonary/Chest: Effort normal and breath sounds normal. She has no wheezes.  Abdominal: Soft. Bowel sounds are normal. There is no tenderness.  Musculoskeletal: Normal range of motion.  Neurological: She is alert.  Skin: Skin is warm.  Psychiatric: She has a normal mood and affect. Her behavior is normal. Judgment and thought content normal.  Nursing note and vitals reviewed.   Mallampati Score:  MD Evaluation Airway: WNL Heart: WNL Abdomen: WNL Chest/ Lungs: WNL ASA  Classification: 3 Mallampati/Airway Score: Two  Imaging: Ct Chest Wo Contrast  08/19/2015  CLINICAL DATA:  81 year old female with abnormal CT Abdomen and Pelvis, possible lymphoma. Initial encounter. EXAM: CT CHEST WITHOUT CONTRAST TECHNIQUE: Multidetector CT imaging of the chest was performed following the standard protocol without IV contrast. COMPARISON:  CT Abdomen and Pelvis 08/18/2015. Chest and abdominal radiographs 08/18/2015.  FINDINGS: Noncontrast exam. Stable abnormal inferior posterior mediastinum and retrocrural soft tissue as described recently. No pericardial effusion. No pleural effusion. Mediastinal and axillary lymph nodes are normal. Thoracic inlet lymph nodes are normal. Calcified aortic and coronary artery atherosclerosis. Major airways are patent. There are occasional calcified granulomas. There is right upper lobe bronchiectasis with mild patchy peribronchial opacity along  the major fissure (series 305, image 16). Mild tree-in-bud nodular opacity along the inferior lateral right upper lobe near the minor fissure (image 24). Minor dependent atelectasis. Degenerative changes in the thoracic spine. No acute or suspicious osseous lesion in the chest. IMPRESSION: 1. Abnormal posterior mediastinal/retrocrural soft tissue as seen on the recent CT Abdomen and Pelvis. Mediastinal, axillary, and thoracic inlet lymph nodes are normal. 2. Right upper lobe bronchiectasis and peripheral / peribronchial opacity compatible with distal airway infection or less likely scarring. Electronically Signed   By: Genevie Ann M.D.   On: 08/19/2015 23:39   [name] HF Contrast  08/19/2015  CLINICAL DATA:  Headache.  Possible lymphoma. EXAM: MRI HEAD WITHOUT AND WITH CONTRAST TECHNIQUE: Multiplanar, multiecho pulse sequences of the brain and surrounding structures were obtained without and with intravenous contrast. CONTRAST:  34mL MULTIHANCE GADOBENATE DIMEGLUMINE 529 MG/ML IV SOLN COMPARISON:  None. FINDINGS: Ventricle size normal. Cerebral volume normal. Pituitary normal in size. Craniocervical junction normal. Negative for acute infarct. Minimal hyperintensity in the periventricular white matter may represent chronic ischemia. Negative for hemorrhage or fluid collection. Negative for edema in the brain Postcontrast imaging reveals leptomeningeal enhancement diffusely and bilaterally. There is mild smooth enhancement of the meninges without nodularity. No enhancing mass lesion in the brain. IMPRESSION: Diffuse leptomeningeal enhancement. Given the history, this is concerning for lymphomatous involvement. Lumbar puncture with cytology recommended. Negative for acute infarct or mass lesion. Electronically Signed   By: Franchot Gallo M.D.   On: 08/19/2015 20:47   Ct Abdomen Pelvis W Contrast  08/18/2015  CLINICAL DATA:  81 year old female with lower abdominal pain nausea and vomiting for 1 month. Initial  encounter. EXAM: CT ABDOMEN AND PELVIS WITH CONTRAST TECHNIQUE: Multidetector CT imaging of the abdomen and pelvis was performed using the standard protocol following bolus administration of intravenous contrast. CONTRAST:  6mL OMNIPAQUE IOHEXOL 300 MG/ML SOLN, 17mL OMNIPAQUE IOHEXOL 300 MG/ML SOLN COMPARISON:  [facility] Chest CTA 11/20/2012. Acute abdominal series from today FINDINGS: Mild respiratory motion artifact at the lung bases. There are chronic surgical clips about the distal thoracic esophagus and gastroesophageal junction. New since 2014 in the posterior mediastinum and tracking toward the retrocrural space is abnormal confluent soft tissue anterior to the thoracic spine and inseparable from the medial wall of the descending thoracic aorta measuring 3 x 44 x 54 mm (AP by transverse by CC). This seems to be separate from both the aorta and esophagus. There is no anterior thoracic spine erosion. Calcified plaque along this segment of the aorta. No associated pericardial or pleural effusion. Mild bronchiectasis at both lung bases with no confluent pulmonary opacity. Degenerative changes in the spine. Chronic 10 mm lucent area in the left T12 vertebral body is stable and most resembled hemangioma in 2014. No acute or suspicious osseous lesion is identified however, there is subtle increased ventral epidural soft tissue seen in the sacrum -sagittal image 73. Mild to moderate nonspecific presacral stranding. No pelvic free fluid. Negative rectum with retained stool. Uterus surgically absent. Adnexa within normal limits. Numerous pelvic phleboliths. Negative urinary bladder. Redundant sigmoid colon. Proximal sigmoid and left colon diverticulosis  with no active inflammation identified. Negative transverse colon. Negative right colon. Ileocecal valve lipoma incidentally noted. Appendix diminutive or absent. Negative terminal ileum. No dilated or abnormal small bowel loops. Occasional surgical clips in  the greater omentum. Diminutive stomach. Duodenum within normal limits. Major arterial structures are patent in the abdomen and pelvis with fairly extensive calcified aortic atherosclerosis. Portal venous system appears to be patent. Retroperitoneal lymphadenopathy maximal at the lower lumbar spine level anterior to the IVC measuring up to 22 mm short axis. Numerous increased para renal and other retroperitoneal space nodes are individually up to 14 mm short axis. Mesenteric nodes in the abdomen and pelvis have a more normal appearance. No pelvic sidewall or inguinal lymphadenopathy identified. Solitary small nonspecific low-density area in the right hepatic lobe measuring 10 mm on series 2, image 20, favor benign. Gallbladder not identified and felt to be surgically absent. No splenomegaly or splenic lesion. Negative pancreas and adrenal glands. Bilateral renal enhancement and contrast excretion within normal limits. No abdominal free fluid. IMPRESSION: 1. Retroperitoneal and lower posterior mediastinal / retrocrural soft tissue masses most compatible with lymphadenopathy, and most suggestive of Lymphoma. Some of these might be amenable to CT-guided biopsy, uncertain. 2. Subtle increased sacral epidural soft tissue, but no destructive osseous lesion identified. Nonspecific presacral stranding. Metastatic disease to the spine not excluded. Electronically Signed   By: Genevie Ann M.D.   On: 08/18/2015 18:52   Ct Biopsy  08/21/2015  CLINICAL DATA:  Suspected lymphoma EXAM: CT-GUIDED BIOPSY BONE MARROW AND PARA-AORTIC LYMPH NODE. MEDICATIONS AND MEDICAL HISTORY: Versed 2 mg, Fentanyl 100 mcg. Additional Medications: None. ANESTHESIA/SEDATION: Moderate sedation time: Third minutes PROCEDURE: The procedure, risks, benefits, and alternatives were explained to the patient. Questions regarding the procedure were encouraged and answered. The patient understands and consents to the procedure. The back was prepped with  Betadine in a sterile fashion, and a sterile drape was applied covering the operative field. A sterile gown and sterile gloves were used for the procedure. Under CT guidance, an 11 gauge needle was inserted into the right iliac bone via posterior approach. Aspirates and a core were obtained Under CT guidance, an 17 gauge needle was inserted into the para caval lymph node via right posterior lateral approach. Four 18 gauge core biopsies were obtained. The guide needle was removed. Patient tolerated the procedure well without complication. Vital sign monitoring by nursing staff during the procedure will continue as patient is in the special procedures unit for post procedure observation. FINDINGS: The images document guide needle placement within the right iliac bone and pericaval lymph node. Post biopsy images demonstrate no hemorrhage. COMPLICATIONS: None IMPRESSION: Successful CT-guided bone marrow aspirate, bone marrow core, and pericaval lymph node core which was placed in saline for lymphoma flow analysis. Electronically Signed   By: Marybelle Killings M.D.   On: 08/21/2015 14:10   Ct Biopsy  08/21/2015  CLINICAL DATA:  Suspected lymphoma EXAM: CT-GUIDED BIOPSY BONE MARROW AND PARA-AORTIC LYMPH NODE. MEDICATIONS AND MEDICAL HISTORY: Versed 2 mg, Fentanyl 100 mcg. Additional Medications: None. ANESTHESIA/SEDATION: Moderate sedation time: Third minutes PROCEDURE: The procedure, risks, benefits, and alternatives were explained to the patient. Questions regarding the procedure were encouraged and answered. The patient understands and consents to the procedure. The back was prepped with Betadine in a sterile fashion, and a sterile drape was applied covering the operative field. A sterile gown and sterile gloves were used for the procedure. Under CT guidance, an 11 gauge needle was inserted into the right  iliac bone via posterior approach. Aspirates and a core were obtained Under CT guidance, an 17 gauge needle was  inserted into the para caval lymph node via right posterior lateral approach. Four 18 gauge core biopsies were obtained. The guide needle was removed. Patient tolerated the procedure well without complication. Vital sign monitoring by nursing staff during the procedure will continue as patient is in the special procedures unit for post procedure observation. FINDINGS: The images document guide needle placement within the right iliac bone and pericaval lymph node. Post biopsy images demonstrate no hemorrhage. COMPLICATIONS: None IMPRESSION: Successful CT-guided bone marrow aspirate, bone marrow core, and pericaval lymph node core which was placed in saline for lymphoma flow analysis. Electronically Signed   By: Marybelle Killings M.D.   On: 08/21/2015 14:10   Dg Abd Acute W/chest  08/18/2015  CLINICAL DATA:  Chest pain and abdominal pain. EXAM: DG ABDOMEN ACUTE W/ 1V CHEST COMPARISON:  Chest x-ray dated 12/12/2012 performed at [place]: Heart size and pulmonary vascularity are normal and the lungs are clear. Surgical clips at the gastroesophageal junction. Slight thoracolumbar scoliosis. No free air or free fluid in the abdomen. Bowel gas pattern is normal. Surgical clips and staples in the abdomen. Multiple phleboliths in the pelvis. No acute osseous abnormality. IMPRESSION: Negative abdominal radiographs.  No acute cardiopulmonary disease. Electronically Signed   By: Lorriane Shire M.D.   On: 08/18/2015 17:17    Labs:  CBC:  Recent Labs  08/21/15 0607 08/21/15 1032 08/22/15 0815 08/24/15 0515  WBC 7.9 8.2 6.2 2.9*  HGB 11.8* 12.4 10.6* 10.0*  HCT 35.3* 36.4 32.2* 29.8*  PLT 35* 59* 47* 38*    COAGS:  Recent Labs  08/18/15 1824 08/19/15 0458 08/24/15 0515  INR 1.15 1.23 1.14  APTT 33  --  25    BMP:  Recent Labs  08/19/15 0458 08/21/15 0607 08/22/15 0815 08/24/15 0515  NA 142 137 140 136  K 3.7 4.0 4.1 4.6  CL 105 98* 99* 98*  CO2 25 26 27 28   GLUCOSE 90 174*  168* 167*  BUN 10 15 25* 19  CALCIUM 9.0 8.5* 8.9 8.1*  CREATININE 1.14* 0.99 0.87 0.75  GFRNONAA 44* 52* >60 >60  GFRAA 51* >60 >60 >60    LIVER FUNCTION TESTS:  Recent Labs  08/18/15 1408 08/19/15 0458 08/24/15 0515  BILITOT 0.8 0.6 0.4  AST 69* 61* 27  ALT 16 16 22   ALKPHOS 95 88 67  PROT 5.7* 5.4* 5.0*  ALBUMIN 3.2* 2.9* 3.0*    TUMOR MARKERS: No results for input(s): AFPTM, CEA, CA199, CHROMGRNA in the last 8760 hours.  Assessment and Plan:  Thrombocytopenia Lymphoma Need for chemo treatment Scheduled for [facility] 10/17 in IR Risks and Benefits discussed with the patient including, but not limited to bleeding, infection, pneumothorax, or fibrin sheath development and need for additional procedures. All of the patient's questions were answered, patient is agreeable to proceed. Consent signed and in chart.   Thank you for this interesting consult.  I greatly enjoyed meeting Kristen Baker and look forward to participating in their care.  A copy of this report was sent to the requesting provider on this date.  Signed: Dyami Umbach A 08/25/2015, 10:08 AM   I spent a total of 40 Minutes    in face to face in clinical consultation, greater than 50% of which was counseling/coordinating care for [facility]

## 2015-08-26 ENCOUNTER — Inpatient Hospital Stay (HOSPITAL_COMMUNITY): Payer: Medicare Other

## 2015-08-26 ENCOUNTER — Other Ambulatory Visit: Payer: Self-pay | Admitting: Physician Assistant

## 2015-08-26 DIAGNOSIS — C8371 Burkitt lymphoma, lymph nodes of head, face, and neck: Secondary | ICD-10-CM

## 2015-08-26 DIAGNOSIS — D801 Nonfamilial hypogammaglobulinemia: Secondary | ICD-10-CM

## 2015-08-26 DIAGNOSIS — D6182 Myelophthisis: Secondary | ICD-10-CM

## 2015-08-26 DIAGNOSIS — K5900 Constipation, unspecified: Secondary | ICD-10-CM

## 2015-08-26 DIAGNOSIS — C8379 Burkitt lymphoma, extranodal and solid organ sites: Secondary | ICD-10-CM

## 2015-08-26 DIAGNOSIS — C8378 Burkitt lymphoma, lymph nodes of multiple sites: Secondary | ICD-10-CM | POA: Insufficient documentation

## 2015-08-26 DIAGNOSIS — D6959 Other secondary thrombocytopenia: Secondary | ICD-10-CM

## 2015-08-26 DIAGNOSIS — G96198 Other disorders of meninges, not elsewhere classified: Secondary | ICD-10-CM | POA: Insufficient documentation

## 2015-08-26 DIAGNOSIS — G039 Meningitis, unspecified: Secondary | ICD-10-CM

## 2015-08-26 DIAGNOSIS — Z5111 Encounter for antineoplastic chemotherapy: Secondary | ICD-10-CM

## 2015-08-26 LAB — COMPREHENSIVE METABOLIC PANEL
ALBUMIN: 3.1 g/dL — AB (ref 3.5–5.0)
ALK PHOS: 63 U/L (ref 38–126)
ALT: 28 U/L (ref 14–54)
AST: 19 U/L (ref 15–41)
Anion gap: 7 (ref 5–15)
BILIRUBIN TOTAL: 0.6 mg/dL (ref 0.3–1.2)
BUN: 25 mg/dL — AB (ref 6–20)
CALCIUM: 8.2 mg/dL — AB (ref 8.9–10.3)
CO2: 27 mmol/L (ref 22–32)
CREATININE: 0.74 mg/dL (ref 0.44–1.00)
Chloride: 101 mmol/L (ref 101–111)
GFR calc Af Amer: >60 mL/min (ref >60)
GLUCOSE: 158 mg/dL — AB (ref 65–99)
Potassium: 4.7 mmol/L (ref 3.5–5.1)
Sodium: 135 mmol/L (ref 135–145)
TOTAL PROTEIN: 5.2 g/dL — AB (ref 6.5–8.1)

## 2015-08-26 LAB — CSF CELL COUNT WITH DIFFERENTIAL
RBC COUNT CSF: 2 /mm3 — AB
Tube #: 4
WBC CSF: 1 /mm3 (ref 0–5)

## 2015-08-26 LAB — GRAM STAIN: GRAM STAIN: NONE SEEN

## 2015-08-26 LAB — PROTEIN, CSF: Total  Protein, CSF: 31 mg/dL (ref 15–45)

## 2015-08-26 LAB — CBC
HEMATOCRIT: 27.9 % — AB (ref 36.0–46.0)
HEMOGLOBIN: 9.2 g/dL — AB (ref 12.0–15.0)
MCH: 29 pg (ref 26.0–34.0)
MCHC: 33 g/dL (ref 30.0–36.0)
MCV: 88 fL (ref 78.0–100.0)
Platelets: 115 10*3/uL — ABNORMAL LOW (ref 150–400)
RBC: 3.17 MIL/uL — ABNORMAL LOW (ref 3.87–5.11)
RDW: 16.5 % — ABNORMAL HIGH (ref 11.5–15.5)
WBC: 2.4 10*3/uL — AB (ref 4.0–10.5)

## 2015-08-26 LAB — PHOSPHORUS: Phosphorus: 4.3 mg/dL (ref 2.5–4.6)

## 2015-08-26 LAB — PREPARE PLATELET PHERESIS: UNIT DIVISION: 0

## 2015-08-26 LAB — LACTATE DEHYDROGENASE: LDH: 566 U/L — AB (ref 98–192)

## 2015-08-26 LAB — URIC ACID: URIC ACID, SERUM: 3.9 mg/dL (ref 2.3–6.6)

## 2015-08-26 MED ORDER — LIDOCAINE HCL 1 % IJ SOLN
INTRAMUSCULAR | Status: AC
  Filled 2015-08-26: qty 20

## 2015-08-26 MED ORDER — LIDOCAINE-EPINEPHRINE 2 %-1:100000 IJ SOLN
INTRAMUSCULAR | Status: AC
  Filled 2015-08-26: qty 1

## 2015-08-26 MED ORDER — SODIUM CHLORIDE 0.9 % IJ SOLN: 3.0000 mL | INTRAMUSCULAR | Status: DC | PRN

## 2015-08-26 MED ORDER — HEPARIN SOD (PORK) LOCK FLUSH 100 UNIT/ML IV SOLN: 250.0000 [IU] | Freq: Once | INTRAVENOUS | Status: DC | PRN

## 2015-08-26 MED ORDER — MIDAZOLAM HCL 2 MG/2ML IJ SOLN
INTRAMUSCULAR | Status: AC | PRN
  Administered 2015-08-26: 1 mg via INTRAVENOUS
  Administered 2015-08-26: 0.5 mg via INTRAVENOUS
  Administered 2015-08-26: 1 mg via INTRAVENOUS
  Administered 2015-08-26: 0.5 mg via INTRAVENOUS

## 2015-08-26 MED ORDER — SODIUM CHLORIDE 0.9 % IJ SOLN
Freq: Once | INTRAMUSCULAR | Status: AC
  Administered 2015-08-26: 14:00:00 via INTRATHECAL
  Filled 2015-08-26 (×2): qty 0.48

## 2015-08-26 MED ORDER — SODIUM CHLORIDE 0.9 % IJ SOLN: 10.0000 mL | INTRAMUSCULAR | Status: DC | PRN

## 2015-08-26 MED ORDER — ALTEPLASE 2 MG IJ SOLR: 2.0000 mg | Freq: Once | INTRAMUSCULAR | Status: DC | PRN

## 2015-08-26 MED ORDER — MIDAZOLAM HCL 2 MG/2ML IJ SOLN
INTRAMUSCULAR | Status: AC
  Filled 2015-08-26: qty 6

## 2015-08-26 MED ORDER — COLD PACK MISC ONCOLOGY
1.0000 | Freq: Once | Status: DC | PRN
  Filled 2015-08-26: qty 1

## 2015-08-26 MED ORDER — HEPARIN SOD (PORK) LOCK FLUSH 100 UNIT/ML IV SOLN
500.0000 [IU] | Freq: Once | INTRAVENOUS | Status: DC | PRN
  Filled 2015-08-26: qty 5

## 2015-08-26 MED ORDER — FENTANYL CITRATE (PF) 100 MCG/2ML IJ SOLN
INTRAMUSCULAR | Status: AC
  Filled 2015-08-26: qty 4

## 2015-08-26 MED ORDER — FENTANYL CITRATE (PF) 100 MCG/2ML IJ SOLN
INTRAMUSCULAR | Status: AC | PRN
  Administered 2015-08-26: 50 ug via INTRAVENOUS

## 2015-08-26 MED ORDER — CEFAZOLIN SODIUM-DEXTROSE 2-3 GM-% IV SOLR
INTRAVENOUS | Status: AC
  Filled 2015-08-26: qty 50

## 2015-08-26 MED ORDER — DEXAMETHASONE SODIUM PHOSPHATE 4 MG/ML IJ SOLN
8.0000 mg | Freq: Once | INTRAMUSCULAR | Status: AC
  Administered 2015-08-26: 8 mg via INTRAVENOUS
  Filled 2015-08-26: qty 2

## 2015-08-26 MED ORDER — VINCRISTINE SULFATE CHEMO INJECTION 1 MG/ML
Freq: Once | INTRAVENOUS | Status: AC
  Administered 2015-08-26: 17:00:00 via INTRAVENOUS
  Filled 2015-08-26 (×2): qty 10

## 2015-08-26 MED ORDER — PALONOSETRON HCL INJECTION 0.25 MG/5ML
0.2500 mg | Freq: Once | INTRAVENOUS | Status: AC
  Administered 2015-08-26: 0.25 mg via INTRAVENOUS
  Filled 2015-08-26: qty 5

## 2015-08-26 MED ORDER — CEFAZOLIN SODIUM-DEXTROSE 2-3 GM-% IV SOLR
2.0000 g | Freq: Once | INTRAVENOUS | Status: AC
  Administered 2015-08-26: 2 g via INTRAVENOUS

## 2015-08-26 MED ORDER — SODIUM CHLORIDE 0.9 % IV SOLN
INTRAVENOUS | Status: DC
  Administered 2015-08-28: 11:00:00 via INTRAVENOUS

## 2015-08-26 MED ORDER — HOT PACK MISC ONCOLOGY
1.0000 | Freq: Once | Status: DC | PRN
  Filled 2015-08-26: qty 1

## 2015-08-26 MED ORDER — SODIUM CHLORIDE 0.9 % IV SOLN
Freq: Once | INTRAVENOUS | Status: DC
  Filled 2015-08-26: qty 4

## 2015-08-26 MED ORDER — HEPARIN SOD (PORK) LOCK FLUSH 100 UNIT/ML IV SOLN
INTRAVENOUS | Status: AC
  Filled 2015-08-26: qty 5

## 2015-08-26 NOTE — Procedures (Signed)
Successful RT IJ POWER PORT INSERTION No comp Stable Tip svc/ra Ready for use

## 2015-08-26 NOTE — Progress Notes (Signed)
[facility] PROGRESS NOTE    Progress Note   Kristen Baker ZOX:[id number] DOB: 06-26-1933 DOA: 08/18/2015 PCP: No primary care provider on file.   Brief Narrative:   Kristen Baker is an 81 y.o. female the presented to the hospital with abdominal pain headaches CT of the abdomen and pelvis revealed retroperitoneal lymphadenopathy, she's been complaining of weight loss for the past 2-3 months, unintentional. Also found to have thrombocytopenia and MRI showed leptomeningeal involvement. Oncology was consulted and recommended IR to proceed with a bone marrow biopsy and lymph node biopsy.  Assessment/Plan:  Burkitt lymphoma ([place]): - Core needle biopsy performed on 08/21/2015 with Flow cytometry was compatible with non-Hodgkin B-cell lymphoma. - She will likely need a Port-A-Cath placed on 08/26/2015, platelets are 1:15. - PET scan is pending. LDH is mildly improved. - Continue allopurinol and IV dexamethasone, 4 tumor lysis syndrome prophylaxis, electrolytes are stable check a phosphorus level. - Oncology will  start EPOCH-R + IT methotrexate on monday10/17/2016 after port placement  - Get a PT OT consult.  Acute kidney injury: Resolved with IV hydration. Avoid NSAIDs.  Pancytopenia: Due to Burkitt's lymphoma continue to monitor. She is status post 1 unit of platelets.  DVT Prophylaxis: - Lovenox ordered.  Family Communication: none Disposition Plan: Home when stable. Code Status:     Code Status Orders        Start     Ordered   08/18/15 2058  Full code   Continuous     08/18/15 2057    Advance Directive Documentation        Most Recent Value   Type of Advance Directive  Living will   Pre-existing out of facility DNR order (yellow form or pink MOST form)     "MOST" Form in Place?          IV Access:    Peripheral IV   Procedures and diagnostic studies:   No results found.   Medical Consultants:    None.  Anti-Infectives:   Anti-infectives      None      Subjective:    Kristen Baker feels weak. Eager to start.  Objective:    Filed Vitals:   08/25/15 1432 08/25/15 1434 08/25/15 2007 08/26/15 0423  BP: 137/60 139/58 133/48 142/52  Pulse: 83 85 66 61  Temp: 98 F (36.7 C) 97.8 F (36.6 C) 97.9 F (36.6 C) 97.3 F (36.3 C)  TempSrc: Oral Oral Oral Oral  Resp: _0 Height:      Weight:    79.516 kg (175 lb 4.8 oz)  SpO2: 95% 95% 96% 96%    Intake/Output Summary (Last 24 hours) at 08/26/15 0759 Last data filed at 08/26/15 0424  Gross per 24 hour  Intake  802.8 ml  Output    725 ml  Net   77.8 ml   Filed Weights   08/24/15 0546 08/24/15 1543 08/26/15 0423  Weight: 79.833 kg (176 lb) 80.468 kg (177 lb 6.4 oz) 79.516 kg (175 lb 4.8 oz)    Exam: Gen:  NAD Cardiovascular:  RRR, No M/R/G Chest and lungs:   CTAB Abdomen:  Abdomen soft, NT/ND, + BS Extremities:  No C/E/C   Data Reviewed:    Labs: Basic Metabolic Panel:  Recent Labs Lab 08/21/15 0607 08/22/15 0815 08/22/15 1630 08/24/15 0515 08/26/15 0426  NA 137 140  --  136 135  K 4.0 4.1  --  4.6 4.7  CL 98* 99*  --  98* 101  CO2 26 27  --  28 27  GLUCOSE 174* 168*  --  167* 158*  BUN 15 25*  --  19 25*  CREATININE 0.99 0.87  --  0.75 0.74  CALCIUM 8.5* 8.9  --  8.1* 8.2*  PHOS  --   --  4.0  --   --    GFR Estimated Creatinine Clearance: 61.2 mL/min (by C-G formula based on Cr of 0.74). Liver Function Tests:  Recent Labs Lab 08/24/15 0515 08/26/15 0426  AST 27 19  ALT 22 28  ALKPHOS 67 63  BILITOT 0.4 0.6  PROT 5.0* 5.2*  ALBUMIN 3.0* 3.1*   No results for input(s): LIPASE, AMYLASE in the last 168 hours. No results for input(s): AMMONIA in the last 168 hours. Coagulation profile  Recent Labs Lab 08/24/15 0515  INR 1.14    CBC:  Recent Labs Lab 08/21/15 0607 08/21/15 1032 08/22/15 0815 08/24/15 0515 08/26/15 0426  WBC 7.9 8.2 6.2 2.9* 2.4*  NEUTROABS 3.8  --   --  1.5*  --   HGB 11.8* 12.4 10.6* 10.0*  9.2*  HCT 35.3* 36.4 32.2* 29.8* 27.9*  MCV 87.6 86.1 86.3 87.1 88.0  PLT 35* 59* 47* 38* 115*   Cardiac Enzymes: No results for input(s): CKTOTAL, CKMB, CKMBINDEX, TROPONINI in the last 168 hours. BNP (last 3 results) No results for input(s): PROBNP in the last 8760 hours. CBG: No results for input(s): GLUCAP in the last 168 hours. D-Dimer: No results for input(s): DDIMER in the last 72 hours. Hgb A1c: No results for input(s): HGBA1C in the last 72 hours. Lipid Profile: No results for input(s): CHOL, HDL, LDLCALC, TRIG, CHOLHDL, LDLDIRECT in the last 72 hours. Thyroid function studies: No results for input(s): TSH, T4TOTAL, T3FREE, THYROIDAB in the last 72 hours.  Invalid input(s): FREET3 Anemia work up: No results for input(s): VITAMINB12, FOLATE, FERRITIN, TIBC, IRON, RETICCTPCT in the last 72 hours. Sepsis Labs:  Recent Labs Lab 08/21/15 1032 08/22/15 0815 08/24/15 0515 08/26/15 0426  WBC 8.2 6.2 2.9* 2.4*   Microbiology Recent Results (from the past 240 hour(s))  CSF culture with Stat gram stain     Status: None   Collection Time: 08/20/15  4:55 PM  Result Value Ref Range Status   Specimen Description CSF  Final   Special Requests NONE  Final   Gram Stain   Final    WBC PRESENT, PREDOMINANTLY MONONUCLEAR NO ORGANISMS SEEN CYTOSPIN    Culture NO GROWTH 3 DAYS  Final   Report Status 08/23/2015 FINAL  Final     Medications:   . sodium chloride   Intravenous Once  . allopurinol  100 mg Oral BID  . dexamethasone  4 mg Intravenous 4 times per day  . pantoprazole  40 mg Oral Daily  . polyethylene glycol  17 g Oral Daily  . senna-docusate  2 tablet Oral QHS  . Vitamin D (Ergocalciferol)  50,000 Units Oral Q7 days   Continuous Infusions:   Time spent: 25 min   LOS: 6 days   Charlynne Cousins  [facility] Pager [phone]  *Please refer to [email or web address], password [email or web address] to get updated schedule on who will round on this patient, as hospitalists switch  teams weekly. If 7PM-7AM, please contact night-coverage at [email or web address], password [email or web address] for any overnight needs.  08/26/2015, 7:59 AM

## 2015-08-26 NOTE — Care Management Important Message (Signed)
Important Message  Patient Details  Name: Lanyia Jewel MRN: [id number] Date of Birth: 1933-10-09   Medicare Important Message Given:  Yes-third notification given    [name] 08/26/2015, 11:49 AMImportant Message  Patient Details  Name: Sharren Schnurr MRN: [id number] Date of Birth: 1933-02-27   Medicare Important Message Given:  Yes-third notification given    [name] 08/26/2015, 11:49 AM

## 2015-08-26 NOTE — Progress Notes (Signed)
Kristen Baker   HEMATOLOGY/ONCOLOGY INPATIENT PROGRESS NOTE  Date of Service: .08/26/2015   Inpatient Attending: .Charlynne Cousins, MD  SUBJECTIVE  Met with Kristen Baker and the presence of her son around noon today. She notes she is feeling well. We discussed that EPOCH-R + IT Methotrexate regimen in details and signed informed consent was obtained. Patient had her port placed uneventfully this morning. Her platelet counts have bound significantly with her platelet transfusion. She will be getting her lumbar puncture and intrathecal methotrexate dose 1 today and then start her EPOCH-R. Regimen. Discussed with pharmacy this morning about dose adjustments to her first cycle of chemotherapy. No fevers or chills. No issues with bleeding. No other acute symptoms.  OBJECTIVE:  Resting in bed  PHYSICAL EXAMINATION: . Filed Vitals:   08/26/15 1047 08/26/15 1114 08/26/15 1157 08/26/15 1225  BP: 120/54 136/42 116/48 131/58  Pulse: 64 72 64   Temp:  97.4 F (36.3 C) 97.3 F (36.3 C) 97.7 F (36.5 C)  TempSrc:  Oral Oral Oral  Resp: $Remo'13 18 18 16  'ufzaK$ Height:      Weight:      SpO2: 98% 94% 98% 96%   Filed Weights   08/24/15 0546 08/24/15 1543 08/26/15 0423  Weight: 176 lb (79.833 kg) 177 lb 6.4 oz (80.468 kg) 175 lb 4.8 oz (79.516 kg)   .Body mass index is 27.29 kg/(m^2).  GENERAL:alert, in no acute distress and comfortable SKIN: skin color, texture, turgor are normal, no rashes or significant lesions EYES: normal, conjunctiva are pink and non-injected, sclera clear OROPHARYNX:no exudate, no erythema and lips, buccal mucosa, and tongue normal  NECK: supple, no JVD, thyroid normal size, non-tender, without nodularity LYMPH: no palpable lymphadenopathy in the cervical, axillary or inguinal LUNGS: clear to auscultation with normal respiratory effort HEART: regular rate & rhythm, no murmurs and no lower extremity edema ABDOMEN: abdomen soft, non-tender, normoactive bowel sounds   Musculoskeletal: no cyanosis of digits and no clubbing  PSYCH: alert & oriented x 3 with fluent speech NEURO: no focal motor/sensory deficits.   MEDICAL HISTORY:  Past Medical History  Diagnosis Date  . Cancer [facility])     cervical cancer    SURGICAL HISTORY: Past Surgical History  Procedure Laterality Date  . Cholecystectomy    . Tonsillectomy    . Abdominal hysterectomy    . Abdominal surgery      2-3 rd of colon removed    SOCIAL HISTORY: Social History   Social History  . Marital Status: Married    Spouse Name: N/A  . Number of Children: N/A  . Years of Education: N/A   Occupational History  . Not on file.   Social History Main Topics  . Smoking status: Never Smoker   . Smokeless tobacco: Not on file  . Alcohol Use: No  . Drug Use: No  . Sexual Activity: Not on file   Other Topics Concern  . Not on file   Social History Narrative    FAMILY HISTORY: History reviewed. No pertinent family history.  ALLERGIES:  is allergic to cabbage; onion; shellfish allergy; and sulfa antibiotics.  MEDICATIONS:  Scheduled Meds: . sodium chloride   Intravenous Once  . allopurinol  100 mg Oral BID  . dexamethasone  4 mg Intravenous 4 times per day  . DOXOrubicin/vinCRIStine/etoposide CHEMO IV infusion for Inpatient CI   Intravenous Once  . lidocaine      . lidocaine-EPINEPHrine      . methotrexate INTRATHECAL (+/- HYDROCORTISONE,Ara-C)   Intrathecal  Once  . ondansetron (ZOFRAN) with dexamethasone (DECADRON) IV   Intravenous Once  . pantoprazole  40 mg Oral Daily  . polyethylene glycol  17 g Oral Daily  . senna-docusate  2 tablet Oral QHS  . Vitamin D (Ergocalciferol)  50,000 Units Oral Q7 days   Continuous Infusions: . sodium chloride     PRN Meds:.alteplase, Cold Pack, heparin lock flush, heparin lock flush, Hot Pack, iohexol, promethazine, sodium chloride, sodium chloride, traMADol  REVIEW OF SYSTEMS:    10 Point review of Systems was done is negative  except as noted above.   LABORATORY DATA:  I have reviewed the data as listed  . CBC Latest Ref Rng 08/26/2015 08/24/2015 08/22/2015  WBC 4.0 - 10.5 K/uL 2.4(L) 2.9(L) 6.2  Hemoglobin 12.0 - 15.0 g/dL 9.2(L) 10.0(L) 10.6(L)  Hematocrit 36.0 - 46.0 % 27.9(L) 29.8(L) 32.2(L)  Platelets 150 - 400 K/uL 115(L) 38(L) 47(L)    . CMP Latest Ref Rng 08/26/2015 08/24/2015 08/22/2015  Glucose 65 - 99 mg/dL 158(H) 167(H) 168(H)  BUN 6 - 20 mg/dL 25(H) 19 25(H)  Creatinine 0.44 - 1.00 mg/dL 0.74 0.75 0.87  Sodium 135 - 145 mmol/L 135 136 140  Potassium 3.5 - 5.1 mmol/L 4.7 4.6 4.1  Chloride 101 - 111 mmol/L 101 98(L) 99(L)  CO2 22 - 32 mmol/L $RemoveB'27 28 27  'yUVEcInK$ Calcium 8.9 - 10.3 mg/dL 8.2(L) 8.1(L) 8.9  Total Protein 6.5 - 8.1 g/dL 5.2(L) 5.0(L) -  Total Bilirubin 0.3 - 1.2 mg/dL 0.6 0.4 -  Alkaline Phos 38 - 126 U/L 63 67 -  AST 15 - 41 U/L 19 27 -  ALT 14 - 54 U/L 28 22 -   . Lab Results  Component Value Date   LDH 1215* 08/24/2015     RADIOGRAPHIC STUDIES: I have personally reviewed the radiological images as listed and agreed with the findings in the report. Ct Chest Wo Contrast  08/19/2015  CLINICAL DATA:  81 year old female with abnormal CT Abdomen and Pelvis, possible lymphoma. Initial encounter. EXAM: CT CHEST WITHOUT CONTRAST TECHNIQUE: Multidetector CT imaging of the chest was performed following the standard protocol without IV contrast. COMPARISON:  CT Abdomen and Pelvis 08/18/2015. Chest and abdominal radiographs 08/18/2015. FINDINGS: Noncontrast exam. Stable abnormal inferior posterior mediastinum and retrocrural soft tissue as described recently. No pericardial effusion. No pleural effusion. Mediastinal and axillary lymph nodes are normal. Thoracic inlet lymph nodes are normal. Calcified aortic and coronary artery atherosclerosis. Major airways are patent. There are occasional calcified granulomas. There is right upper lobe bronchiectasis with mild patchy peribronchial opacity  along the major fissure (series 305, image 16). Mild tree-in-bud nodular opacity along the inferior lateral right upper lobe near the minor fissure (image 24). Minor dependent atelectasis. Degenerative changes in the thoracic spine. No acute or suspicious osseous lesion in the chest. IMPRESSION: 1. Abnormal posterior mediastinal/retrocrural soft tissue as seen on the recent CT Abdomen and Pelvis. Mediastinal, axillary, and thoracic inlet lymph nodes are normal. 2. Right upper lobe bronchiectasis and peripheral / peribronchial opacity compatible with distal airway infection or less likely scarring. Electronically Signed   By: Genevie Ann M.D.   On: 08/19/2015 23:39   [name] UU Contrast  08/19/2015  CLINICAL DATA:  Headache.  Possible lymphoma. EXAM: MRI HEAD WITHOUT AND WITH CONTRAST TECHNIQUE: Multiplanar, multiecho pulse sequences of the brain and surrounding structures were obtained without and with intravenous contrast. CONTRAST:  59mL MULTIHANCE GADOBENATE DIMEGLUMINE 529 MG/ML IV SOLN COMPARISON:  None. FINDINGS: Ventricle size  normal. Cerebral volume normal. Pituitary normal in size. Craniocervical junction normal. Negative for acute infarct. Minimal hyperintensity in the periventricular white matter may represent chronic ischemia. Negative for hemorrhage or fluid collection. Negative for edema in the brain Postcontrast imaging reveals leptomeningeal enhancement diffusely and bilaterally. There is mild smooth enhancement of the meninges without nodularity. No enhancing mass lesion in the brain. IMPRESSION: Diffuse leptomeningeal enhancement. Given the history, this is concerning for lymphomatous involvement. Lumbar puncture with cytology recommended. Negative for acute infarct or mass lesion. Electronically Signed   By: Franchot Gallo M.D.   On: 08/19/2015 20:47   Ct Abdomen Pelvis W Contrast  08/18/2015  CLINICAL DATA:  81 year old female with lower abdominal pain nausea and vomiting for 1 month.  Initial encounter. EXAM: CT ABDOMEN AND PELVIS WITH CONTRAST TECHNIQUE: Multidetector CT imaging of the abdomen and pelvis was performed using the standard protocol following bolus administration of intravenous contrast. CONTRAST:  21mL OMNIPAQUE IOHEXOL 300 MG/ML SOLN, 129mL OMNIPAQUE IOHEXOL 300 MG/ML SOLN COMPARISON:  [facility] Chest CTA 11/20/2012. Acute abdominal series from today FINDINGS: Mild respiratory motion artifact at the lung bases. There are chronic surgical clips about the distal thoracic esophagus and gastroesophageal junction. New since 2014 in the posterior mediastinum and tracking toward the retrocrural space is abnormal confluent soft tissue anterior to the thoracic spine and inseparable from the medial wall of the descending thoracic aorta measuring 3 x 44 x 54 mm (AP by transverse by CC). This seems to be separate from both the aorta and esophagus. There is no anterior thoracic spine erosion. Calcified plaque along this segment of the aorta. No associated pericardial or pleural effusion. Mild bronchiectasis at both lung bases with no confluent pulmonary opacity. Degenerative changes in the spine. Chronic 10 mm lucent area in the left T12 vertebral body is stable and most resembled hemangioma in 2014. No acute or suspicious osseous lesion is identified however, there is subtle increased ventral epidural soft tissue seen in the sacrum -sagittal image 73. Mild to moderate nonspecific presacral stranding. No pelvic free fluid. Negative rectum with retained stool. Uterus surgically absent. Adnexa within normal limits. Numerous pelvic phleboliths. Negative urinary bladder. Redundant sigmoid colon. Proximal sigmoid and left colon diverticulosis with no active inflammation identified. Negative transverse colon. Negative right colon. Ileocecal valve lipoma incidentally noted. Appendix diminutive or absent. Negative terminal ileum. No dilated or abnormal small bowel loops. Occasional surgical  clips in the greater omentum. Diminutive stomach. Duodenum within normal limits. Major arterial structures are patent in the abdomen and pelvis with fairly extensive calcified aortic atherosclerosis. Portal venous system appears to be patent. Retroperitoneal lymphadenopathy maximal at the lower lumbar spine level anterior to the IVC measuring up to 22 mm short axis. Numerous increased para renal and other retroperitoneal space nodes are individually up to 14 mm short axis. Mesenteric nodes in the abdomen and pelvis have a more normal appearance. No pelvic sidewall or inguinal lymphadenopathy identified. Solitary small nonspecific low-density area in the right hepatic lobe measuring 10 mm on series 2, image 20, favor benign. Gallbladder not identified and felt to be surgically absent. No splenomegaly or splenic lesion. Negative pancreas and adrenal glands. Bilateral renal enhancement and contrast excretion within normal limits. No abdominal free fluid. IMPRESSION: 1. Retroperitoneal and lower posterior mediastinal / retrocrural soft tissue masses most compatible with lymphadenopathy, and most suggestive of Lymphoma. Some of these might be amenable to CT-guided biopsy, uncertain. 2. Subtle increased sacral epidural soft tissue, but no destructive osseous lesion identified. Nonspecific  presacral stranding. Metastatic disease to the spine not excluded. Electronically Signed   By: Genevie Ann M.D.   On: 08/18/2015 18:52   Ir Fluoro Guide Cv Line Right  08/26/2015  CLINICAL DATA:  ACCESS FOR CHEMOTHERAPY, BURKITT'S LYMPHOMA EXAM: RIGHT INTERNAL JUGULAR SINGLE LUMEN POWER PORT CATHETER INSERTION Date:  10/17/201610/17/2016 11:08 am Radiologist:  M. Daryll Brod, MD Guidance:  Ultrasound and fluoroscopic FLUOROSCOPY TIME:  30 seconds, 3 mGy MEDICATIONS AND MEDICAL HISTORY: 2 g Ancefadministered within 1 hour of the procedure.3 mg Versed, 50 mcg fentanyl ANESTHESIA/SEDATION: 30 minutes CONTRAST:  None. COMPLICATIONS: None  immediate PROCEDURE: Informed consent was obtained from the patient following explanation of the procedure, risks, benefits and alternatives. The patient understands, agrees and consents for the procedure. All questions were addressed. A time out was performed. Maximal barrier sterile technique utilized including caps, mask, sterile gowns, sterile gloves, large sterile drape, hand hygiene, and 2% chlorhexidine scrub. Under sterile conditions and local anesthesia, right internal jugular micropuncture venous access was performed. Access was performed with ultrasound. Images were obtained for documentation. A guide wire was inserted followed by a transitional dilator. This allowed insertion of a guide wire and catheter into the IVC. Measurements were obtained from the SVC / RA junction back to the right IJ venotomy site. In the right infraclavicular chest, a subcutaneous pocket was created over the second anterior rib. This was done under sterile conditions and local anesthesia. 1% lidocaine with epinephrine was utilized for this. A 2.5 cm incision was made in the skin. Blunt dissection was performed to create a subcutaneous pocket over the right pectoralis major muscle. The pocket was flushed with saline vigorously. There was adequate hemostasis. The port catheter was assembled and checked for leakage. The port catheter was secured in the pocket with two retention sutures. The tubing was tunneled subcutaneously to the right venotomy site and inserted into the SVC/RA junction through a valved peel-away sheath. Position was confirmed with fluoroscopy. Images were obtained for documentation. The patient tolerated the procedure well. No immediate complications. Incisions were closed in a two layer fashion with 4 - 0 Vicryl suture. Dermabond was applied to the skin. The port catheter was accessed, blood was aspirated followed by saline and heparin flushes. Needle was removed. A dry sterile dressing was applied. IMPRESSION:  Ultrasound and fluoroscopically guided right internal jugular single lumen power port catheter insertion. Tip in the SVC/RA junction. Catheter ready for use. Electronically Signed   By: Jerilynn Mages.  Shick M.D.   On: 08/26/2015 11:44   Ir US Guide Vasc Access Right  08/26/2015  CLINICAL DATA:  ACCESS FOR CHEMOTHERAPY, BURKITT'S LYMPHOMA EXAM: RIGHT INTERNAL JUGULAR SINGLE LUMEN POWER PORT CATHETER INSERTION Date:  10/17/201610/17/2016 11:08 am Radiologist:  M. Daryll Brod, MD Guidance:  Ultrasound and fluoroscopic FLUOROSCOPY TIME:  30 seconds, 3 mGy MEDICATIONS AND MEDICAL HISTORY: 2 g Ancefadministered within 1 hour of the procedure.3 mg Versed, 50 mcg fentanyl ANESTHESIA/SEDATION: 30 minutes CONTRAST:  None. COMPLICATIONS: None immediate PROCEDURE: Informed consent was obtained from the patient following explanation of the procedure, risks, benefits and alternatives. The patient understands, agrees and consents for the procedure. All questions were addressed. A time out was performed. Maximal barrier sterile technique utilized including caps, mask, sterile gowns, sterile gloves, large sterile drape, hand hygiene, and 2% chlorhexidine scrub. Under sterile conditions and local anesthesia, right internal jugular micropuncture venous access was performed. Access was performed with ultrasound. Images were obtained for documentation. A guide wire was inserted followed by a transitional dilator. This  allowed insertion of a guide wire and catheter into the IVC. Measurements were obtained from the SVC / RA junction back to the right IJ venotomy site. In the right infraclavicular chest, a subcutaneous pocket was created over the second anterior rib. This was done under sterile conditions and local anesthesia. 1% lidocaine with epinephrine was utilized for this. A 2.5 cm incision was made in the skin. Blunt dissection was performed to create a subcutaneous pocket over the right pectoralis major muscle. The pocket was flushed  with saline vigorously. There was adequate hemostasis. The port catheter was assembled and checked for leakage. The port catheter was secured in the pocket with two retention sutures. The tubing was tunneled subcutaneously to the right venotomy site and inserted into the SVC/RA junction through a valved peel-away sheath. Position was confirmed with fluoroscopy. Images were obtained for documentation. The patient tolerated the procedure well. No immediate complications. Incisions were closed in a two layer fashion with 4 - 0 Vicryl suture. Dermabond was applied to the skin. The port catheter was accessed, blood was aspirated followed by saline and heparin flushes. Needle was removed. A dry sterile dressing was applied. IMPRESSION: Ultrasound and fluoroscopically guided right internal jugular single lumen power port catheter insertion. Tip in the SVC/RA junction. Catheter ready for use. Electronically Signed   By: Jerilynn Mages.  Shick M.D.   On: 08/26/2015 11:44   Ct Biopsy  08/21/2015  CLINICAL DATA:  Suspected lymphoma EXAM: CT-GUIDED BIOPSY BONE MARROW AND PARA-AORTIC LYMPH NODE. MEDICATIONS AND MEDICAL HISTORY: Versed 2 mg, Fentanyl 100 mcg. Additional Medications: None. ANESTHESIA/SEDATION: Moderate sedation time: Third minutes PROCEDURE: The procedure, risks, benefits, and alternatives were explained to the patient. Questions regarding the procedure were encouraged and answered. The patient understands and consents to the procedure. The back was prepped with Betadine in a sterile fashion, and a sterile drape was applied covering the operative field. A sterile gown and sterile gloves were used for the procedure. Under CT guidance, an 11 gauge needle was inserted into the right iliac bone via posterior approach. Aspirates and a core were obtained Under CT guidance, an 17 gauge needle was inserted into the para caval lymph node via right posterior lateral approach. Four 18 gauge core biopsies were obtained. The guide  needle was removed. Patient tolerated the procedure well without complication. Vital sign monitoring by nursing staff during the procedure will continue as patient is in the special procedures unit for post procedure observation. FINDINGS: The images document guide needle placement within the right iliac bone and pericaval lymph node. Post biopsy images demonstrate no hemorrhage. COMPLICATIONS: None IMPRESSION: Successful CT-guided bone marrow aspirate, bone marrow core, and pericaval lymph node core which was placed in saline for lymphoma flow analysis. Electronically Signed   By: Marybelle Killings M.D.   On: 08/21/2015 14:10   Ct Biopsy  08/21/2015  CLINICAL DATA:  Suspected lymphoma EXAM: CT-GUIDED BIOPSY BONE MARROW AND PARA-AORTIC LYMPH NODE. MEDICATIONS AND MEDICAL HISTORY: Versed 2 mg, Fentanyl 100 mcg. Additional Medications: None. ANESTHESIA/SEDATION: Moderate sedation time: Third minutes PROCEDURE: The procedure, risks, benefits, and alternatives were explained to the patient. Questions regarding the procedure were encouraged and answered. The patient understands and consents to the procedure. The back was prepped with Betadine in a sterile fashion, and a sterile drape was applied covering the operative field. A sterile gown and sterile gloves were used for the procedure. Under CT guidance, an 11 gauge needle was inserted into the right iliac bone via posterior approach. Aspirates and  a core were obtained Under CT guidance, an 17 gauge needle was inserted into the para caval lymph node via right posterior lateral approach. Four 18 gauge core biopsies were obtained. The guide needle was removed. Patient tolerated the procedure well without complication. Vital sign monitoring by nursing staff during the procedure will continue as patient is in the special procedures unit for post procedure observation. FINDINGS: The images document guide needle placement within the right iliac bone and pericaval lymph node.  Post biopsy images demonstrate no hemorrhage. COMPLICATIONS: None IMPRESSION: Successful CT-guided bone marrow aspirate, bone marrow core, and pericaval lymph node core which was placed in saline for lymphoma flow analysis. Electronically Signed   By: Marybelle Killings M.D.   On: 08/21/2015 14:10   Dg Abd Acute W/chest  08/18/2015  CLINICAL DATA:  Chest pain and abdominal pain. EXAM: DG ABDOMEN ACUTE W/ 1V CHEST COMPARISON:  Chest x-ray dated 12/12/2012 performed at [place]: Heart size and pulmonary vascularity are normal and the lungs are clear. Surgical clips at the gastroesophageal junction. Slight thoracolumbar scoliosis. No free air or free fluid in the abdomen. Bowel gas pattern is normal. Surgical clips and staples in the abdomen. Multiple phleboliths in the pelvis. No acute osseous abnormality. IMPRESSION: Negative abdominal radiographs.  No acute cardiopulmonary disease. Electronically Signed   By: Lorriane Shire M.D.   On: 08/18/2015 17:17   ECHO 08/23/2015:  Study Conclusions  - Left ventricle: The cavity size was normal. There was moderate concentric hypertrophy. Systolic function was vigorous. The estimated ejection fraction was in the range of 65% to 70%. Wall motion was normal; there were no regional wall motion abnormalities. Doppler parameters are consistent with abnormal left ventricular relaxation (grade 1 diastolic dysfunction). Doppler parameters are consistent with elevated ventricular end-diastolic filling pressure. - Aortic root: The aortic root was normal in size. - Mitral valve: There was no regurgitation. - Left atrium: The atrium was normal in size. - Right ventricle: Systolic function was normal. - Right atrium: The atrium was normal in size. - Tricuspid valve: There was no regurgitation. - Pulmonic valve: There was no regurgitation. - Pulmonary arteries: Systolic pressure was within the normal range. - Inferior vena cava: The  vessel was normal in size. - Pericardium, extracardiac: There was no pericardial effusion.          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 Patient has significant type B constitutional symptoms.  MRI Brain with evidence of leptomeningeal involvement consistent with the patients symptoms of chin numbnes though LP was unrevealing. BM packed with lymphoma/leukemia. LDH down from 3000's to 1900 to 1215 due to steroids. (tolerating steroids well)  2) Chin numbness due to possible leptomeningeal involvement. Notes headaches on and off with no other overt focal neurological deficits. Patient notes headaches have resolved. 3) Thrombocytopenia likely related to lymphoma. Platelets 115k after transfusion yesterday. 4) Leukoerythroblastic picture likely from bone marrow Lymphoma involvement. 4) Abdominal fatigue/back pain - likely from retroperitoneal LNadenopathy - back pain improved. 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 -We'll start EPOCH-R cycle 1  today . Etoposide/vincristine/doxorubicin dose reduce by 25% will do infusional treatment for only 3 out of 4 days .cyclophosphamide dose reduced to 400 mg/m  -Monitor blood counts . -Will get G-CSF support  -her pre-treatment cytopenia might be treatment limiting but we hope they improve as her marrow clears. -PET/CT ordered stat to be done as inpatient since patient cannot be safetly discharged prior to treatment. hopefully  can be done today or tomorrow. -continue on Allopurinol for TLS prophylaxis given high risk of Tumor lysis syndrome with daily tumor lysis labs (cmp, uric acid, phosphorus after treatment started) -continue on dexamethasone $RemoveBeforeDE'4mg'[id number]$  IV q6h for now with GI prophylaxis. We will hold off on prednisone as part of her CTX regimen. -continue PT and encourage frequent ambulation. -Social worker consultation to help with POA paperwork as per daughters request as well as to help with discharge planning .will likely need home care services versus SNF . Family is very supportive .  Plan of care discussed in details with patient and her family at bedside.  I spent 30 minutes counseling the patient face to face. The total time spent in the appointment was 40 minutes and more than 50% was on counseling and direct patient cares.    Sullivan Lone MD [place] AAHIVMS [facility] Hematology/Oncology Physician [place]  (Office):       [phone] (Work cell):  [phone] (Fax):           [phone]

## 2015-08-26 NOTE — Progress Notes (Signed)
Chemotherapy dosage calculation verified with Regina Baldwin RN.  

## 2015-08-26 NOTE — Progress Notes (Signed)
PT Cancellation Note  Patient Details Name: Kristen Baker MRN: [id number] DOB: 1933-06-21   Cancelled Treatment:    Reason Eval/Treat Not Completed: Patient at procedure or test/unavailable Pt scheduled for Port-A-Cath today   [name],[name] 08/26/2015, 10:38 AM [name], PT, DPT 08/26/2015 Pager: [phone]

## 2015-08-26 NOTE — Progress Notes (Signed)
OT Cancellation Note  Patient Details Name: Kristen Baker MRN: [id number] DOB: 1932-11-10   Cancelled Treatment:    Reason Eval/Treat Not Completed: Medical issues which prohibited therapy Pt on bedrest for 4 hours. Will  Check on pt next day  [place], Mickel Baas, [place] 08/26/2015, 2:36 PM

## 2015-08-27 ENCOUNTER — Inpatient Hospital Stay (HOSPITAL_COMMUNITY): Payer: Medicare Other

## 2015-08-27 DIAGNOSIS — N179 Acute kidney failure, unspecified: Secondary | ICD-10-CM

## 2015-08-27 DIAGNOSIS — K5909 Other constipation: Secondary | ICD-10-CM

## 2015-08-27 LAB — COMPREHENSIVE METABOLIC PANEL
ALK PHOS: 70 U/L (ref 38–126)
ALT: 20 U/L (ref 14–54)
AST: 11 U/L — AB (ref 15–41)
Albumin: 3.2 g/dL — ABNORMAL LOW (ref 3.5–5.0)
Anion gap: 6 (ref 5–15)
BILIRUBIN TOTAL: 0.5 mg/dL (ref 0.3–1.2)
BUN: 23 mg/dL — AB (ref 6–20)
CALCIUM: 8 mg/dL — AB (ref 8.9–10.3)
CHLORIDE: 102 mmol/L (ref 101–111)
CO2: 27 mmol/L (ref 22–32)
CREATININE: 0.67 mg/dL (ref 0.44–1.00)
Glucose, Bld: 132 mg/dL — ABNORMAL HIGH (ref 65–99)
Potassium: 4.7 mmol/L (ref 3.5–5.1)
Sodium: 135 mmol/L (ref 135–145)
TOTAL PROTEIN: 5.5 g/dL — AB (ref 6.5–8.1)

## 2015-08-27 LAB — CBC WITH DIFFERENTIAL/PLATELET
BASOS ABS: 0 10*3/uL (ref 0.0–0.1)
Basophils Relative: 0 %
EOS PCT: 0 %
Eosinophils Absolute: 0 10*3/uL (ref 0.0–0.7)
HEMATOCRIT: 30.1 % — AB (ref 36.0–46.0)
Hemoglobin: 9.9 g/dL — ABNORMAL LOW (ref 12.0–15.0)
LYMPHS ABS: 0.4 10*3/uL — AB (ref 0.7–4.0)
LYMPHS PCT: 14 %
MCH: 28.9 pg (ref 26.0–34.0)
MCHC: 32.9 g/dL (ref 30.0–36.0)
MCV: 87.8 fL (ref 78.0–100.0)
Monocytes Absolute: 0.5 10*3/uL (ref 0.1–1.0)
Monocytes Relative: 18 %
NEUTROS ABS: 1.9 10*3/uL (ref 1.7–7.7)
Neutrophils Relative %: 68 %
PLATELETS: 135 10*3/uL — AB (ref 150–400)
RBC: 3.43 MIL/uL — AB (ref 3.87–5.11)
RDW: 16.5 % — ABNORMAL HIGH (ref 11.5–15.5)
WBC: 2.8 10*3/uL — AB (ref 4.0–10.5)

## 2015-08-27 LAB — RENAL FUNCTION PANEL
ANION GAP: 7 (ref 5–15)
Albumin: 3.2 g/dL — ABNORMAL LOW (ref 3.5–5.0)
BUN: 24 mg/dL — AB (ref 6–20)
CHLORIDE: 103 mmol/L (ref 101–111)
CO2: 26 mmol/L (ref 22–32)
Calcium: 8 mg/dL — ABNORMAL LOW (ref 8.9–10.3)
Creatinine, Ser: 0.68 mg/dL (ref 0.44–1.00)
GFR calc Af Amer: >60 mL/min (ref >60)
GFR calc non Af Amer: >60 mL/min (ref >60)
GLUCOSE: 159 mg/dL — AB (ref 65–99)
POTASSIUM: 4.7 mmol/L (ref 3.5–5.1)
Phosphorus: 4.2 mg/dL (ref 2.5–4.6)
Sodium: 136 mmol/L (ref 135–145)

## 2015-08-27 LAB — URIC ACID: Uric Acid, Serum: 3.6 mg/dL (ref 2.3–6.6)

## 2015-08-27 LAB — GLUCOSE, CAPILLARY: GLUCOSE-CAPILLARY: 126 mg/dL — AB (ref 65–99)

## 2015-08-27 MED ORDER — FUROSEMIDE 20 MG PO TABS: 20.0000 mg | ORAL_TABLET | Freq: Every day | ORAL | Status: DC

## 2015-08-27 MED ORDER — PALONOSETRON HCL INJECTION 0.25 MG/5ML
0.2500 mg | Freq: Once | INTRAVENOUS | Status: AC
  Administered 2015-08-27: 0.25 mg via INTRAVENOUS
  Filled 2015-08-27: qty 5

## 2015-08-27 MED ORDER — SODIUM CHLORIDE 0.9 % IV SOLN
INTRAVENOUS | Status: AC
  Administered 2015-08-27 (×2): via INTRAVENOUS

## 2015-08-27 MED ORDER — ETOPOSIDE CHEMO INJECTION 500 MG/25ML
Freq: Once | INTRAVENOUS | Status: AC
  Administered 2015-08-27: 17:00:00 via INTRAVENOUS
  Filled 2015-08-27: qty 10

## 2015-08-27 MED ORDER — DEXAMETHASONE SODIUM PHOSPHATE 4 MG/ML IJ SOLN
4.0000 mg | Freq: Three times a day (TID) | INTRAMUSCULAR | Status: DC
  Administered 2015-08-27 – 2015-08-29 (×4): 4 mg via INTRAVENOUS
  Filled 2015-08-27 (×4): qty 1

## 2015-08-27 MED ORDER — DEXAMETHASONE SODIUM PHOSPHATE 4 MG/ML IJ SOLN
8.0000 mg | Freq: Once | INTRAMUSCULAR | Status: AC
  Administered 2015-08-27: 8 mg via INTRAVENOUS
  Filled 2015-08-27: qty 2

## 2015-08-27 MED ORDER — FLUDEOXYGLUCOSE F - 18 (FDG) INJECTION
8.7500 | Freq: Once | INTRAVENOUS | Status: DC | PRN
  Administered 2015-08-27: 8.75 via INTRAVENOUS
  Filled 2015-08-27: qty 8.75

## 2015-08-27 NOTE — Progress Notes (Signed)
Physical Therapy Treatment Patient Details Name: Kristen Baker MRN: [id number] DOB: 1933-01-06 Today's Date: 2015/09/25    History of Present Illness Patient is a 81 y/o female presens with 1 month history of progressive nausea, vomiting, night sweats, weight loss, lower abdominal pain, headache, chin numbness, and easy brusing. Labs were notable for platelet count of 34; CT of her abdomen and pelvis showed retroperitoneal and lower posterior mediastinal lymphadenopathy suggestive of lymphoma, and a subtle increased sacral epidural soft tissue that cannot be excluded as a spinal metastasis. PMH of cervical ca. Awaiting peripheral blood flow cytometry, Bone Marrow and LN biopsy results 10/13.  Pt s/p port-a-cath placement and started chemotherapy 08/26/15.    PT Comments    Pt mobilizing well.  Will check back likely once more as pt close to meeting goals.  Follow Up Recommendations  Home health PT;Supervision - Intermittent     Equipment Recommendations  3in1 (PT)    Recommendations for Other Services       Precautions / Restrictions Precautions Precautions: Fall Precaution Comments: chemo    Mobility  Bed Mobility Overal bed mobility: Modified Independent                Transfers Overall transfer level: Modified independent                  Ambulation/Gait Ambulation/Gait assistance: Supervision Ambulation Distance (Feet): 400 Feet Assistive device:  (pushed IV pole) Gait Pattern/deviations: Step-through pattern;Decreased stride length     General Gait Details: no unsteadiness, mildly SOB and reports increased weakness and fatigue end of ambulation   Stairs            Wheelchair Mobility    Modified Rankin (Stroke Patients Only)       Balance                                    Cognition Arousal/Alertness: Awake/alert Behavior During Therapy: WFL for tasks assessed/performed Overall Cognitive Status: Within Functional  Limits for tasks assessed                      Exercises      General Comments        Pertinent Vitals/Pain Pain Assessment: No/denies pain    Home Living                      Prior Function            PT Goals (current goals can now be found in the care plan section) Progress towards PT goals: Progressing toward goals    Frequency  Min 3X/week    PT Plan Current plan remains appropriate    Co-evaluation             End of Session   Activity Tolerance: Patient tolerated treatment well Patient left: in bed;with call bell/phone within reach     Time: 1429-1438 PT Time Calculation (min) (ACUTE ONLY): 9 min  Charges:  $Gait Training: 8-22 mins                    G Codes:      Roseanne Juenger,[name] 2015-09-25, 4:49 PM [name], PT, DPT 25-Sep-2015 Pager: [phone]

## 2015-08-27 NOTE — Progress Notes (Signed)
[facility] PROGRESS NOTE    Progress Note   Kristen Baker VEL:[id number] DOB: May 02, 1933 DOA: 08/18/2015 PCP: No primary care provider on file.   Brief Narrative:   Kristen Baker is an 81 y.o. female the presented to the hospital with abdominal pain headaches CT of the abdomen and pelvis revealed retroperitoneal lymphadenopathy, she's been complaining of weight loss for the past 2-3 months, unintentional. Also found to have thrombocytopenia and MRI showed leptomeningeal involvement. Oncology was consulted and recommended IR to proceed with a bone marrow biopsy and lymph node biopsy. This showed Burkitt's lymphoma, Port-A-Cath placed on 08/26/2015 she was transfused 1 unit of platelets as her platelet counts less than 50,000. Also an intrathecal catheter was placed for chemotherapy infusion.  Assessment/Plan:  Burkitt lymphoma ([place]): - Core needle biopsy performed on 08/21/2015 with Flow cytometry was compatible with non-Hodgkin B-cell lymphoma. - She status post 1 unit of platelets on 08/26/2015 - PET scan is pending. LDH is mildly improved. - Continue allopurinol and IV dexamethasone, 4 tumor lysis syndrome prophylaxis, electrolytes are stable check a phosphorus level. Start gentle IV fluids continue to monitor electrolytes. Daily weights. - Oncology will  start EPOCH-R + IT methotrexate on Monday 08/26/2015. - Get a PT OT consult.  Acute kidney injury: Resolved with IV hydration. Avoid NSAIDs.  Pancytopenia: Due to Burkitt's lymphoma continue to monitor. She is status post 1 unit of platelets.  DVT Prophylaxis: - Lovenox ordered.  Family Communication: none Disposition Plan: Home when stable. Code Status:     Code Status Orders        Start     Ordered   08/18/15 2058  Full code   Continuous     08/18/15 2057    Advance Directive Documentation        Most Recent Value   Type of Advance Directive  Living will   Pre-existing out of facility DNR order (yellow  form or pink MOST form)     "MOST" Form in Place?          IV Access:    Peripheral IV   Procedures and diagnostic studies:   Ir Fluoro Guide Cv Line Right  08/26/2015  CLINICAL DATA:  ACCESS FOR CHEMOTHERAPY, BURKITT'S LYMPHOMA EXAM: RIGHT INTERNAL JUGULAR SINGLE LUMEN POWER PORT CATHETER INSERTION Date:  10/17/201610/17/2016 11:08 am Radiologist:  M. Daryll Brod, MD Guidance:  Ultrasound and fluoroscopic FLUOROSCOPY TIME:  30 seconds, 3 mGy MEDICATIONS AND MEDICAL HISTORY: 2 g Ancefadministered within 1 hour of the procedure.3 mg Versed, 50 mcg fentanyl ANESTHESIA/SEDATION: 30 minutes CONTRAST:  None. COMPLICATIONS: None immediate PROCEDURE: Informed consent was obtained from the patient following explanation of the procedure, risks, benefits and alternatives. The patient understands, agrees and consents for the procedure. All questions were addressed. A time out was performed. Maximal barrier sterile technique utilized including caps, mask, sterile gowns, sterile gloves, large sterile drape, hand hygiene, and 2% chlorhexidine scrub. Under sterile conditions and local anesthesia, right internal jugular micropuncture venous access was performed. Access was performed with ultrasound. Images were obtained for documentation. A guide wire was inserted followed by a transitional dilator. This allowed insertion of a guide wire and catheter into the IVC. Measurements were obtained from the SVC / RA junction back to the right IJ venotomy site. In the right infraclavicular chest, a subcutaneous pocket was created over the second anterior rib. This was done under sterile conditions and local anesthesia. 1% lidocaine with epinephrine was utilized for this. A 2.5 cm incision was made in the  skin. Blunt dissection was performed to create a subcutaneous pocket over the right pectoralis major muscle. The pocket was flushed with saline vigorously. There was adequate hemostasis. The port catheter was assembled and  checked for leakage. The port catheter was secured in the pocket with two retention sutures. The tubing was tunneled subcutaneously to the right venotomy site and inserted into the SVC/RA junction through a valved peel-away sheath. Position was confirmed with fluoroscopy. Images were obtained for documentation. The patient tolerated the procedure well. No immediate complications. Incisions were closed in a two layer fashion with 4 - 0 Vicryl suture. Dermabond was applied to the skin. The port catheter was accessed, blood was aspirated followed by saline and heparin flushes. Needle was removed. A dry sterile dressing was applied. IMPRESSION: Ultrasound and fluoroscopically guided right internal jugular single lumen power port catheter insertion. Tip in the SVC/RA junction. Catheter ready for use. Electronically Signed   By: Jerilynn Mages.  Shick M.D.   On: 08/26/2015 11:44   Ir US Guide Vasc Access Right  08/26/2015  CLINICAL DATA:  ACCESS FOR CHEMOTHERAPY, BURKITT'S LYMPHOMA EXAM: RIGHT INTERNAL JUGULAR SINGLE LUMEN POWER PORT CATHETER INSERTION Date:  10/17/201610/17/2016 11:08 am Radiologist:  M. Daryll Brod, MD Guidance:  Ultrasound and fluoroscopic FLUOROSCOPY TIME:  30 seconds, 3 mGy MEDICATIONS AND MEDICAL HISTORY: 2 g Ancefadministered within 1 hour of the procedure.3 mg Versed, 50 mcg fentanyl ANESTHESIA/SEDATION: 30 minutes CONTRAST:  None. COMPLICATIONS: None immediate PROCEDURE: Informed consent was obtained from the patient following explanation of the procedure, risks, benefits and alternatives. The patient understands, agrees and consents for the procedure. All questions were addressed. A time out was performed. Maximal barrier sterile technique utilized including caps, mask, sterile gowns, sterile gloves, large sterile drape, hand hygiene, and 2% chlorhexidine scrub. Under sterile conditions and local anesthesia, right internal jugular micropuncture venous access was performed. Access was performed with  ultrasound. Images were obtained for documentation. A guide wire was inserted followed by a transitional dilator. This allowed insertion of a guide wire and catheter into the IVC. Measurements were obtained from the SVC / RA junction back to the right IJ venotomy site. In the right infraclavicular chest, a subcutaneous pocket was created over the second anterior rib. This was done under sterile conditions and local anesthesia. 1% lidocaine with epinephrine was utilized for this. A 2.5 cm incision was made in the skin. Blunt dissection was performed to create a subcutaneous pocket over the right pectoralis major muscle. The pocket was flushed with saline vigorously. There was adequate hemostasis. The port catheter was assembled and checked for leakage. The port catheter was secured in the pocket with two retention sutures. The tubing was tunneled subcutaneously to the right venotomy site and inserted into the SVC/RA junction through a valved peel-away sheath. Position was confirmed with fluoroscopy. Images were obtained for documentation. The patient tolerated the procedure well. No immediate complications. Incisions were closed in a two layer fashion with 4 - 0 Vicryl suture. Dermabond was applied to the skin. The port catheter was accessed, blood was aspirated followed by saline and heparin flushes. Needle was removed. A dry sterile dressing was applied. IMPRESSION: Ultrasound and fluoroscopically guided right internal jugular single lumen power port catheter insertion. Tip in the SVC/RA junction. Catheter ready for use. Electronically Signed   By: Jerilynn Mages.  Shick M.D.   On: 08/26/2015 11:44   Dg Fluoro Guide Lumbar Puncture  08/26/2015  CLINICAL DATA:  Diagnostic lumbar puncture with intrathecal chemotherapy injection EXAM: FLUOROSCOPICALLY GUIDED LUMBAR PUNCTURE  FOR INTRATHECAL CHEMOTHERAPY FLUOROSCOPY TIME:  dictate in minutes and seconds Radiation Exposure Index (as provided by the fluoroscopic device): Not  available on this device If the device does not provide the exposure index: Fluoroscopy Time (in minutes and seconds):  1 minutes 32 seconds Number of Acquired Images:  1 PROCEDURE: Informed consent was obtained from the patient prior to the procedure, including potential complications of headache, allergy, and pain. With the patient prone, the lower back was prepped with Betadine. 1% Lidocaine was used for local anesthesia. Lumbar puncture was performed at the L3-4 level without success, likely due to ligamentous ossification. After numbing, attempt was then made at the L2-3 level, with dry tap despite good needle placement. After numbing, needle placed at the L5-S1 level with easy access to somewhat low pressure CSF (patient NPO for port placement this am). A 20 gauge needle was used. 11 cc of clear CSF were obtained for laboratory studies. The entire syringe of laboratory prepared methotrexate was then injected into the subarachnoid space. The patient tolerated the procedure well without apparent complication. IMPRESSION: Diagnostic lumbar puncture and intrathecal injection of chemotherapy without complication. Electronically Signed   By: Monte Fantasia M.D.   On: 08/26/2015 15:04     Medical Consultants:    None.  Anti-Infectives:   Anti-infectives    Start     Dose/Rate Route Frequency Ordered Stop   08/26/15 1015  ceFAZolin (ANCEF) IVPB 2 g/50 mL premix     2 g 100 mL/hr over 30 Minutes Intravenous  Once 08/26/15 1005 08/26/15 1036   08/26/15 0938  ceFAZolin (ANCEF) 2-3 GM-% IVPB SOLR    Comments:  Margaretmary Dys   : cabinet override      08/26/15 7026 08/26/15 1008      Subjective:    Kristen Baker feels weak. No complains.  Objective:    Filed Vitals:   08/26/15 1225 08/26/15 1437 08/26/15 2031 08/27/15 0600  BP: 131/58 136/51 126/47 107/55  Pulse:  65 59 61  Temp: 97.7 F (36.5 C) 97.5 F (36.4 C) 97.9 F (36.6 C) 98.1 F (36.7 C)  TempSrc: Oral Oral Oral Oral  Resp:  $Remo'16 16 16 14  'Xhyfu$ Height:      Weight:      SpO2: 96% 98% 95% 97%    Intake/Output Summary (Last 24 hours) at 08/27/15 0926 Last data filed at 08/27/15 0600  Gross per 24 hour  Intake    400 ml  Output   2400 ml  Net  -2000 ml   Filed Weights   08/24/15 0546 08/24/15 1543 08/26/15 0423  Weight: 79.833 kg (176 lb) 80.468 kg (177 lb 6.4 oz) 79.516 kg (175 lb 4.8 oz)    Exam: Gen:  NAD Cardiovascular:  RRR, No M/R/G Chest and lungs:   CTAB Abdomen:  Abdomen soft, NT/ND, + BS Extremities:  No C/E/C   Data Reviewed:    Labs: Basic Metabolic Panel:  Recent Labs Lab 08/21/15 0607 08/22/15 0815 08/22/15 1630 08/24/15 0515 08/26/15 0426 08/27/15 0426  NA 137 140  --  136 135 136  K 4.0 4.1  --  4.6 4.7 4.7  CL 98* 99*  --  98* 101 103  CO2 26 27  --  $R'28 27 26  'zp$ GLUCOSE 174* 168*  --  167* 158* 159*  BUN 15 25*  --  19 25* 24*  CREATININE 0.99 0.87  --  0.75 0.74 0.68  CALCIUM 8.5* 8.9  --  8.1* 8.2* 8.0*  PHOS  --   --  4.0  --  4.3 4.2   GFR Estimated Creatinine Clearance: 61.2 mL/min (by C-G formula based on Cr of 0.68). Liver Function Tests:  Recent Labs Lab 08/24/15 0515 08/26/15 0426 08/27/15 0426  AST 27 19  --   ALT 22 28  --   ALKPHOS 67 63  --   BILITOT 0.4 0.6  --   PROT 5.0* 5.2*  --   ALBUMIN 3.0* 3.1* 3.2*   No results for input(s): LIPASE, AMYLASE in the last 168 hours. No results for input(s): AMMONIA in the last 168 hours. Coagulation profile  Recent Labs Lab 08/24/15 0515  INR 1.14    CBC:  Recent Labs Lab 08/21/15 0607 08/21/15 1032 08/22/15 0815 08/24/15 0515 08/26/15 0426  WBC 7.9 8.2 6.2 2.9* 2.4*  NEUTROABS 3.8  --   --  1.5*  --   HGB 11.8* 12.4 10.6* 10.0* 9.2*  HCT 35.3* 36.4 32.2* 29.8* 27.9*  MCV 87.6 86.1 86.3 87.1 88.0  PLT 35* 59* 47* 38* 115*   Cardiac Enzymes: No results for input(s): CKTOTAL, CKMB, CKMBINDEX, TROPONINI in the last 168 hours. BNP (last 3 results) No results for input(s): PROBNP in the last  8760 hours. CBG: No results for input(s): GLUCAP in the last 168 hours. D-Dimer: No results for input(s): DDIMER in the last 72 hours. Hgb A1c: No results for input(s): HGBA1C in the last 72 hours. Lipid Profile: No results for input(s): CHOL, HDL, LDLCALC, TRIG, CHOLHDL, LDLDIRECT in the last 72 hours. Thyroid function studies: No results for input(s): TSH, T4TOTAL, T3FREE, THYROIDAB in the last 72 hours.  Invalid input(s): FREET3 Anemia work up: No results for input(s): VITAMINB12, FOLATE, FERRITIN, TIBC, IRON, RETICCTPCT in the last 72 hours. Sepsis Labs:  Recent Labs Lab 08/21/15 1032 08/22/15 0815 08/24/15 0515 08/26/15 0426  WBC 8.2 6.2 2.9* 2.4*   Microbiology Recent Results (from the past 240 hour(s))  CSF culture with Stat gram stain     Status: None   Collection Time: 08/20/15  4:55 PM  Result Value Ref Range Status   Specimen Description CSF  Final   Special Requests NONE  Final   Gram Stain   Final    WBC PRESENT, PREDOMINANTLY MONONUCLEAR NO ORGANISMS SEEN CYTOSPIN    Culture NO GROWTH 3 DAYS  Final   Report Status 08/23/2015 FINAL  Final  Gram stain     Status: None   Collection Time: 08/26/15  2:22 PM  Result Value Ref Range Status   Specimen Description CSF  Final   Special Requests NONE  Final   Gram Stain   Final    NO ORGANISMS SEEN WBC SEEN Gram Stain Report Called to,Read Back By and Verified With: D TORRES AT 1517 ON 10.17.2016 BY NBROOKS    Report Status 08/26/2015 FINAL  Final     Medications:   . sodium chloride   Intravenous Once  . allopurinol  100 mg Oral BID  . dexamethasone  4 mg Intravenous 4 times per day  . dexamethasone  8 mg Intravenous Once  . DOXOrubicin/vinCRIStine/etoposide CHEMO IV infusion for Inpatient CI   Intravenous Once  . DOXOrubicin/vinCRIStine/etoposide CHEMO IV infusion for Inpatient CI   Intravenous Once  . palonosetron  0.25 mg Intravenous Once  . pantoprazole  40 mg Oral Daily  . polyethylene glycol   17 g Oral Daily  . senna-docusate  2 tablet Oral QHS  . Vitamin D (Ergocalciferol)  50,000 Units Oral Q7 days   Continuous Infusions: . sodium  chloride      Time spent: 25 min   LOS: 7 days   Charlynne Cousins  [facility] Pager [phone]  *Please refer to [email or web address], password [email or web address] to get updated schedule on who will round on this patient, as hospitalists switch teams weekly. If 7PM-7AM, please contact night-coverage at [email or web address], password [email or web address] for any overnight needs.  08/27/2015, 9:26 AM

## 2015-08-27 NOTE — Progress Notes (Signed)
Patient's BSA, chemo doses and dilutions verified for Doxorubicin, Etoposide and Vincristine with 2nd RN Reyne Dumas.

## 2015-08-27 NOTE — Care Management Note (Signed)
Case Management Note  Patient Details  Name: Kristen Baker MRN: [id number] Date of Birth: May 09, 1933  Subjective/Objective:            81 yo admitted with new diagnosis Burkitt lymphoma        Action/Plan: From home alone  Expected Discharge Date:                  Expected Discharge Plan:  [name]  In-House Referral:     Discharge planning Services  CM Consult  Post Acute Care Choice:  Home Health Choice offered to:  Patient  DME Arranged:  3-N-1, [place] rolling DME Agency:  [place]:  PT [facility] Agency:     Status of Service:  In process, will continue to follow  Medicare Important Message Given:  Yes-third notification given Date Medicare IM Given:    Medicare IM give by:    Date Additional Medicare IM Given:    Additional Medicare Important Message give by:     If discussed at [place] of Stay Meetings, dates discussed:    Additional Comments:   This Cm met with pt at bedside to discuss disposition. Pt is requesting 3 in 1 and RW for home use. Orders received and [facility] DME rep contacted. Pt unsure if she would like HHPT at this time. She would like to see how she does as she progresses in the hospital. [place] list left with pt and CM will continue to follow.  Lynnell Catalan, RN 08/27/2015, 3:32 PM

## 2015-08-28 DIAGNOSIS — Z5111 Encounter for antineoplastic chemotherapy: Secondary | ICD-10-CM | POA: Insufficient documentation

## 2015-08-28 DIAGNOSIS — C8378 Burkitt lymphoma, lymph nodes of multiple sites: Principal | ICD-10-CM

## 2015-08-28 DIAGNOSIS — G039 Meningitis, unspecified: Secondary | ICD-10-CM

## 2015-08-28 DIAGNOSIS — C8510 Unspecified B-cell lymphoma, unspecified site: Secondary | ICD-10-CM

## 2015-08-28 DIAGNOSIS — N179 Acute kidney failure, unspecified: Secondary | ICD-10-CM

## 2015-08-28 LAB — COMPREHENSIVE METABOLIC PANEL
ALT: 18 U/L (ref 14–54)
AST: 11 U/L — AB (ref 15–41)
Albumin: 2.8 g/dL — ABNORMAL LOW (ref 3.5–5.0)
Alkaline Phosphatase: 63 U/L (ref 38–126)
Anion gap: 6 (ref 5–15)
BUN: 22 mg/dL — ABNORMAL HIGH (ref 6–20)
CHLORIDE: 104 mmol/L (ref 101–111)
CO2: 25 mmol/L (ref 22–32)
CREATININE: 0.63 mg/dL (ref 0.44–1.00)
Calcium: 7.8 mg/dL — ABNORMAL LOW (ref 8.9–10.3)
GFR calc non Af Amer: >60 mL/min (ref >60)
Glucose, Bld: 144 mg/dL — ABNORMAL HIGH (ref 65–99)
POTASSIUM: 4.6 mmol/L (ref 3.5–5.1)
SODIUM: 135 mmol/L (ref 135–145)
Total Bilirubin: 0.7 mg/dL (ref 0.3–1.2)
Total Protein: 4.6 g/dL — ABNORMAL LOW (ref 6.5–8.1)

## 2015-08-28 LAB — CBC WITH DIFFERENTIAL/PLATELET
BASOS ABS: 0 10*3/uL (ref 0.0–0.1)
Basophils Relative: 1 %
EOS ABS: 0 10*3/uL (ref 0.0–0.7)
Eosinophils Relative: 0 %
HCT: 25.1 % — ABNORMAL LOW (ref 36.0–46.0)
Hemoglobin: 8.4 g/dL — ABNORMAL LOW (ref 12.0–15.0)
Lymphocytes Relative: 19 %
Lymphs Abs: 0.4 10*3/uL — ABNORMAL LOW (ref 0.7–4.0)
MCH: 29.4 pg (ref 26.0–34.0)
MCHC: 33.5 g/dL (ref 30.0–36.0)
MCV: 87.8 fL (ref 78.0–100.0)
MONOS PCT: 13 %
Monocytes Absolute: 0.3 10*3/uL (ref 0.1–1.0)
NEUTROS ABS: 1.5 10*3/uL — AB (ref 1.7–7.7)
NEUTROS PCT: 67 %
PLATELETS: 123 10*3/uL — AB (ref 150–400)
RBC: 2.86 MIL/uL — AB (ref 3.87–5.11)
RDW: 16.3 % — ABNORMAL HIGH (ref 11.5–15.5)
WBC: 2.2 10*3/uL — AB (ref 4.0–10.5)

## 2015-08-28 LAB — RENAL FUNCTION PANEL
Albumin: 2.8 g/dL — ABNORMAL LOW (ref 3.5–5.0)
Anion gap: 5 (ref 5–15)
BUN: 22 mg/dL — ABNORMAL HIGH (ref 6–20)
CALCIUM: 7.8 mg/dL — AB (ref 8.9–10.3)
CO2: 26 mmol/L (ref 22–32)
Chloride: 105 mmol/L (ref 101–111)
Creatinine, Ser: 0.62 mg/dL (ref 0.44–1.00)
Glucose, Bld: 144 mg/dL — ABNORMAL HIGH (ref 65–99)
PHOSPHORUS: 4 mg/dL (ref 2.5–4.6)
Potassium: 4.7 mmol/L (ref 3.5–5.1)
SODIUM: 136 mmol/L (ref 135–145)

## 2015-08-28 LAB — VITAMIN D 25 HYDROXY (VIT D DEFICIENCY, FRACTURES): Vit D, 25-Hydroxy: 27.7 ng/mL — ABNORMAL LOW (ref 30.0–100.0)

## 2015-08-28 LAB — URIC ACID: Uric Acid, Serum: 3.6 mg/dL (ref 2.3–6.6)

## 2015-08-28 LAB — LACTATE DEHYDROGENASE: LDH: 368 U/L — AB (ref 98–192)

## 2015-08-28 LAB — TROPONIN I: Troponin I: <0.03 ng/mL (ref <0.031)

## 2015-08-28 LAB — LYMPHOCYTE SUBSETS, FLOW CYTOMETRY (INPT)

## 2015-08-28 MED ORDER — PALONOSETRON HCL INJECTION 0.25 MG/5ML
0.2500 mg | Freq: Once | INTRAVENOUS | Status: AC
  Administered 2015-08-28: 0.25 mg via INTRAVENOUS
  Filled 2015-08-28: qty 5

## 2015-08-28 MED ORDER — DEXAMETHASONE SODIUM PHOSPHATE 4 MG/ML IJ SOLN
8.0000 mg | Freq: Once | INTRAMUSCULAR | Status: AC
  Administered 2015-08-28: 8 mg via INTRAVENOUS
  Filled 2015-08-28: qty 2

## 2015-08-28 MED ORDER — VINCRISTINE SULFATE CHEMO INJECTION 1 MG/ML
Freq: Once | INTRAVENOUS | Status: AC
  Administered 2015-08-28: 16:00:00 via INTRAVENOUS
  Filled 2015-08-28: qty 10

## 2015-08-28 MED ORDER — VITAMIN D (ERGOCALCIFEROL) 1.25 MG (50000 UNIT) PO CAPS: 50000.0000 [IU] | ORAL_CAPSULE | ORAL | Status: DC

## 2015-08-28 MED ORDER — SODIUM BICARBONATE/SODIUM CHLORIDE MOUTHWASH
Freq: Four times a day (QID) | OROMUCOSAL | Status: DC
Start: 2015-08-28 — End: 2015-08-31
  Administered 2015-08-29 – 2015-08-30 (×6): via OROMUCOSAL
  Filled 2015-08-28: qty 1000

## 2015-08-28 MED ORDER — MORPHINE SULFATE (PF) 2 MG/ML IV SOLN: 2.0000 mg | INTRAVENOUS | Status: DC | PRN

## 2015-08-28 NOTE — Progress Notes (Signed)
[name] Kitchen   HEMATOLOGY/ONCOLOGY INPATIENT PROGRESS NOTE  Date of Service: 08/28/2015  Inpatient Attending: .Annita Brod, MD   SUBJECTIVE  Patient was seen around noon. No acute new symptoms. No CP/SOB. No further nausea. Thankful for all the cares thus far. No fevers/chills.  OBJECTIVE:  PHYSICAL EXAMINATION: . Filed Vitals:   08/28/15 0501 08/28/15 1421 08/28/15 1733 08/28/15 2211  BP: 126/55 126/50 137/54 141/61  Pulse: 62 75 65 59  Temp: 97.5 F (36.4 C) 98 F (36.7 C)  97.3 F (36.3 C)  TempSrc: Oral Oral  Oral  Resp: 14 18 20 20   Height:      Weight:      SpO2: 96% 96% 98% 97%   Filed Weights   08/24/15 0546 08/24/15 1543 08/26/15 0423  Weight: 176 lb (79.833 kg) 177 lb 6.4 oz (80.468 kg) 175 lb 4.8 oz (79.516 kg)   .Body mass index is 27.29 kg/(m^2).   GENERAL:alert, in no acute distress and comfortable SKIN: skin color, texture, turgor are normal, no rashes or significant lesions EYES: normal, conjunctiva are pink and non-injected, sclera clear OROPHARYNX:no exudate, no erythema and lips, buccal mucosa, and tongue normal  NECK: supple, no JVD, thyroid normal size, non-tender, without nodularity LYMPH: no palpable lymphadenopathy in the cervical, axillary or inguinal LUNGS: clear to auscultation with normal respiratory effort HEART: regular rate & rhythm, no murmurs and no lower extremity edema ABDOMEN: abdomen soft, non-tender, normoactive bowel sounds  Musculoskeletal: no cyanosis of digits and no clubbing  PSYCH: alert & oriented x 3 with fluent speech NEURO: no focal motor/sensory deficits.  MEDICAL HISTORY:  Past Medical History  Diagnosis Date  . Cancer [facility])     cervical cancer    SURGICAL HISTORY: Past Surgical History  Procedure Laterality Date  . Cholecystectomy    . Tonsillectomy    . Abdominal hysterectomy    . Abdominal surgery      2-3 rd of colon removed    SOCIAL HISTORY: Social History   Social History  . Marital  Status: Married    Spouse Name: N/A  . Number of Children: N/A  . Years of Education: N/A   Occupational History  . Not on file.   Social History Main Topics  . Smoking status: Never Smoker   . Smokeless tobacco: Not on file  . Alcohol Use: No  . Drug Use: No  . Sexual Activity: Not on file   Other Topics Concern  . Not on file   Social History Narrative    FAMILY HISTORY: History reviewed. No pertinent family history.  ALLERGIES:  is allergic to cabbage; onion; shellfish allergy; sulfa antibiotics; and zofran.  MEDICATIONS:  Scheduled Meds: . sodium chloride   Intravenous Once  . allopurinol  100 mg Oral BID  . dexamethasone  4 mg Intravenous 3 times per day  . DOXOrubicin/vinCRIStine/etoposide CHEMO IV infusion for Inpatient CI   Intravenous Once  . pantoprazole  40 mg Oral Daily  . polyethylene glycol  17 g Oral Daily  . senna-docusate  2 tablet Oral QHS  . Vitamin D (Ergocalciferol)  50,000 Units Oral Q7 days   Continuous Infusions: . sodium chloride 20 mL/hr at 08/28/15 1037   PRN Meds:.alteplase, Cold Pack, fludeoxyglucose F - 18, heparin lock flush, heparin lock flush, Hot Pack, iohexol, morphine injection, promethazine, sodium chloride, sodium chloride, traMADol  REVIEW OF SYSTEMS:    10 Point review of Systems was done is negative except as noted above.   LABORATORY DATA:  I  have reviewed the data as listed  . CBC Latest Ref Rng 08/28/2015 08/27/2015 08/26/2015  WBC 4.0 - 10.5 K/uL 2.2(L) 2.8(L) 2.4(L)  Hemoglobin 12.0 - 15.0 g/dL 8.4(L) 9.9(L) 9.2(L)  Hematocrit 36.0 - 46.0 % 25.1(L) 30.1(L) 27.9(L)  Platelets 150 - 400 K/uL 123(L) 135(L) 115(L)    . CMP Latest Ref Rng 08/28/2015 08/28/2015 08/27/2015  Glucose 65 - 99 mg/dL 144(H) 144(H) 132(H)  BUN 6 - 20 mg/dL 22(H) 22(H) 23(H)  Creatinine 0.44 - 1.00 mg/dL 0.63 0.62 0.67  Sodium 135 - 145 mmol/L 135 136 135  Potassium 3.5 - 5.1 mmol/L 4.6 4.7 4.7  Chloride 101 - 111 mmol/L 104 105 102    CO2 22 - 32 mmol/L 25 26 27   Calcium 8.9 - 10.3 mg/dL 7.8(L) 7.8(L) 8.0(L)  Total Protein 6.5 - 8.1 g/dL 4.6(L) - 5.5(L)  Total Bilirubin 0.3 - 1.2 mg/dL 0.7 - 0.5  Alkaline Phos 38 - 126 U/L 63 - 70  AST 15 - 41 U/L 11(L) - 11(L)  ALT 14 - 54 U/L 18 - 20   . Lab Results  Component Value Date   LDH 368* 08/28/2015     RADIOGRAPHIC STUDIES: I have personally reviewed the radiological images as listed and agreed with the findings in the report. Ct Chest Wo Contrast  08/19/2015  CLINICAL DATA:  81 year old female with abnormal CT Abdomen and Pelvis, possible lymphoma. Initial encounter. EXAM: CT CHEST WITHOUT CONTRAST TECHNIQUE: Multidetector CT imaging of the chest was performed following the standard protocol without IV contrast. COMPARISON:  CT Abdomen and Pelvis 08/18/2015. Chest and abdominal radiographs 08/18/2015. FINDINGS: Noncontrast exam. Stable abnormal inferior posterior mediastinum and retrocrural soft tissue as described recently. No pericardial effusion. No pleural effusion. Mediastinal and axillary lymph nodes are normal. Thoracic inlet lymph nodes are normal. Calcified aortic and coronary artery atherosclerosis. Major airways are patent. There are occasional calcified granulomas. There is right upper lobe bronchiectasis with mild patchy peribronchial opacity along the major fissure (series 305, image 16). Mild tree-in-bud nodular opacity along the inferior lateral right upper lobe near the minor fissure (image 24). Minor dependent atelectasis. Degenerative changes in the thoracic spine. No acute or suspicious osseous lesion in the chest. IMPRESSION: 1. Abnormal posterior mediastinal/retrocrural soft tissue as seen on the recent CT Abdomen and Pelvis. Mediastinal, axillary, and thoracic inlet lymph nodes are normal. 2. Right upper lobe bronchiectasis and peripheral / peribronchial opacity compatible with distal airway infection or less likely scarring. Electronically Signed   By: Genevie Ann M.D.   On: 08/19/2015 23:39   [name] BW Contrast  08/19/2015  CLINICAL DATA:  Headache.  Possible lymphoma. EXAM: MRI HEAD WITHOUT AND WITH CONTRAST TECHNIQUE: Multiplanar, multiecho pulse sequences of the brain and surrounding structures were obtained without and with intravenous contrast. CONTRAST:  60mL MULTIHANCE GADOBENATE DIMEGLUMINE 529 MG/ML IV SOLN COMPARISON:  None. FINDINGS: Ventricle size normal. Cerebral volume normal. Pituitary normal in size. Craniocervical junction normal. Negative for acute infarct. Minimal hyperintensity in the periventricular white matter may represent chronic ischemia. Negative for hemorrhage or fluid collection. Negative for edema in the brain Postcontrast imaging reveals leptomeningeal enhancement diffusely and bilaterally. There is mild smooth enhancement of the meninges without nodularity. No enhancing mass lesion in the brain. IMPRESSION: Diffuse leptomeningeal enhancement. Given the history, this is concerning for lymphomatous involvement. Lumbar puncture with cytology recommended. Negative for acute infarct or mass lesion. Electronically Signed   By: Franchot Gallo M.D.   On: 08/19/2015 20:47  Ct Abdomen Pelvis W Contrast  08/18/2015  CLINICAL DATA:  81 year old female with lower abdominal pain nausea and vomiting for 1 month. Initial encounter. EXAM: CT ABDOMEN AND PELVIS WITH CONTRAST TECHNIQUE: Multidetector CT imaging of the abdomen and pelvis was performed using the standard protocol following bolus administration of intravenous contrast. CONTRAST:  59mL OMNIPAQUE IOHEXOL 300 MG/ML SOLN, 119mL OMNIPAQUE IOHEXOL 300 MG/ML SOLN COMPARISON:  [facility] Chest CTA 11/20/2012. Acute abdominal series from today FINDINGS: Mild respiratory motion artifact at the lung bases. There are chronic surgical clips about the distal thoracic esophagus and gastroesophageal junction. New since 2014 in the posterior mediastinum and tracking toward the retrocrural  space is abnormal confluent soft tissue anterior to the thoracic spine and inseparable from the medial wall of the descending thoracic aorta measuring 3 x 44 x 54 mm (AP by transverse by CC). This seems to be separate from both the aorta and esophagus. There is no anterior thoracic spine erosion. Calcified plaque along this segment of the aorta. No associated pericardial or pleural effusion. Mild bronchiectasis at both lung bases with no confluent pulmonary opacity. Degenerative changes in the spine. Chronic 10 mm lucent area in the left T12 vertebral body is stable and most resembled hemangioma in 2014. No acute or suspicious osseous lesion is identified however, there is subtle increased ventral epidural soft tissue seen in the sacrum -sagittal image 73. Mild to moderate nonspecific presacral stranding. No pelvic free fluid. Negative rectum with retained stool. Uterus surgically absent. Adnexa within normal limits. Numerous pelvic phleboliths. Negative urinary bladder. Redundant sigmoid colon. Proximal sigmoid and left colon diverticulosis with no active inflammation identified. Negative transverse colon. Negative right colon. Ileocecal valve lipoma incidentally noted. Appendix diminutive or absent. Negative terminal ileum. No dilated or abnormal small bowel loops. Occasional surgical clips in the greater omentum. Diminutive stomach. Duodenum within normal limits. Major arterial structures are patent in the abdomen and pelvis with fairly extensive calcified aortic atherosclerosis. Portal venous system appears to be patent. Retroperitoneal lymphadenopathy maximal at the lower lumbar spine level anterior to the IVC measuring up to 22 mm short axis. Numerous increased para renal and other retroperitoneal space nodes are individually up to 14 mm short axis. Mesenteric nodes in the abdomen and pelvis have a more normal appearance. No pelvic sidewall or inguinal lymphadenopathy identified. Solitary small nonspecific  low-density area in the right hepatic lobe measuring 10 mm on series 2, image 20, favor benign. Gallbladder not identified and felt to be surgically absent. No splenomegaly or splenic lesion. Negative pancreas and adrenal glands. Bilateral renal enhancement and contrast excretion within normal limits. No abdominal free fluid. IMPRESSION: 1. Retroperitoneal and lower posterior mediastinal / retrocrural soft tissue masses most compatible with lymphadenopathy, and most suggestive of Lymphoma. Some of these might be amenable to CT-guided biopsy, uncertain. 2. Subtle increased sacral epidural soft tissue, but no destructive osseous lesion identified. Nonspecific presacral stranding. Metastatic disease to the spine not excluded. Electronically Signed   By: Genevie Ann M.D.   On: 08/18/2015 18:52   Nm Pet Image Initial (pi) Skull Base To Thigh  08/27/2015  CLINICAL DATA:  Initial treatment strategy for lymphoma. EXAM: NUCLEAR MEDICINE PET VERTEX TO THIGH TECHNIQUE: 8.75 mCi F-18 FDG was injected intravenously. Full-ring PET imaging was performed from the vertex to thigh after the radiotracer. CT data was obtained and used for attenuation correction and anatomic localization. FASTING BLOOD GLUCOSE:  Value: 126 mg/dl COMPARISON:  08/19/2015 FINDINGS: HEAD AND NECK No abnormal radiotracer accumulation identified  within the brain. A small amount of gas is noted within the anterior horn of the left lateral ventricle. No hypermetabolic lymph nodes in the neck. CHEST Hypermetabolic posterior mediastinal soft tissue at the level of the thigh for ppm attic hiatus is identified. This measures 2 x 2.5 cm and has an SUV max equal to 6.9. No enlarged or hypermetabolic mediastinal or hilar lymph nodes. No hypermetabolic axillary or supraclavicular adenopathy. No suspicious pulmonary nodules identified on the CT images. Calcified granuloma is identified within the posterior left upper lobe. ABDOMEN/PELVIS No abnormal hypermetabolic  activity within the liver, pancreas, adrenal glands, or spleen. Prominent retroperitoneal lymph nodes are identified. Index pre caval node just above the aortic bifurcation measures 1.3 cm and has an SUV max equal to 3.50. Increased presacral soft tissue is identified which exhibits mild increased uptake. This measures 1.x 5.1 x 4.0 cm. The SUV max within this area is equal to 4.13. SKELETON Diffuse heterogeneous tracer activity is identified throughout the bone marrow of the axial and proximal appendicular skeleton. More focal areas of intense uptake are noted within the thoracic spine, lumbar spine and pelvis. For example cuff within the L3 vertebra there is an area of increased uptake which has an SUV max equal to 6.38. IMPRESSION: 1. Abnormal soft tissue within the posterior mediastinum exhibits malignant range FDG uptake and is compatible with clinical history of lymphoma. 2. There are a few enlarged retroperitoneal lymph nodes within the lower abdomen which exhibits nonspecific FDG uptake. Additionally, there is mild increased presacral soft tissue exhibiting low level FDG uptake. 3. Diffuse heterogeneous uptake throughout the bone marrow is noted and is worrisome for lymphomatous involvement. Electronically Signed   By: Kerby Moors M.D.   On: 08/27/2015 11:50   Ir Fluoro Guide Cv Line Right  08/26/2015  CLINICAL DATA:  ACCESS FOR CHEMOTHERAPY, BURKITT'S LYMPHOMA EXAM: RIGHT INTERNAL JUGULAR SINGLE LUMEN POWER PORT CATHETER INSERTION Date:  10/17/201610/17/2016 11:08 am Radiologist:  M. Daryll Brod, MD Guidance:  Ultrasound and fluoroscopic FLUOROSCOPY TIME:  30 seconds, 3 mGy MEDICATIONS AND MEDICAL HISTORY: 2 g Ancefadministered within 1 hour of the procedure.3 mg Versed, 50 mcg fentanyl ANESTHESIA/SEDATION: 30 minutes CONTRAST:  None. COMPLICATIONS: None immediate PROCEDURE: Informed consent was obtained from the patient following explanation of the procedure, risks, benefits and alternatives. The  patient understands, agrees and consents for the procedure. All questions were addressed. A time out was performed. Maximal barrier sterile technique utilized including caps, mask, sterile gowns, sterile gloves, large sterile drape, hand hygiene, and 2% chlorhexidine scrub. Under sterile conditions and local anesthesia, right internal jugular micropuncture venous access was performed. Access was performed with ultrasound. Images were obtained for documentation. A guide wire was inserted followed by a transitional dilator. This allowed insertion of a guide wire and catheter into the IVC. Measurements were obtained from the SVC / RA junction back to the right IJ venotomy site. In the right infraclavicular chest, a subcutaneous pocket was created over the second anterior rib. This was done under sterile conditions and local anesthesia. 1% lidocaine with epinephrine was utilized for this. A 2.5 cm incision was made in the skin. Blunt dissection was performed to create a subcutaneous pocket over the right pectoralis major muscle. The pocket was flushed with saline vigorously. There was adequate hemostasis. The port catheter was assembled and checked for leakage. The port catheter was secured in the pocket with two retention sutures. The tubing was tunneled subcutaneously to the right venotomy site and inserted into the SVC/RA  junction through a valved peel-away sheath. Position was confirmed with fluoroscopy. Images were obtained for documentation. The patient tolerated the procedure well. No immediate complications. Incisions were closed in a two layer fashion with 4 - 0 Vicryl suture. Dermabond was applied to the skin. The port catheter was accessed, blood was aspirated followed by saline and heparin flushes. Needle was removed. A dry sterile dressing was applied. IMPRESSION: Ultrasound and fluoroscopically guided right internal jugular single lumen power port catheter insertion. Tip in the SVC/RA junction. Catheter  ready for use. Electronically Signed   By: Jerilynn Mages.  Shick M.D.   On: 08/26/2015 11:44   Ir US Guide Vasc Access Right  08/26/2015  CLINICAL DATA:  ACCESS FOR CHEMOTHERAPY, BURKITT'S LYMPHOMA EXAM: RIGHT INTERNAL JUGULAR SINGLE LUMEN POWER PORT CATHETER INSERTION Date:  10/17/201610/17/2016 11:08 am Radiologist:  M. Daryll Brod, MD Guidance:  Ultrasound and fluoroscopic FLUOROSCOPY TIME:  30 seconds, 3 mGy MEDICATIONS AND MEDICAL HISTORY: 2 g Ancefadministered within 1 hour of the procedure.3 mg Versed, 50 mcg fentanyl ANESTHESIA/SEDATION: 30 minutes CONTRAST:  None. COMPLICATIONS: None immediate PROCEDURE: Informed consent was obtained from the patient following explanation of the procedure, risks, benefits and alternatives. The patient understands, agrees and consents for the procedure. All questions were addressed. A time out was performed. Maximal barrier sterile technique utilized including caps, mask, sterile gowns, sterile gloves, large sterile drape, hand hygiene, and 2% chlorhexidine scrub. Under sterile conditions and local anesthesia, right internal jugular micropuncture venous access was performed. Access was performed with ultrasound. Images were obtained for documentation. A guide wire was inserted followed by a transitional dilator. This allowed insertion of a guide wire and catheter into the IVC. Measurements were obtained from the SVC / RA junction back to the right IJ venotomy site. In the right infraclavicular chest, a subcutaneous pocket was created over the second anterior rib. This was done under sterile conditions and local anesthesia. 1% lidocaine with epinephrine was utilized for this. A 2.5 cm incision was made in the skin. Blunt dissection was performed to create a subcutaneous pocket over the right pectoralis major muscle. The pocket was flushed with saline vigorously. There was adequate hemostasis. The port catheter was assembled and checked for leakage. The port catheter was secured in  the pocket with two retention sutures. The tubing was tunneled subcutaneously to the right venotomy site and inserted into the SVC/RA junction through a valved peel-away sheath. Position was confirmed with fluoroscopy. Images were obtained for documentation. The patient tolerated the procedure well. No immediate complications. Incisions were closed in a two layer fashion with 4 - 0 Vicryl suture. Dermabond was applied to the skin. The port catheter was accessed, blood was aspirated followed by saline and heparin flushes. Needle was removed. A dry sterile dressing was applied. IMPRESSION: Ultrasound and fluoroscopically guided right internal jugular single lumen power port catheter insertion. Tip in the SVC/RA junction. Catheter ready for use. Electronically Signed   By: Jerilynn Mages.  Shick M.D.   On: 08/26/2015 11:44   Ct Biopsy  08/21/2015  CLINICAL DATA:  Suspected lymphoma EXAM: CT-GUIDED BIOPSY BONE MARROW AND PARA-AORTIC LYMPH NODE. MEDICATIONS AND MEDICAL HISTORY: Versed 2 mg, Fentanyl 100 mcg. Additional Medications: None. ANESTHESIA/SEDATION: Moderate sedation time: Third minutes PROCEDURE: The procedure, risks, benefits, and alternatives were explained to the patient. Questions regarding the procedure were encouraged and answered. The patient understands and consents to the procedure. The back was prepped with Betadine in a sterile fashion, and a sterile drape was applied covering the operative field.  A sterile gown and sterile gloves were used for the procedure. Under CT guidance, an 11 gauge needle was inserted into the right iliac bone via posterior approach. Aspirates and a core were obtained Under CT guidance, an 17 gauge needle was inserted into the para caval lymph node via right posterior lateral approach. Four 18 gauge core biopsies were obtained. The guide needle was removed. Patient tolerated the procedure well without complication. Vital sign monitoring by nursing staff during the procedure will  continue as patient is in the special procedures unit for post procedure observation. FINDINGS: The images document guide needle placement within the right iliac bone and pericaval lymph node. Post biopsy images demonstrate no hemorrhage. COMPLICATIONS: None IMPRESSION: Successful CT-guided bone marrow aspirate, bone marrow core, and pericaval lymph node core which was placed in saline for lymphoma flow analysis. Electronically Signed   By: Marybelle Killings M.D.   On: 08/21/2015 14:10   Ct Biopsy  08/21/2015  CLINICAL DATA:  Suspected lymphoma EXAM: CT-GUIDED BIOPSY BONE MARROW AND PARA-AORTIC LYMPH NODE. MEDICATIONS AND MEDICAL HISTORY: Versed 2 mg, Fentanyl 100 mcg. Additional Medications: None. ANESTHESIA/SEDATION: Moderate sedation time: Third minutes PROCEDURE: The procedure, risks, benefits, and alternatives were explained to the patient. Questions regarding the procedure were encouraged and answered. The patient understands and consents to the procedure. The back was prepped with Betadine in a sterile fashion, and a sterile drape was applied covering the operative field. A sterile gown and sterile gloves were used for the procedure. Under CT guidance, an 11 gauge needle was inserted into the right iliac bone via posterior approach. Aspirates and a core were obtained Under CT guidance, an 17 gauge needle was inserted into the para caval lymph node via right posterior lateral approach. Four 18 gauge core biopsies were obtained. The guide needle was removed. Patient tolerated the procedure well without complication. Vital sign monitoring by nursing staff during the procedure will continue as patient is in the special procedures unit for post procedure observation. FINDINGS: The images document guide needle placement within the right iliac bone and pericaval lymph node. Post biopsy images demonstrate no hemorrhage. COMPLICATIONS: None IMPRESSION: Successful CT-guided bone marrow aspirate, bone marrow core, and  pericaval lymph node core which was placed in saline for lymphoma flow analysis. Electronically Signed   By: Marybelle Killings M.D.   On: 08/21/2015 14:10   Dg Abd Acute W/chest  08/18/2015  CLINICAL DATA:  Chest pain and abdominal pain. EXAM: DG ABDOMEN ACUTE W/ 1V CHEST COMPARISON:  Chest x-ray dated 12/12/2012 performed at [facility]: Heart size and pulmonary vascularity are normal and the lungs are clear. Surgical clips at the gastroesophageal junction. Slight thoracolumbar scoliosis. No free air or free fluid in the abdomen. Bowel gas pattern is normal. Surgical clips and staples in the abdomen. Multiple phleboliths in the pelvis. No acute osseous abnormality. IMPRESSION: Negative abdominal radiographs.  No acute cardiopulmonary disease. Electronically Signed   By: Lorriane Shire M.D.   On: 08/18/2015 17:17   Dg Fluoro Guide Lumbar Puncture  08/26/2015  CLINICAL DATA:  Diagnostic lumbar puncture with intrathecal chemotherapy injection EXAM: FLUOROSCOPICALLY GUIDED LUMBAR PUNCTURE FOR INTRATHECAL CHEMOTHERAPY FLUOROSCOPY TIME:  dictate in minutes and seconds Radiation Exposure Index (as provided by the fluoroscopic device): Not available on this device If the device does not provide the exposure index: Fluoroscopy Time (in minutes and seconds):  1 minutes 32 seconds Number of Acquired Images:  1 PROCEDURE: Informed consent was obtained from the patient prior to the  procedure, including potential complications of headache, allergy, and pain. With the patient prone, the lower back was prepped with Betadine. 1% Lidocaine was used for local anesthesia. Lumbar puncture was performed at the L3-4 level without success, likely due to ligamentous ossification. After numbing, attempt was then made at the L2-3 level, with dry tap despite good needle placement. After numbing, needle placed at the L5-S1 level with easy access to somewhat low pressure CSF (patient NPO for port placement this am). A 20 gauge  needle was used. 11 cc of clear CSF were obtained for laboratory studies. The entire syringe of laboratory prepared methotrexate was then injected into the subarachnoid space. The patient tolerated the procedure well without apparent complication. IMPRESSION: Diagnostic lumbar puncture and intrathecal injection of chemotherapy without complication. Electronically Signed   By: Monte Fantasia M.D.   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 Patient has significant type B constitutional symptoms.  MRI Brain with evidence of leptomeningeal involvement consistent with the patients symptoms of chin numbnes though LP was unrevealing. BM packed with lymphoma/leukemia. LDH down from 3000's to 1900 to 1215 to 500's to 368  2) Chin numbness due to possible leptomeningeal involvement. Notes headaches on and off with no other overt focal neurological deficits. Patient notes headaches have resolved. 3) Thrombocytopenia likely related to lymphoma. Platelets 115k after transfusion yesterday. 4) Leukoerythroblastic picture likely from bone marrow Lymphoma involvement. 4) Abdominal fatigue/back pain - likely from retroperitoneal LNadenopathy - back pain improved. 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 -will order 2nd dose IT Methotrexate on Friday -Neulasta in clinic on Saturday. -continue on Allopurinol for TLS prophylaxis given high risk of Tumor lysis syndrome with daily tumor lysis labs (cmp, uric acid, phosphorus after treatment started) -reduced dexamethasone to  4mg  IV q8h for now with GI prophylaxis.  -continue PT and encourage frequent ambulation. -home with Home care services likely Saturday AM and then Sat infusion center neulasta shot. -weekly IT methotrexate   Sullivan Lone MD MS AAHIVMS [facility] Hematology/Oncology Physician [facility]  (Office):       [phone] (Work cell):  [phone] (Fax):           [phone]

## 2015-08-28 NOTE — Progress Notes (Signed)
Occupational Therapy Treatment Patient Details Name: Kristen Baker MRN: [id number] DOB: Dec 06, 1932 Today's Date: 08/28/2015    History of present illness Patient is a 81 y/o female presens with 1 month history of progressive nausea, vomiting, night sweats, weight loss, lower abdominal pain, headache, chin numbness, and easy brusing. Labs were notable for platelet count of 34; CT of her abdomen and pelvis showed retroperitoneal and lower posterior mediastinal lymphadenopathy suggestive of lymphoma, and a subtle increased sacral epidural soft tissue that cannot be excluded as a spinal metastasis. PMH of cervical ca. Awaiting peripheral blood flow cytometry, Bone Marrow and LN biopsy results 10/13.  Pt s/p port-a-cath placement and started chemotherapy 08/26/15.   OT comments  Pt is making good progress with OT.  She is very motivated and wanted to walk after performing toilet, shower transfers and retrieving items.  Follow Up Recommendations  Home health OT;Supervision - Intermittent    Equipment Recommendations   (3:1 was delivered to room; family got pt a shower seat)    Recommendations for Other Services      Precautions / Restrictions Precautions Precautions: Fall Precaution Comments: chemo Restrictions Weight Bearing Restrictions: No       Mobility Bed Mobility Overal bed mobility: Modified Independent                Transfers Overall transfer level: Modified independent                    Balance                                   ADL                           Toilet Transfer: Supervision/safety;Regular Toilet;RW;Ambulation       Tub/ Shower Transfer: Walk-in shower;supervision;Ambulation; cues for sequence/safety     General ADL Comments: worked on reaching for and safely retrieving items in room.  Backed into shower using RW for support as pt does not have grab bars. Recommended that pt shower when family present.  Also  educated on rest breaks as pt pushes through activities and is fatiqued after.   Pt tends to move quickly but did not have LOB nor bump into anything.  Therapist managed IV pole. Pt ambulated in hall afterwards, at her request      Vision                     Perception     Praxis      Cognition   Behavior During Therapy: [facility] for tasks assessed/performed Overall Cognitive Status: Within Functional Limits for tasks assessed                       Extremity/Trunk Assessment               Exercises     Shoulder Instructions       General Comments      Pertinent Vitals/ Pain       Pain Assessment: No/denies pain  Home Living                                          Prior Functioning/Environment  Frequency Min 2X/week     Progress Toward Goals  OT Goals(current goals can now be found in the care plan section)  Progress towards OT goals: Progressing toward goals     Plan      Co-evaluation                 End of Session     Activity Tolerance Patient tolerated treatment well   Patient Left in bed;with call bell/phone within reach;with family/visitor present   Nurse Communication          Time: 0051-1021 OT Time Calculation (min): 18 min  Charges: OT General Charges $OT Visit: 1 Procedure OT Treatments $Self Care/Home Management : 8-22 mins  Osiris Odriscoll 08/28/2015, 4:03 PM   Lesle Chris, OTR/L [phone] 08/28/2015

## 2015-08-28 NOTE — Progress Notes (Signed)
Dosage and calculations checked with [place].

## 2015-08-28 NOTE — Progress Notes (Signed)
PROGRESS NOTE  Kristen Baker XBW:[id number] DOB: 09-07-1933 DOA: 08/18/2015 PCP: No primary care provider on file.  HPI/Recap of past 33 hours: 81 year old female with past medical history of GERD and B-12 deficiency in 2-3 months of weight loss as well as previous cervical cancer admitted 10/9 for abdominal pain and with workup found to have retroperitoneal lymphadenopathy, thrombocytopenia. Bone marrow and lymph nodes biopsied noting Burkitt's lymphoma. Patient started on chemotherapy.  Today patient doing okay. Feels somewhat weak, but was able to ambulate earlier.  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 Replaced.    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 Started on allopurinol, IV dexamethasone to prevent tumor lysis as well as chemotherapy. Patient will call 1 transfusion of platelets done 10/17.    AKI (acute kidney injury) ([place]): secondary dehydration. With IV fluids, normal     Code Status: Full code  Family Communication: Left message with family  Disposition Plan: Potential discharge tomorrow   Consultants:  Oncology  Interventional radiology  Procedures:  Status post transfusion of platelets 10/17  Status post core needle biopsy done 10/12 noting lymphoma  Placement of Port-A-Cath  Antibiotics:  IV Ancef 1 dose 10/17   Objective: BP 126/50 mmHg  Pulse 75  Temp(Src) 98 F (36.7 C) (Oral)  Resp 18  Ht 5' 7.2" (1.707 m)  Wt 79.516 kg (175 lb 4.8 oz)  BMI 27.29 kg/m2  SpO2 96%  Intake/Output Summary (Last 24 hours) at 08/28/15  1604 Last data filed at 08/28/15 1422  Gross per 24 hour  Intake   1150 ml  Output   1750 ml  Net   -600 ml   Filed Weights   08/24/15 0546 08/24/15 1543 08/26/15 0423  Weight: 79.833 kg (176 lb) 80.468 kg (177 lb 6.4 oz) 79.516 kg (175 lb 4.8 oz)    Exam:   General:  Alert and oriented 3, fatigue  Cardiovascular: Regular rate and rhythm, S1 and S2  Respiratory: Clear to auscultation bilaterally  Abdomen: Soft, nontender, distended, hypoactive bowel sounds  Musculoskeletal: No clubbing or cyanosis, trace edema   Data Reviewed: Basic Metabolic Panel:  Recent Labs Lab 08/22/15 1630 08/24/15 0515 08/26/15 0426 08/27/15 0426 08/27/15 1215 08/28/15 0420  NA  --  136 135 136 135 135  136  K  --  4.6 4.7 4.7 4.7 4.6  4.7  CL  --  98* 101 103 102 104  105  CO2  --  28 27 26 27 25  26   GLUCOSE  --  167* 158* 159* 132* 144*  144*  BUN  --  19 25* 24* 23* 22*  22*  CREATININE  --  0.75 0.74 0.68 0.67 0.63  0.62  CALCIUM  --  8.1* 8.2* 8.0* 8.0* 7.8*  7.8*  PHOS 4.0  --  4.3 4.2  --  4.0   Liver Function Tests:  Recent Labs Lab 08/24/15 0515 08/26/15 0426 08/27/15 0426 08/27/15 1215 08/28/15 0420  AST 27 19  --  11* 11*  ALT 22 28  --  20 18  ALKPHOS 67 63  --  70 63  BILITOT 0.4  0.6  --  0.5 0.7  PROT 5.0* 5.2*  --  5.5* 4.6*  ALBUMIN 3.0* 3.1* 3.2* 3.2* 2.8*  2.8*   No results for input(s): LIPASE, AMYLASE in the last 168 hours. No results for input(s): AMMONIA in the last 168 hours. CBC:  Recent Labs Lab 08/22/15 0815 08/24/15 0515 08/26/15 0426 08/27/15 1215 08/28/15 0420  WBC 6.2 2.9* 2.4* 2.8* 2.2*  NEUTROABS  --  1.5*  --  1.9 1.5*  HGB 10.6* 10.0* 9.2* 9.9* 8.4*  HCT 32.2* 29.8* 27.9* 30.1* 25.1*  MCV 86.3 87.1 88.0 87.8 87.8  PLT 47* 38* 115* 135* 123*   Cardiac Enzymes:   No results for input(s): CKTOTAL, CKMB, CKMBINDEX, TROPONINI in the last 168 hours. BNP (last 3 results) No results for input(s): BNP in the last 8760  hours.  ProBNP (last 3 results) No results for input(s): PROBNP in the last 8760 hours.  CBG:  Recent Labs Lab 08/27/15 0925  GLUCAP 126*    Recent Results (from the past 240 hour(s))  CSF culture with Stat gram stain     Status: None   Collection Time: 08/20/15  4:55 PM  Result Value Ref Range Status   Specimen Description CSF  Final   Special Requests NONE  Final   Gram Stain   Final    WBC PRESENT, PREDOMINANTLY MONONUCLEAR NO ORGANISMS SEEN CYTOSPIN    Culture NO GROWTH 3 DAYS  Final   Report Status 08/23/2015 FINAL  Final  Gram stain     Status: None   Collection Time: 08/26/15  2:22 PM  Result Value Ref Range Status   Specimen Description CSF  Final   Special Requests NONE  Final   Gram Stain   Final    NO ORGANISMS SEEN WBC SEEN Gram Stain Report Called to,Read Back By and Verified With: D TORRES AT 1517 ON 10.17.2016 BY NBROOKS    Report Status 08/26/2015 FINAL  Final     Studies: No results found.  Scheduled Meds: . sodium chloride   Intravenous Once  . allopurinol  100 mg Oral BID  . dexamethasone  4 mg Intravenous 3 times per day  . DOXOrubicin/vinCRIStine/etoposide CHEMO IV infusion for Inpatient CI   Intravenous Once  . pantoprazole  40 mg Oral Daily  . polyethylene glycol  17 g Oral Daily  . senna-docusate  2 tablet Oral QHS  . Vitamin D (Ergocalciferol)  50,000 Units Oral Q7 days    Continuous Infusions: . sodium chloride 20 mL/hr at 08/28/15 1037     Time spent: 15 minutes  [facility] Pager [phone]. If 7PM-7AM, please contact night-coverage at [email or web address], password [facility] 08/28/2015, 4:04 PM  LOS: 8 days

## 2015-08-28 NOTE — Progress Notes (Signed)
[name] Kitchen   HEMATOLOGY/ONCOLOGY INPATIENT PROGRESS NOTE  Date of Service: 08/28/2015  Inpatient Attending: .Annita Brod, MD   SUBJECTIVE Patient notes she is doing well overall. No acute new concerns. Had some nausea last night that resolved with phenergan. Tolerated LP with IT MTX without any issues though she did need a couple of attempts. Had her PET/CT which was reviewed. No other acute new symptoms. Son at bedside.   OBJECTIVE:  PHYSICAL EXAMINATION: . Filed Vitals:   08/27/15 0600 08/27/15 1300 08/27/15 2045   BP: 107/55 139/67 118/49   Pulse: 61 68 66   Temp: 98.1 F (36.7 C) 98.2 F (36.8 C) 98 F (36.7 C)   TempSrc: Oral Oral Oral   Resp: _0 Height:      Weight:      SpO2: 97% 94% 96%    Filed Weights   08/24/15 0546 08/24/15 1543 08/26/15 0423  Weight: 176 lb (79.833 kg) 177 lb 6.4 oz (80.468 kg) 175 lb 4.8 oz (79.516 kg)   .Body mass index is 27.29 kg/(m^2).   GENERAL:alert, in no acute distress and comfortable SKIN: skin color, texture, turgor are normal, no rashes or significant lesions EYES: normal, conjunctiva are pink and non-injected, sclera clear OROPHARYNX:no exudate, no erythema and lips, buccal mucosa, and tongue normal  NECK: supple, no JVD, thyroid normal size, non-tender, without nodularity LYMPH: no palpable lymphadenopathy in the cervical, axillary or inguinal LUNGS: clear to auscultation with normal respiratory effort HEART: regular rate & rhythm, no murmurs and no lower extremity edema ABDOMEN: abdomen soft, non-tender, normoactive bowel sounds  Musculoskeletal: no cyanosis of digits and no clubbing  PSYCH: alert & oriented x 3 with fluent speech NEURO: no focal motor/sensory deficits.  MEDICAL HISTORY:  Past Medical History  Diagnosis Date  . Cancer [facility])     cervical cancer    SURGICAL HISTORY: Past Surgical History  Procedure Laterality Date  . Cholecystectomy    . Tonsillectomy    . Abdominal hysterectomy      . Abdominal surgery      2-3 rd of colon removed    SOCIAL HISTORY: Social History   Social History  . Marital Status: Married    Spouse Name: N/A  . Number of Children: N/A  . Years of Education: N/A   Occupational History  . Not on file.   Social History Main Topics  . Smoking status: Never Smoker   . Smokeless tobacco: Not on file  . Alcohol Use: No  . Drug Use: No  . Sexual Activity: Not on file   Other Topics Concern  . Not on file   Social History Narrative    FAMILY HISTORY: History reviewed. No pertinent family history.  ALLERGIES:  is allergic to cabbage; onion; shellfish allergy; sulfa antibiotics; and zofran.  MEDICATIONS:  Scheduled Meds: . sodium chloride   Intravenous Once  . allopurinol  100 mg Oral BID  . dexamethasone  4 mg Intravenous 3 times per day  . DOXOrubicin/vinCRIStine/etoposide CHEMO IV infusion for Inpatient CI   Intravenous Once  . pantoprazole  40 mg Oral Daily  . polyethylene glycol  17 g Oral Daily  . senna-docusate  2 tablet Oral QHS  . Vitamin D (Ergocalciferol)  50,000 Units Oral Q7 days   Continuous Infusions: . sodium chloride    . sodium chloride 50 mL/hr at 08/27/15 1650   PRN Meds:.alteplase, Cold Pack, fludeoxyglucose F - 18, heparin lock flush, heparin lock flush, Hot Pack, iohexol, promethazine,  sodium chloride, sodium chloride, traMADol  REVIEW OF SYSTEMS:    10 Point review of Systems was done is negative except as noted above.   LABORATORY DATA:  I have reviewed the data as listed  Component     Latest Ref Rng 08/27/2015  WBC     4.0 - 10.5 K/uL 2.8 (L)  RBC     3.87 - 5.11 MIL/uL 3.43 (L)  Hemoglobin     12.0 - 15.0 g/dL 9.9 (L)  HCT     36.0 - 46.0 % 30.1 (L)  MCV     78.0 - 100.0 fL 87.8  MCH     26.0 - 34.0 pg 28.9  MCHC     30.0 - 36.0 g/dL 32.9  RDW     11.5 - 15.5 % 16.5 (H)  Platelets     150 - 400 K/uL 135 (L)  Neutrophils      68  NEUT#     1.7 - 7.7 K/uL 1.9  Lymphocytes       14  Lymphocyte #     0.7 - 4.0 K/uL 0.4 (L)  Monocytes Relative      18  Monocyte #     0.1 - 1.0 K/uL 0.5  Eosinophil      0  Eosinophils Absolute     0.0 - 0.7 K/uL 0.0  Basophil      0  Basophils Absolute     0.0 - 0.1 K/uL 0.0  Sodium     135 - 145 mmol/L 135  Potassium     3.5 - 5.1 mmol/L 4.7  Chloride     101 - 111 mmol/L 102  CO2     22 - 32 mmol/L 27  Glucose     65 - 99 mg/dL 132 (H)  BUN     6 - 20 mg/dL 23 (H)  Creatinine     0.44 - 1.00 mg/dL 0.67  Calcium     8.9 - 10.3 mg/dL 8.0 (L)  Total Protein     6.5 - 8.1 g/dL 5.5 (L)  Albumin     3.5 - 5.0 g/dL 3.2 (L)  AST     15 - 41 U/L 11 (L)  ALT     14 - 54 U/L 20  Alkaline Phosphatase     38 - 126 U/L 70  Total Bilirubin     0.3 - 1.2 mg/dL 0.5  EGFR (Non-African Amer.)     >60 mL/min >60  EGFR (African American)     >60 mL/min >60  Anion gap     5 - 15 6  Uric Acid, Serum     2.3 - 6.6 mg/dL 3.6  Vit D, 25-Hydroxy     30.0 - 100.0 ng/mL 27.7 (L)   RADIOGRAPHIC STUDIES: I have personally reviewed the radiological images as listed and agreed with the findings in the report. Ct Chest Wo Contrast  08/19/2015  CLINICAL DATA:  81 year old female with abnormal CT Abdomen and Pelvis, possible lymphoma. Initial encounter. EXAM: CT CHEST WITHOUT CONTRAST TECHNIQUE: Multidetector CT imaging of the chest was performed following the standard protocol without IV contrast. COMPARISON:  CT Abdomen and Pelvis 08/18/2015. Chest and abdominal radiographs 08/18/2015. FINDINGS: Noncontrast exam. Stable abnormal inferior posterior mediastinum and retrocrural soft tissue as described recently. No pericardial effusion. No pleural effusion. Mediastinal and axillary lymph nodes are normal. Thoracic inlet lymph nodes are normal. Calcified aortic and coronary artery atherosclerosis. Major airways are patent. There are occasional  calcified granulomas. There is right upper lobe bronchiectasis with mild patchy peribronchial  opacity along the major fissure (series 305, image 16). Mild tree-in-bud nodular opacity along the inferior lateral right upper lobe near the minor fissure (image 24). Minor dependent atelectasis. Degenerative changes in the thoracic spine. No acute or suspicious osseous lesion in the chest. IMPRESSION: 1. Abnormal posterior mediastinal/retrocrural soft tissue as seen on the recent CT Abdomen and Pelvis. Mediastinal, axillary, and thoracic inlet lymph nodes are normal. 2. Right upper lobe bronchiectasis and peripheral / peribronchial opacity compatible with distal airway infection or less likely scarring. Electronically Signed   By: Genevie Ann M.D.   On: 08/19/2015 23:39   [name] [place] Contrast  08/19/2015  CLINICAL DATA:  Headache.  Possible lymphoma. EXAM: MRI HEAD WITHOUT AND WITH CONTRAST TECHNIQUE: Multiplanar, multiecho pulse sequences of the brain and surrounding structures were obtained without and with intravenous contrast. CONTRAST:  82m MULTIHANCE GADOBENATE DIMEGLUMINE 529 MG/ML IV SOLN COMPARISON:  None. FINDINGS: Ventricle size normal. Cerebral volume normal. Pituitary normal in size. Craniocervical junction normal. Negative for acute infarct. Minimal hyperintensity in the periventricular white matter may represent chronic ischemia. Negative for hemorrhage or fluid collection. Negative for edema in the brain Postcontrast imaging reveals leptomeningeal enhancement diffusely and bilaterally. There is mild smooth enhancement of the meninges without nodularity. No enhancing mass lesion in the brain. IMPRESSION: Diffuse leptomeningeal enhancement. Given the history, this is concerning for lymphomatous involvement. Lumbar puncture with cytology recommended. Negative for acute infarct or mass lesion. Electronically Signed   By: CFranchot GalloM.D.   On: 08/19/2015 20:47   Ct Abdomen Pelvis W Contrast  08/18/2015  CLINICAL DATA:  81year old female with lower abdominal pain nausea and vomiting for 1  month. Initial encounter. EXAM: CT ABDOMEN AND PELVIS WITH CONTRAST TECHNIQUE: Multidetector CT imaging of the abdomen and pelvis was performed using the standard protocol following bolus administration of intravenous contrast. CONTRAST:  221mOMNIPAQUE IOHEXOL 300 MG/ML SOLN, 10023mMNIPAQUE IOHEXOL 300 MG/ML SOLN [facility] Acute abdominal series from today FINDINGS: Mild respiratory motion artifact at the lung bases. There are chronic surgical clips about the distal thoracic esophagus and gastroesophageal junction. New since 2014 in the posterior mediastinum and tracking toward the retrocrural space is abnormal confluent soft tissue anterior to the thoracic spine and inseparable from the medial wall of the descending thoracic aorta measuring 3 x 44 x 54 mm (AP by transverse by CC). This seems to be separate from both the aorta and esophagus. There is no anterior thoracic spine erosion. Calcified plaque along this segment of the aorta. No associated pericardial or pleural effusion. Mild bronchiectasis at both lung bases with no confluent pulmonary opacity. Degenerative changes in the spine. Chronic 10 mm lucent area in the left T12 vertebral body is stable and most resembled hemangioma in 2014. No acute or suspicious osseous lesion is identified however, there is subtle increased ventral epidural soft tissue seen in the sacrum -sagittal image 73. Mild to moderate nonspecific presacral stranding. No pelvic free fluid. Negative rectum with retained stool. Uterus surgically absent. Adnexa within normal limits. Numerous pelvic phleboliths. Negative urinary bladder. Redundant sigmoid colon. Proximal sigmoid and left colon diverticulosis with no active inflammation identified. Negative transverse colon. Negative right colon. Ileocecal valve lipoma incidentally noted. Appendix diminutive or absent. Negative terminal ileum. No dilated or abnormal small bowel loops. Occasional  surgical clips in the greater omentum. Diminutive stomach. Duodenum within normal limits. Major arterial structures  are patent in the abdomen and pelvis with fairly extensive calcified aortic atherosclerosis. Portal venous system appears to be patent. Retroperitoneal lymphadenopathy maximal at the lower lumbar spine level anterior to the IVC measuring up to 22 mm short axis. Numerous increased para renal and other retroperitoneal space nodes are individually up to 14 mm short axis. Mesenteric nodes in the abdomen and pelvis have a more normal appearance. No pelvic sidewall or inguinal lymphadenopathy identified. Solitary small nonspecific low-density area in the right hepatic lobe measuring 10 mm on series 2, image 20, favor benign. Gallbladder not identified and felt to be surgically absent. No splenomegaly or splenic lesion. Negative pancreas and adrenal glands. Bilateral renal enhancement and contrast excretion within normal limits. No abdominal free fluid. IMPRESSION: 1. Retroperitoneal and lower posterior mediastinal / retrocrural soft tissue masses most compatible with lymphadenopathy, and most suggestive of Lymphoma. Some of these might be amenable to CT-guided biopsy, uncertain. 2. Subtle increased sacral epidural soft tissue, but no destructive osseous lesion identified. Nonspecific presacral stranding. Metastatic disease to the spine not excluded. Electronically Signed   By: Genevie Ann M.D.   On: 08/18/2015 18:52   Nm Pet Image Initial (pi) Skull Base To Thigh  08/27/2015  CLINICAL DATA:  Initial treatment strategy for lymphoma. EXAM: NUCLEAR MEDICINE PET VERTEX TO THIGH TECHNIQUE: 8.75 mCi F-18 FDG was injected intravenously. Full-ring PET imaging was performed from the vertex to thigh after the radiotracer. CT data was obtained and used for attenuation correction and anatomic localization. FASTING BLOOD GLUCOSE:  Value: 126 mg/dl COMPARISON:  08/19/2015 FINDINGS: HEAD AND NECK No abnormal radiotracer  accumulation identified within the brain. A small amount of gas is noted within the anterior horn of the left lateral ventricle. No hypermetabolic lymph nodes in the neck. CHEST Hypermetabolic posterior mediastinal soft tissue at the level of the thigh for ppm attic hiatus is identified. This measures 2 x 2.5 cm and has an SUV max equal to 6.9. No enlarged or hypermetabolic mediastinal or hilar lymph nodes. No hypermetabolic axillary or supraclavicular adenopathy. No suspicious pulmonary nodules identified on the CT images. Calcified granuloma is identified within the posterior left upper lobe. ABDOMEN/PELVIS No abnormal hypermetabolic activity within the liver, pancreas, adrenal glands, or spleen. Prominent retroperitoneal lymph nodes are identified. Index pre caval node just above the aortic bifurcation measures 1.3 cm and has an SUV max equal to 3.50. Increased presacral soft tissue is identified which exhibits mild increased uptake. This measures 1.x 5.1 x 4.0 cm. The SUV max within this area is equal to 4.13. SKELETON Diffuse heterogeneous tracer activity is identified throughout the bone marrow of the axial and proximal appendicular skeleton. More focal areas of intense uptake are noted within the thoracic spine, lumbar spine and pelvis. For example cuff within the L3 vertebra there is an area of increased uptake which has an SUV max equal to 6.38. IMPRESSION: 1. Abnormal soft tissue within the posterior mediastinum exhibits malignant range FDG uptake and is compatible with clinical history of lymphoma. 2. There are a few enlarged retroperitoneal lymph nodes within the lower abdomen which exhibits nonspecific FDG uptake. Additionally, there is mild increased presacral soft tissue exhibiting low level FDG uptake. 3. Diffuse heterogeneous uptake throughout the bone marrow is noted and is worrisome for lymphomatous involvement. Electronically Signed   By: Kerby Moors M.D.   On: 08/27/2015 11:50   Ir Fluoro  Guide Cv Line Right  08/26/2015  CLINICAL DATA:  ACCESS FOR CHEMOTHERAPY, BURKITT'S LYMPHOMA EXAM: RIGHT INTERNAL JUGULAR  SINGLE LUMEN POWER PORT CATHETER INSERTION Date:  10/17/201610/17/2016 11:08 am Radiologist:  M. Daryll Brod, MD Guidance:  Ultrasound and fluoroscopic FLUOROSCOPY TIME:  30 seconds, 3 mGy MEDICATIONS AND MEDICAL HISTORY: 2 g Ancefadministered within 1 hour of the procedure.3 mg Versed, 50 mcg fentanyl ANESTHESIA/SEDATION: 30 minutes CONTRAST:  None. COMPLICATIONS: None immediate PROCEDURE: Informed consent was obtained from the patient following explanation of the procedure, risks, benefits and alternatives. The patient understands, agrees and consents for the procedure. All questions were addressed. A time out was performed. Maximal barrier sterile technique utilized including caps, mask, sterile gowns, sterile gloves, large sterile drape, hand hygiene, and 2% chlorhexidine scrub. Under sterile conditions and local anesthesia, right internal jugular micropuncture venous access was performed. Access was performed with ultrasound. Images were obtained for documentation. A guide wire was inserted followed by a transitional dilator. This allowed insertion of a guide wire and catheter into the IVC. Measurements were obtained from the SVC / RA junction back to the right IJ venotomy site. In the right infraclavicular chest, a subcutaneous pocket was created over the second anterior rib. This was done under sterile conditions and local anesthesia. 1% lidocaine with epinephrine was utilized for this. A 2.5 cm incision was made in the skin. Blunt dissection was performed to create a subcutaneous pocket over the right pectoralis major muscle. The pocket was flushed with saline vigorously. There was adequate hemostasis. The port catheter was assembled and checked for leakage. The port catheter was secured in the pocket with two retention sutures. The tubing was tunneled subcutaneously to the right  venotomy site and inserted into the SVC/RA junction through a valved peel-away sheath. Position was confirmed with fluoroscopy. Images were obtained for documentation. The patient tolerated the procedure well. No immediate complications. Incisions were closed in a two layer fashion with 4 - 0 Vicryl suture. Dermabond was applied to the skin. The port catheter was accessed, blood was aspirated followed by saline and heparin flushes. Needle was removed. A dry sterile dressing was applied. IMPRESSION: Ultrasound and fluoroscopically guided right internal jugular single lumen power port catheter insertion. Tip in the SVC/RA junction. Catheter ready for use. Electronically Signed   By: Jerilynn Mages.  Shick M.D.   On: 08/26/2015 11:44   Ir US Guide Vasc Access Right  08/26/2015  CLINICAL DATA:  ACCESS FOR CHEMOTHERAPY, BURKITT'S LYMPHOMA EXAM: RIGHT INTERNAL JUGULAR SINGLE LUMEN POWER PORT CATHETER INSERTION Date:  10/17/201610/17/2016 11:08 am Radiologist:  M. Daryll Brod, MD Guidance:  Ultrasound and fluoroscopic FLUOROSCOPY TIME:  30 seconds, 3 mGy MEDICATIONS AND MEDICAL HISTORY: 2 g Ancefadministered within 1 hour of the procedure.3 mg Versed, 50 mcg fentanyl ANESTHESIA/SEDATION: 30 minutes CONTRAST:  None. COMPLICATIONS: None immediate PROCEDURE: Informed consent was obtained from the patient following explanation of the procedure, risks, benefits and alternatives. The patient understands, agrees and consents for the procedure. All questions were addressed. A time out was performed. Maximal barrier sterile technique utilized including caps, mask, sterile gowns, sterile gloves, large sterile drape, hand hygiene, and 2% chlorhexidine scrub. Under sterile conditions and local anesthesia, right internal jugular micropuncture venous access was performed. Access was performed with ultrasound. Images were obtained for documentation. A guide wire was inserted followed by a transitional dilator. This allowed insertion of a guide  wire and catheter into the IVC. Measurements were obtained from the SVC / RA junction back to the right IJ venotomy site. In the right infraclavicular chest, a subcutaneous pocket was created over the second anterior rib. This was done under  sterile conditions and local anesthesia. 1% lidocaine with epinephrine was utilized for this. A 2.5 cm incision was made in the skin. Blunt dissection was performed to create a subcutaneous pocket over the right pectoralis major muscle. The pocket was flushed with saline vigorously. There was adequate hemostasis. The port catheter was assembled and checked for leakage. The port catheter was secured in the pocket with two retention sutures. The tubing was tunneled subcutaneously to the right venotomy site and inserted into the SVC/RA junction through a valved peel-away sheath. Position was confirmed with fluoroscopy. Images were obtained for documentation. The patient tolerated the procedure well. No immediate complications. Incisions were closed in a two layer fashion with 4 - 0 Vicryl suture. Dermabond was applied to the skin. The port catheter was accessed, blood was aspirated followed by saline and heparin flushes. Needle was removed. A dry sterile dressing was applied. IMPRESSION: Ultrasound and fluoroscopically guided right internal jugular single lumen power port catheter insertion. Tip in the SVC/RA junction. Catheter ready for use. Electronically Signed   By: Jerilynn Mages.  Shick M.D.   On: 08/26/2015 11:44   Ct Biopsy  08/21/2015  CLINICAL DATA:  Suspected lymphoma EXAM: CT-GUIDED BIOPSY BONE MARROW AND PARA-AORTIC LYMPH NODE. MEDICATIONS AND MEDICAL HISTORY: Versed 2 mg, Fentanyl 100 mcg. Additional Medications: None. ANESTHESIA/SEDATION: Moderate sedation time: Third minutes PROCEDURE: The procedure, risks, benefits, and alternatives were explained to the patient. Questions regarding the procedure were encouraged and answered. The patient understands and consents to the  procedure. The back was prepped with Betadine in a sterile fashion, and a sterile drape was applied covering the operative field. A sterile gown and sterile gloves were used for the procedure. Under CT guidance, an 11 gauge needle was inserted into the right iliac bone via posterior approach. Aspirates and a core were obtained Under CT guidance, an 17 gauge needle was inserted into the para caval lymph node via right posterior lateral approach. Four 18 gauge core biopsies were obtained. The guide needle was removed. Patient tolerated the procedure well without complication. Vital sign monitoring by nursing staff during the procedure will continue as patient is in the special procedures unit for post procedure observation. FINDINGS: The images document guide needle placement within the right iliac bone and pericaval lymph node. Post biopsy images demonstrate no hemorrhage. COMPLICATIONS: None IMPRESSION: Successful CT-guided bone marrow aspirate, bone marrow core, and pericaval lymph node core which was placed in saline for lymphoma flow analysis. Electronically Signed   By: Marybelle Killings M.D.   On: 08/21/2015 14:10   Ct Biopsy  08/21/2015  CLINICAL DATA:  Suspected lymphoma EXAM: CT-GUIDED BIOPSY BONE MARROW AND PARA-AORTIC LYMPH NODE. MEDICATIONS AND MEDICAL HISTORY: Versed 2 mg, Fentanyl 100 mcg. Additional Medications: None. ANESTHESIA/SEDATION: Moderate sedation time: Third minutes PROCEDURE: The procedure, risks, benefits, and alternatives were explained to the patient. Questions regarding the procedure were encouraged and answered. The patient understands and consents to the procedure. The back was prepped with Betadine in a sterile fashion, and a sterile drape was applied covering the operative field. A sterile gown and sterile gloves were used for the procedure. Under CT guidance, an 11 gauge needle was inserted into the right iliac bone via posterior approach. Aspirates and a core were obtained Under CT  guidance, an 17 gauge needle was inserted into the para caval lymph node via right posterior lateral approach. Four 18 gauge core biopsies were obtained. The guide needle was removed. Patient tolerated the procedure well without complication. Vital sign monitoring  by nursing staff during the procedure will continue as patient is in the special procedures unit for post procedure observation. FINDINGS: The images document guide needle placement within the right iliac bone and pericaval lymph node. Post biopsy images demonstrate no hemorrhage. COMPLICATIONS: None IMPRESSION: Successful CT-guided bone marrow aspirate, bone marrow core, and pericaval lymph node core which was placed in saline for lymphoma flow analysis. Electronically Signed   By: Marybelle Killings M.D.   On: 08/21/2015 14:10   Dg Abd Acute W/chest  08/18/2015  CLINICAL DATA:  Chest pain and abdominal pain. EXAM: DG ABDOMEN ACUTE W/ 1V CHEST COMPARISON:  Chest x-ray dated 12/12/2012 performed at [place]: Heart size and pulmonary vascularity are normal and the lungs are clear. Surgical clips at the gastroesophageal junction. Slight thoracolumbar scoliosis. No free air or free fluid in the abdomen. Bowel gas pattern is normal. Surgical clips and staples in the abdomen. Multiple phleboliths in the pelvis. No acute osseous abnormality. IMPRESSION: Negative abdominal radiographs.  No acute cardiopulmonary disease. Electronically Signed   By: Lorriane Shire M.D.   On: 08/18/2015 17:17   Dg Fluoro Guide Lumbar Puncture  08/26/2015  CLINICAL DATA:  Diagnostic lumbar puncture with intrathecal chemotherapy injection EXAM: FLUOROSCOPICALLY GUIDED LUMBAR PUNCTURE FOR INTRATHECAL CHEMOTHERAPY FLUOROSCOPY TIME:  dictate in minutes and seconds Radiation Exposure Index (as provided by the fluoroscopic device): Not available on this device If the device does not provide the exposure index: Fluoroscopy Time (in minutes and seconds):  1 minutes 32  seconds Number of Acquired Images:  1 PROCEDURE: Informed consent was obtained from the patient prior to the procedure, including potential complications of headache, allergy, and pain. With the patient prone, the lower back was prepped with Betadine. 1% Lidocaine was used for local anesthesia. Lumbar puncture was performed at the L3-4 level without success, likely due to ligamentous ossification. After numbing, attempt was then made at the L2-3 level, with dry tap despite good needle placement. After numbing, needle placed at the L5-S1 level with easy access to somewhat low pressure CSF (patient NPO for port placement this am). A 20 gauge needle was used. 11 cc of clear CSF were obtained for laboratory studies. The entire syringe of laboratory prepared methotrexate was then injected into the subarachnoid space. The patient tolerated the procedure well without apparent complication. IMPRESSION: Diagnostic lumbar puncture and intrathecal injection of chemotherapy without complication. Electronically Signed   By: Monte Fantasia M.D.   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 Patient has significant type B constitutional symptoms.  MRI Brain with evidence of leptomeningeal involvement consistent with the patients symptoms of chin numbnes though LP was unrevealing. BM packed with lymphoma/leukemia. LDH down from 3000's to 1900 to 1215 due to steroids. (tolerating steroids well)  2) Chin numbness due to possible leptomeningeal involvement. Notes headaches on and off with no other overt focal neurological deficits. Patient notes headaches have  resolved. 3) Thrombocytopenia likely related to lymphoma. Platelets 115k after transfusion yesterday. 4) Leukoerythroblastic picture likely from bone marrow Lymphoma involvement. 4) Abdominal fatigue/back pain - likely from retroperitoneal LNadenopathy - back pain improved. 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 -continue on Allopurinol for TLS prophylaxis given high risk of Tumor lysis syndrome with daily tumor lysis labs (cmp, uric acid, phosphorus after treatment started) -reduced dexamethasone to 65m IV q8h for now with GI prophylaxis.  -continue PT and encourage frequent ambulation. -Social worker consultation to help with POA paperwork as per daughters request as well as to help with discharge planning .will likely need home care services versus SNF . Family is very supportive . -will arrange for weekly intrathecal MTX.  I spent 30 minutes counseling the patient face to face. The total time spent in the appointment was 35 minutes and more than 50% was on counseling and direct patient cares.    GSullivan LoneMD MHernandezAAHIVMS SBethesda Endoscopy Center LLCCBlue Island Hospital Co LLC Dba Metrosouth Medical CenterHematology/Oncology Physician [facility]Special[facility] (Office):       [phone](Work cell):  [phone](Fax):           [phone] 08/28/2015 7:53 AM

## 2015-08-28 NOTE — Progress Notes (Signed)
Patient complained of pain running across her lower chest, mostly on the right side and up to her right arm.vital signs and ekg done.DR gorsuch oncologist on call notified and she ordered pain med, but patient did not want to take pain med, states her pain is gone, Dr Zollie Scale notified and he ordered troponin, with negative results. Patient denies any shortness of breath, Portacath site negative for swelling , obtained very good blood results. Chemo infusing well. Continue to monitor.

## 2015-08-29 ENCOUNTER — Other Ambulatory Visit: Payer: Self-pay | Admitting: Hematology

## 2015-08-29 ENCOUNTER — Other Ambulatory Visit: Payer: Self-pay | Admitting: *Deleted

## 2015-08-29 ENCOUNTER — Encounter (HOSPITAL_COMMUNITY): Payer: Self-pay

## 2015-08-29 ENCOUNTER — Telehealth: Payer: Self-pay | Admitting: Hematology

## 2015-08-29 DIAGNOSIS — C8378 Burkitt lymphoma, lymph nodes of multiple sites: Secondary | ICD-10-CM

## 2015-08-29 DIAGNOSIS — C8370 Burkitt lymphoma, unspecified site: Secondary | ICD-10-CM

## 2015-08-29 DIAGNOSIS — C7949 Secondary malignant neoplasm of other parts of nervous system: Secondary | ICD-10-CM

## 2015-08-29 DIAGNOSIS — E875 Hyperkalemia: Secondary | ICD-10-CM | POA: Insufficient documentation

## 2015-08-29 DIAGNOSIS — D6489 Other specified anemias: Secondary | ICD-10-CM | POA: Insufficient documentation

## 2015-08-29 HISTORY — DX: Burkitt lymphoma, unspecified site: C83.70

## 2015-08-29 LAB — CBC WITH DIFFERENTIAL/PLATELET
BASOS ABS: 0 10*3/uL (ref 0.0–0.1)
Basophils Relative: 1 %
EOS ABS: 0 10*3/uL (ref 0.0–0.7)
EOS PCT: 0 %
HCT: 25.6 % — ABNORMAL LOW (ref 36.0–46.0)
HEMOGLOBIN: 8.5 g/dL — AB (ref 12.0–15.0)
Lymphocytes Relative: 21 %
Lymphs Abs: 0.4 10*3/uL — ABNORMAL LOW (ref 0.7–4.0)
MCH: 29.4 pg (ref 26.0–34.0)
MCHC: 33.2 g/dL (ref 30.0–36.0)
MCV: 88.6 fL (ref 78.0–100.0)
Monocytes Absolute: 0.1 10*3/uL (ref 0.1–1.0)
Monocytes Relative: 3 %
NEUTROS PCT: 75 %
Neutro Abs: 1.5 10*3/uL — ABNORMAL LOW (ref 1.7–7.7)
Platelets: 129 10*3/uL — ABNORMAL LOW (ref 150–400)
RBC: 2.89 MIL/uL — AB (ref 3.87–5.11)
RDW: 16.4 % — ABNORMAL HIGH (ref 11.5–15.5)
WBC: 2 10*3/uL — AB (ref 4.0–10.5)

## 2015-08-29 LAB — COMPREHENSIVE METABOLIC PANEL
ALBUMIN: 2.8 g/dL — AB (ref 3.5–5.0)
ALK PHOS: 62 U/L (ref 38–126)
ALT: 17 U/L (ref 14–54)
AST: 13 U/L — AB (ref 15–41)
Anion gap: 6 (ref 5–15)
BUN: 21 mg/dL — AB (ref 6–20)
CHLORIDE: 104 mmol/L (ref 101–111)
CO2: 28 mmol/L (ref 22–32)
CREATININE: 0.73 mg/dL (ref 0.44–1.00)
Calcium: 8.1 mg/dL — ABNORMAL LOW (ref 8.9–10.3)
GFR calc Af Amer: >60 mL/min (ref >60)
GFR calc non Af Amer: >60 mL/min (ref >60)
GLUCOSE: 144 mg/dL — AB (ref 65–99)
Potassium: 5.3 mmol/L — ABNORMAL HIGH (ref 3.5–5.1)
SODIUM: 138 mmol/L (ref 135–145)
TOTAL PROTEIN: 4.7 g/dL — AB (ref 6.5–8.1)
Total Bilirubin: 0.8 mg/dL (ref 0.3–1.2)

## 2015-08-29 LAB — RENAL FUNCTION PANEL
ALBUMIN: 2.8 g/dL — AB (ref 3.5–5.0)
Anion gap: 5 (ref 5–15)
BUN: 21 mg/dL — AB (ref 6–20)
CHLORIDE: 103 mmol/L (ref 101–111)
CO2: 29 mmol/L (ref 22–32)
CREATININE: 0.74 mg/dL (ref 0.44–1.00)
Calcium: 8.1 mg/dL — ABNORMAL LOW (ref 8.9–10.3)
Glucose, Bld: 146 mg/dL — ABNORMAL HIGH (ref 65–99)
PHOSPHORUS: 3.8 mg/dL (ref 2.5–4.6)
POTASSIUM: 5.3 mmol/L — AB (ref 3.5–5.1)
Sodium: 137 mmol/L (ref 135–145)

## 2015-08-29 LAB — PREPARE RBC (CROSSMATCH)

## 2015-08-29 LAB — URIC ACID: Uric Acid, Serum: 3.5 mg/dL (ref 2.3–6.6)

## 2015-08-29 LAB — TROPONIN I

## 2015-08-29 LAB — LACTATE DEHYDROGENASE: LDH: 352 U/L — ABNORMAL HIGH (ref 98–192)

## 2015-08-29 MED ORDER — EPINEPHRINE HCL 1 MG/ML IJ SOLN
0.5000 mg | Freq: Once | INTRAMUSCULAR | Status: DC | PRN
  Filled 2015-08-29: qty 1

## 2015-08-29 MED ORDER — SODIUM CHLORIDE 0.9 % IV SOLN
400.0000 mg/m2 | Freq: Once | INTRAVENOUS | Status: AC
  Administered 2015-08-29: 780 mg via INTRAVENOUS
  Filled 2015-08-29: qty 39

## 2015-08-29 MED ORDER — MAGNESIUM CITRATE PO SOLN
1.0000 | Freq: Once | ORAL | Status: AC | PRN
  Administered 2015-08-29: 1 via ORAL
  Filled 2015-08-29: qty 296

## 2015-08-29 MED ORDER — EPINEPHRINE HCL 0.1 MG/ML IJ SOSY
0.2500 mg | PREFILLED_SYRINGE | Freq: Once | INTRAMUSCULAR | Status: DC | PRN
  Filled 2015-08-29: qty 10

## 2015-08-29 MED ORDER — SODIUM CHLORIDE 0.9 % IJ SOLN: 10.0000 mL | INTRAMUSCULAR | Status: DC | PRN

## 2015-08-29 MED ORDER — SODIUM CHLORIDE 0.9 % IV SOLN
375.0000 mg/m2 | Freq: Once | INTRAVENOUS | Status: AC
  Administered 2015-08-29: 700 mg via INTRAVENOUS
  Filled 2015-08-29: qty 50

## 2015-08-29 MED ORDER — FAMOTIDINE IN NACL 20-0.9 MG/50ML-% IV SOLN: 20.0000 mg | Freq: Once | INTRAVENOUS | Status: DC | PRN

## 2015-08-29 MED ORDER — SODIUM CHLORIDE 0.9 % IJ SOLN: 3.0000 mL | INTRAMUSCULAR | Status: DC | PRN

## 2015-08-29 MED ORDER — HEPARIN SOD (PORK) LOCK FLUSH 100 UNIT/ML IV SOLN: 250.0000 [IU] | INTRAVENOUS | Status: DC | PRN

## 2015-08-29 MED ORDER — METHYLPREDNISOLONE SODIUM SUCC 125 MG IJ SOLR: 125.0000 mg | Freq: Once | INTRAMUSCULAR | Status: DC | PRN

## 2015-08-29 MED ORDER — SODIUM CHLORIDE 0.9 % IV SOLN: INTRAVENOUS | Status: AC

## 2015-08-29 MED ORDER — ACETAMINOPHEN 325 MG PO TABS
650.0000 mg | ORAL_TABLET | Freq: Once | ORAL | Status: AC
  Administered 2015-08-29: 650 mg via ORAL
  Filled 2015-08-29: qty 2

## 2015-08-29 MED ORDER — DEXAMETHASONE SODIUM PHOSPHATE 4 MG/ML IJ SOLN
8.0000 mg | Freq: Once | INTRAMUSCULAR | Status: AC
  Administered 2015-08-29: 8 mg via INTRAVENOUS
  Filled 2015-08-29: qty 2

## 2015-08-29 MED ORDER — DIPHENHYDRAMINE HCL 50 MG/ML IJ SOLN: 25.0000 mg | Freq: Once | INTRAMUSCULAR | Status: DC | PRN

## 2015-08-29 MED ORDER — SODIUM CHLORIDE 0.9 % IV SOLN: Freq: Once | INTRAVENOUS | Status: DC

## 2015-08-29 MED ORDER — PALONOSETRON HCL INJECTION 0.25 MG/5ML
0.2500 mg | Freq: Once | INTRAVENOUS | Status: AC
  Administered 2015-08-29: 0.25 mg via INTRAVENOUS
  Filled 2015-08-29: qty 5

## 2015-08-29 MED ORDER — DEXAMETHASONE SODIUM PHOSPHATE 4 MG/ML IJ SOLN
4.0000 mg | Freq: Two times a day (BID) | INTRAMUSCULAR | Status: DC
  Administered 2015-08-29 – 2015-08-31 (×4): 4 mg via INTRAVENOUS
  Filled 2015-08-29 (×4): qty 1

## 2015-08-29 MED ORDER — DIPHENHYDRAMINE HCL 50 MG/ML IJ SOLN: 50.0000 mg | Freq: Once | INTRAMUSCULAR | Status: DC | PRN

## 2015-08-29 MED ORDER — HEPARIN SOD (PORK) LOCK FLUSH 100 UNIT/ML IV SOLN: 500.0000 [IU] | Freq: Every day | INTRAVENOUS | Status: DC | PRN

## 2015-08-29 MED ORDER — ZOLPIDEM TARTRATE 5 MG PO TABS
5.0000 mg | ORAL_TABLET | Freq: Every evening | ORAL | Status: DC | PRN
  Filled 2015-08-29 (×2): qty 1

## 2015-08-29 MED ORDER — HEPARIN SOD (PORK) LOCK FLUSH 100 UNIT/ML IV SOLN: 500.0000 [IU] | Freq: Once | INTRAVENOUS | Status: DC | PRN

## 2015-08-29 MED ORDER — ALBUTEROL SULFATE (2.5 MG/3ML) 0.083% IN NEBU: 2.5000 mg | INHALATION_SOLUTION | Freq: Once | RESPIRATORY_TRACT | Status: DC | PRN

## 2015-08-29 MED ORDER — SODIUM CHLORIDE 0.9 % IV SOLN
250.0000 mL | Freq: Once | INTRAVENOUS | Status: AC
  Administered 2015-08-29: 250 mL via INTRAVENOUS

## 2015-08-29 MED ORDER — ALTEPLASE 2 MG IJ SOLR
2.0000 mg | Freq: Once | INTRAMUSCULAR | Status: DC | PRN
  Filled 2015-08-29: qty 2

## 2015-08-29 MED ORDER — SODIUM CHLORIDE 0.9 % IV SOLN: Freq: Once | INTRAVENOUS | Status: DC | PRN

## 2015-08-29 MED ORDER — DIPHENHYDRAMINE HCL 50 MG PO CAPS
50.0000 mg | ORAL_CAPSULE | Freq: Once | ORAL | Status: AC
  Administered 2015-08-29: 50 mg via ORAL
  Filled 2015-08-29: qty 1

## 2015-08-29 MED ORDER — HEPARIN SOD (PORK) LOCK FLUSH 100 UNIT/ML IV SOLN: 250.0000 [IU] | Freq: Once | INTRAVENOUS | Status: DC | PRN

## 2015-08-29 NOTE — Progress Notes (Signed)
PROGRESS NOTE  Kristen Baker YOV:[id number] DOB: [birth year] DOA: 08/18/2015 PCP: No primary care provider on file.  HPI/Recap of past 48 hours: 81 year old female with past medical history of GERD and B-12 deficiency in 2-3 months of weight loss as well as previous cervical cancer admitted 10/9 for abdominal pain and with workup found to have retroperitoneal lymphadenopathy, thrombocytopenia. Bone marrow and lymph nodes biopsied noting Burkitt's lymphoma. Patient started on chemotherapy.  She has been responding well to chemotherapy. She does feel fatigued. No pain. No other complaints  Assessment/Plan: Principal Problem:   Burkitt lymphoma ([place]) with secondary thrombocytopenia Active Problems:   Thrombocytopenia (HCC)   Generalized weakness   Weight loss   Vitamin B12 deficiency   GERD (gastroesophageal reflux disease)   Hx of gastric ulcer   Hx of cervical cancer   Hypokalemia: Secondary to dehydration. Replaced.    Constipation: Resolved with medication   Burkitt lymphoma of lymph nodes of multiple regions [facility]) with secondary thrombocytopenia, though meningeal disease: Seen by oncology. Started on allopurinol, IV dexamethasone to prevent tumor lysis as well as chemotherapy. Patient was given a transfusion of platelets done 10/17. Plan is for intrathecal injection of methotrexate during spinal tap tomorrow    AKI (acute kidney injury) ([place]): secondary dehydration. With IV fluids, normal     Code Status: Full code  Family Communication: Left message with family  Disposition Plan: Potential discharge Saturday with immediate follow-up at the cancer center   Consultants:  Oncology  Interventional radiology  Procedures:  Status post transfusion of platelets 10/17  Status post core needle biopsy done 10/12 noting lymphoma  Placement of Port-A-Cath  Antibiotics:  IV Ancef 1 dose 10/17   Objective: BP 128/38 mmHg  Pulse 79  Temp(Src) 98.1 F (36.7 C) (Oral)   Resp 16  Ht 5' 7.2" (1.707 m)  Wt 83.099 kg (183 lb 3.2 oz)  BMI 28.52 kg/m2  SpO2 95%  Intake/Output Summary (Last 24 hours) at 08/29/15 1507 Last data filed at 08/29/15 1250  Gross per 24 hour  Intake 858.36 ml  Output   2500 ml  Net -1641.64 ml   Filed Weights   08/24/15 1543 08/26/15 0423 08/29/15 0500  Weight: 80.468 kg (177 lb 6.4 oz) 79.516 kg (175 lb 4.8 oz) 83.099 kg (183 lb 3.2 oz)    Exam: Little change from previous day  General:  Alert and oriented 3, fatigued, no acute distress  Cardiovascular: Regular rate and rhythm, S1 and S2  Respiratory: Clear to auscultation bilaterally  Abdomen: Soft, nontender, distended, hypoactive bowel sounds  Musculoskeletal: No clubbing or cyanosis, trace edema   Data Reviewed: Basic Metabolic Panel:  Recent Labs Lab 08/22/15 1630  08/26/15 0426 08/27/15 0426 08/27/15 1215 08/28/15 0420 08/29/15 0437  NA  --   < > 135 136 135 135  136 138  137  K  --   < > 4.7 4.7 4.7 4.6  4.7 5.3*  5.3*  CL  --   < > 101 103 102 104  105 104  103  CO2  --   < > 27 26 27 25  26 28  29   GLUCOSE  --   < > 158* 159* 132* 144*  144* 144*  146*  BUN  --   < > 25* 24* 23* 22*  22* 21*  21*  CREATININE  --   < > 0.74 0.68 0.67 0.63  0.62 0.73  0.74  CALCIUM  --   < > 8.2* 8.0* 8.0*  7.8*  7.8* 8.1*  8.1*  PHOS 4.0  --  4.3 4.2  --  4.0 3.8  < > = values in this interval not displayed. Liver Function Tests:  Recent Labs Lab 08/24/15 0515 08/26/15 0426 08/27/15 0426 08/27/15 1215 08/28/15 0420 08/29/15 0437  AST 27 19  --  11* 11* 13*  ALT 22 28  --  20 18 17   ALKPHOS 67 63  --  70 63 62  BILITOT 0.4 0.6  --  0.5 0.7 0.8  PROT 5.0* 5.2*  --  5.5* 4.6* 4.7*  ALBUMIN 3.0* 3.1* 3.2* 3.2* 2.8*  2.8* 2.8*  2.8*   No results for input(s): LIPASE, AMYLASE in the last 168 hours. No results for input(s): AMMONIA in the last 168 hours. CBC:  Recent Labs Lab 08/24/15 0515 08/26/15 0426 08/27/15 1215 08/28/15 0420  08/29/15 0437  WBC 2.9* 2.4* 2.8* 2.2* 2.0*  NEUTROABS 1.5*  --  1.9 1.5* 1.5*  HGB 10.0* 9.2* 9.9* 8.4* 8.5*  HCT 29.8* 27.9* 30.1* 25.1* 25.6*  MCV 87.1 88.0 87.8 87.8 88.6  PLT 38* 115* 135* 123* 129*   Cardiac Enzymes:    Recent Labs Lab 08/28/15 1755 08/29/15 0437  TROPONINI <0.03 <0.03   BNP (last 3 results) No results for input(s): BNP in the last 8760 hours.  ProBNP (last 3 results) No results for input(s): PROBNP in the last 8760 hours.  CBG:  Recent Labs Lab 08/27/15 0925  GLUCAP 126*    Recent Results (from the past 240 hour(s))  CSF culture with Stat gram stain     Status: None   Collection Time: 08/20/15  4:55 PM  Result Value Ref Range Status   Specimen Description CSF  Final   Special Requests NONE  Final   Gram Stain   Final    WBC PRESENT, PREDOMINANTLY MONONUCLEAR NO ORGANISMS SEEN CYTOSPIN    Culture NO GROWTH 3 DAYS  Final   Report Status 08/23/2015 FINAL  Final  Gram stain     Status: None   Collection Time: 08/26/15  2:22 PM  Result Value Ref Range Status   Specimen Description CSF  Final   Special Requests NONE  Final   Gram Stain   Final    NO ORGANISMS SEEN WBC SEEN Gram Stain Report Called to,Read Back By and Verified With: D TORRES AT 1517 ON 10.17.2016 BY NBROOKS    Report Status 08/26/2015 FINAL  Final     Studies: No results found.  Scheduled Meds: . sodium chloride   Intravenous Once  . sodium chloride   Intravenous Once  . sodium chloride  250 mL Intravenous Once  . acetaminophen  650 mg Oral Once  . allopurinol  100 mg Oral BID  . dexamethasone  4 mg Intravenous 3 times per day  . pantoprazole  40 mg Oral Daily  . polyethylene glycol  17 g Oral Daily  . riTUXimab (RITUXAN) IV infusion  375 mg/m2 (Treatment Plan Actual) Intravenous Once  . senna-docusate  2 tablet Oral QHS  . sodium bicarbonate/sodium chloride   Mouth Rinse QID  . Vitamin D (Ergocalciferol)  50,000 Units Oral Q7 days    Continuous Infusions: .  sodium chloride 20 mL/hr at 08/28/15 1037  . sodium chloride 100 mL/hr at 08/29/15 1338     Time spent: 15 minutes  [facility] Pager [phone]. If 7PM-7AM, please contact night-coverage at [email or web address], password [facility] 08/29/2015, 3:07 PM  LOS: 9 days

## 2015-08-29 NOTE — Progress Notes (Signed)
Patient tolerated Cytoxan and Rituxan infusion without complications.   

## 2015-08-29 NOTE — Progress Notes (Signed)
Physical Therapy Treatment Patient Details Name: Kristen Baker MRN: [id number] DOB: 02-11-1933 Today's Date: 09/26/2015    History of Present Illness Patient is a 81 y/o female presens with 1 month history of progressive nausea, vomiting, night sweats, weight loss, lower abdominal pain, headache, chin numbness, and easy brusing. Labs were notable for platelet count of 34; CT of her abdomen and pelvis showed retroperitoneal and lower posterior mediastinal lymphadenopathy suggestive of lymphoma, and a subtle increased sacral epidural soft tissue that cannot be excluded as a spinal metastasis. PMH of cervical ca. Awaiting peripheral blood flow cytometry, Bone Marrow and LN biopsy results 10/13.  Pt s/p port-a-cath placement and started chemotherapy 08/26/15.    PT Comments    Pt progressing very well.  Recommend nursing staff ambulate with pt.  Pt reports likely d/c home on Saturday.  Due to pt's good progress, improved mobility, and overall report of feeling better today, updated d/c recommendation to no PT f/u.   Follow Up Recommendations  Supervision - Intermittent;No PT follow up     Equipment Recommendations  3in1 (PT)    Recommendations for Other Services       Precautions / Restrictions Precautions Precautions: Fall Precaution Comments: chemo    Mobility  Bed Mobility Overal bed mobility: Modified Independent                Transfers Overall transfer level: Modified independent                  Ambulation/Gait Ambulation/Gait assistance: Supervision Ambulation Distance (Feet): 400 Feet Assistive device: Rolling walker (2 wheeled) Gait Pattern/deviations: Step-through pattern;Trunk flexed     General Gait Details: pt used RW today, cues for RW distance, good gait speed   Stairs            Wheelchair Mobility    Modified Rankin (Stroke Patients Only)       Balance                                    Cognition  Arousal/Alertness: Awake/alert Behavior During Therapy: WFL for tasks assessed/performed Overall Cognitive Status: Within Functional Limits for tasks assessed                      Exercises      General Comments        Pertinent Vitals/Pain Pain Assessment: No/denies pain    Home Living                      Prior Function            PT Goals (current goals can now be found in the care plan section) Progress towards PT goals: Progressing toward goals    Frequency  Min 3X/week    PT Plan Current plan remains appropriate    Co-evaluation             End of Session   Activity Tolerance: Patient tolerated treatment well Patient left: in bed;with call bell/phone within reach;with family/visitor present     Time: 6659-9357 PT Time Calculation (min) (ACUTE ONLY): 8 min  Charges:  $Gait Training: 8-22 mins                    G Codes:      Karo Rog,[name] 09-26-2015, 3:19 PM [name], PT, DPT 26-Sep-2015 Pager: [phone]

## 2015-08-29 NOTE — Progress Notes (Signed)
Manual calculation of BSA and dosing for Cytoxan completed with secondary verification completed by Aldean Baker, RN

## 2015-08-29 NOTE — Progress Notes (Signed)
Manual calculation of BSA and dosing for Rituximab completed with secondary verification completed by Aldean Baker, RN

## 2015-08-29 NOTE — Progress Notes (Signed)
Kristen Baker   HEMATOLOGY/ONCOLOGY INPATIENT PROGRESS NOTE  Date of Service: 08/29/2015  Inpatient Attending: .Annita Brod, MD   SUBJECTIVE  Patient was seen around noon. No acute new symptoms. Had some right-sided chest pain radiating to her arm yesterday. Troponins were negative. EKG was negative. No shortness of breath lightheadedness dizziness or diaphoresis but the symptoms. No CP/SOB currently.  No further nausea.  No fevers/chills.   OBJECTIVE:  PHYSICAL EXAMINATION: . Filed Vitals:   08/29/15 0500 08/29/15 1256 08/29/15 1533 08/29/15 1558  BP: 139/53 128/38 127/46 130/50  Pulse: 56 79 65 62  Temp: 97.5 F (36.4 C) 98.1 F (36.7 C) 97.9 F (36.6 C) 97.7 F (36.5 C)  TempSrc: Oral Oral Oral Oral  Resp: 16     Height:      Weight: 183 lb 3.2 oz (83.099 kg)     SpO2: 96% 95% 97% 96%   Filed Weights   08/24/15 1543 08/26/15 0423 08/29/15 0500  Weight: 177 lb 6.4 oz (80.468 kg) 175 lb 4.8 oz (79.516 kg) 183 lb 3.2 oz (83.099 kg)   .Body mass index is 28.52 kg/(m^2).   GENERAL:alert, in no acute distress and comfortable SKIN: skin color, texture, turgor are normal, no rashes or significant lesions EYES: normal, conjunctiva are pink and non-injected, sclera clear OROPHARYNX:no exudate, no erythema and lips, buccal mucosa, and tongue normal  NECK: supple, no JVD, thyroid normal size, non-tender, without nodularity LYMPH: no palpable lymphadenopathy in the cervical, axillary or inguinal LUNGS: clear to auscultation with normal respiratory effort HEART: regular rate & rhythm, no murmurs and no lower extremity edema ABDOMEN: abdomen soft, non-tender, normoactive bowel sounds  Musculoskeletal: no cyanosis of digits and no clubbing  PSYCH: alert & oriented x 3 with fluent speech NEURO: no focal motor/sensory deficits.  MEDICAL HISTORY:  Past Medical History  Diagnosis Date  . Cancer [facility])     cervical cancer    SURGICAL HISTORY: Past Surgical History  Procedure  Laterality Date  . Cholecystectomy    . Tonsillectomy    . Abdominal hysterectomy    . Abdominal surgery      2-3 rd of colon removed    SOCIAL HISTORY: Social History   Social History  . Marital Status: Married    Spouse Name: N/A  . Number of Children: N/A  . Years of Education: N/A   Occupational History  . Not on file.   Social History Main Topics  . Smoking status: Never Smoker   . Smokeless tobacco: Not on file  . Alcohol Use: No  . Drug Use: No  . Sexual Activity: Not on file   Other Topics Concern  . Not on file   Social History Narrative    FAMILY HISTORY: History reviewed. No pertinent family history.  ALLERGIES:  is allergic to cabbage; onion; shellfish allergy; sulfa antibiotics; and zofran.  MEDICATIONS:  Scheduled Meds: . sodium chloride   Intravenous Once  . sodium chloride   Intravenous Once  . sodium chloride  250 mL Intravenous Once  . acetaminophen  650 mg Oral Once  . allopurinol  100 mg Oral BID  . dexamethasone  4 mg Intravenous Q12H  . pantoprazole  40 mg Oral Daily  . polyethylene glycol  17 g Oral Daily  . senna-docusate  2 tablet Oral QHS  . sodium bicarbonate/sodium chloride   Mouth Rinse QID  . Vitamin D (Ergocalciferol)  50,000 Units Oral Q7 days   Continuous Infusions: . sodium chloride 20 mL/hr at 08/28/15 1037  .  sodium chloride 100 mL/hr at 08/29/15 1338   PRN Meds:.sodium chloride, albuterol, alteplase, alteplase, Cold Pack, diphenhydrAMINE, diphenhydrAMINE, EPINEPHrine, EPINEPHrine, EPINEPHrine, EPINEPHrine, famotidine, fludeoxyglucose F - 18, heparin lock flush, heparin lock flush, heparin lock flush, heparin lock flush, heparin lock flush, heparin lock flush, Hot Pack, iohexol, methylPREDNISolone sodium succinate, morphine injection, promethazine, sodium chloride, sodium chloride, sodium chloride, sodium chloride, sodium chloride, sodium chloride, traMADol, zolpidem  REVIEW OF SYSTEMS:    10 Point review of Systems was  done is negative except as noted above.   LABORATORY DATA:  I have reviewed the data as listed  . CBC Latest Ref Rng 08/29/2015 08/28/2015 08/27/2015  WBC 4.0 - 10.5 K/uL 2.0(L) 2.2(L) 2.8(L)  Hemoglobin 12.0 - 15.0 g/dL 8.5(L) 8.4(L) 9.9(L)  Hematocrit 36.0 - 46.0 % 25.6(L) 25.1(L) 30.1(L)  Platelets 150 - 400 K/uL 129(L) 123(L) 135(L)    . CMP Latest Ref Rng 08/29/2015 08/29/2015 08/28/2015  Glucose 65 - 99 mg/dL 144(H) 146(H) 144(H)  BUN 6 - 20 mg/dL 21(H) 21(H) 22(H)  Creatinine 0.44 - 1.00 mg/dL 0.73 0.74 0.63  Sodium 135 - 145 mmol/L 138 137 135  Potassium 3.5 - 5.1 mmol/L 5.3(H) 5.3(H) 4.6  Chloride 101 - 111 mmol/L 104 103 104  CO2 22 - 32 mmol/L 28 29 25   Calcium 8.9 - 10.3 mg/dL 8.1(L) 8.1(L) 7.8(L)  Total Protein 6.5 - 8.1 g/dL 4.7(L) - 4.6(L)  Total Bilirubin 0.3 - 1.2 mg/dL 0.8 - 0.7  Alkaline Phos 38 - 126 U/L 62 - 63  AST 15 - 41 U/L 13(L) - 11(L)  ALT 14 - 54 U/L 17 - 18   . Lab Results  Component Value Date   LDH 352* 08/29/2015     RADIOGRAPHIC STUDIES: I have personally reviewed the radiological images as listed and agreed with the findings in the report. Ct Chest Wo Contrast  08/19/2015  CLINICAL DATA:  81 year old female with abnormal CT Abdomen and Pelvis, possible lymphoma. Initial encounter. EXAM: CT CHEST WITHOUT CONTRAST TECHNIQUE: Multidetector CT imaging of the chest was performed following the standard protocol without IV contrast. COMPARISON:  CT Abdomen and Pelvis 08/18/2015. Chest and abdominal radiographs 08/18/2015. FINDINGS: Noncontrast exam. Stable abnormal inferior posterior mediastinum and retrocrural soft tissue as described recently. No pericardial effusion. No pleural effusion. Mediastinal and axillary lymph nodes are normal. Thoracic inlet lymph nodes are normal. Calcified aortic and coronary artery atherosclerosis. Major airways are patent. There are occasional calcified granulomas. There is right upper lobe bronchiectasis with mild  patchy peribronchial opacity along the major fissure (series 305, image 16). Mild tree-in-bud nodular opacity along the inferior lateral right upper lobe near the minor fissure (image 24). Minor dependent atelectasis. Degenerative changes in the thoracic spine. No acute or suspicious osseous lesion in the chest. IMPRESSION: 1. Abnormal posterior mediastinal/retrocrural soft tissue as seen on the recent CT Abdomen and Pelvis. Mediastinal, axillary, and thoracic inlet lymph nodes are normal. 2. Right upper lobe bronchiectasis and peripheral / peribronchial opacity compatible with distal airway infection or less likely scarring. Electronically Signed   By: Genevie Ann M.D.   On: 08/19/2015 23:39   [name] OB Contrast  08/19/2015  CLINICAL DATA:  Headache.  Possible lymphoma. EXAM: MRI HEAD WITHOUT AND WITH CONTRAST TECHNIQUE: Multiplanar, multiecho pulse sequences of the brain and surrounding structures were obtained without and with intravenous contrast. CONTRAST:  42mL MULTIHANCE GADOBENATE DIMEGLUMINE 529 MG/ML IV SOLN COMPARISON:  None. FINDINGS: Ventricle size normal. Cerebral volume normal. Pituitary normal in size. Craniocervical junction  normal. Negative for acute infarct. Minimal hyperintensity in the periventricular white matter may represent chronic ischemia. Negative for hemorrhage or fluid collection. Negative for edema in the brain Postcontrast imaging reveals leptomeningeal enhancement diffusely and bilaterally. There is mild smooth enhancement of the meninges without nodularity. No enhancing mass lesion in the brain. IMPRESSION: Diffuse leptomeningeal enhancement. Given the history, this is concerning for lymphomatous involvement. Lumbar puncture with cytology recommended. Negative for acute infarct or mass lesion. Electronically Signed   By: Franchot Gallo M.D.   On: 08/19/2015 20:47   Ct Abdomen Pelvis W Contrast  08/18/2015  CLINICAL DATA:  81 year old female with lower abdominal pain nausea  and vomiting for 1 month. Initial encounter. EXAM: CT ABDOMEN AND PELVIS WITH CONTRAST TECHNIQUE: Multidetector CT imaging of the abdomen and pelvis was performed using the standard protocol following bolus administration of intravenous contrast. CONTRAST:  25mL OMNIPAQUE IOHEXOL 300 MG/ML SOLN, 142mL OMNIPAQUE IOHEXOL 300 MG/ML SOLN COMPARISON:  [facility] Chest CTA 11/20/2012. Acute abdominal series from today FINDINGS: Mild respiratory motion artifact at the lung bases. There are chronic surgical clips about the distal thoracic esophagus and gastroesophageal junction. New since 2014 in the posterior mediastinum and tracking toward the retrocrural space is abnormal confluent soft tissue anterior to the thoracic spine and inseparable from the medial wall of the descending thoracic aorta measuring 3 x 44 x 54 mm (AP by transverse by CC). This seems to be separate from both the aorta and esophagus. There is no anterior thoracic spine erosion. Calcified plaque along this segment of the aorta. No associated pericardial or pleural effusion. Mild bronchiectasis at both lung bases with no confluent pulmonary opacity. Degenerative changes in the spine. Chronic 10 mm lucent area in the left T12 vertebral body is stable and most resembled hemangioma in 2014. No acute or suspicious osseous lesion is identified however, there is subtle increased ventral epidural soft tissue seen in the sacrum -sagittal image 73. Mild to moderate nonspecific presacral stranding. No pelvic free fluid. Negative rectum with retained stool. Uterus surgically absent. Adnexa within normal limits. Numerous pelvic phleboliths. Negative urinary bladder. Redundant sigmoid colon. Proximal sigmoid and left colon diverticulosis with no active inflammation identified. Negative transverse colon. Negative right colon. Ileocecal valve lipoma incidentally noted. Appendix diminutive or absent. Negative terminal ileum. No dilated or abnormal small bowel  loops. Occasional surgical clips in the greater omentum. Diminutive stomach. Duodenum within normal limits. Major arterial structures are patent in the abdomen and pelvis with fairly extensive calcified aortic atherosclerosis. Portal venous system appears to be patent. Retroperitoneal lymphadenopathy maximal at the lower lumbar spine level anterior to the IVC measuring up to 22 mm short axis. Numerous increased para renal and other retroperitoneal space nodes are individually up to 14 mm short axis. Mesenteric nodes in the abdomen and pelvis have a more normal appearance. No pelvic sidewall or inguinal lymphadenopathy identified. Solitary small nonspecific low-density area in the right hepatic lobe measuring 10 mm on series 2, image 20, favor benign. Gallbladder not identified and felt to be surgically absent. No splenomegaly or splenic lesion. Negative pancreas and adrenal glands. Bilateral renal enhancement and contrast excretion within normal limits. No abdominal free fluid. IMPRESSION: 1. Retroperitoneal and lower posterior mediastinal / retrocrural soft tissue masses most compatible with lymphadenopathy, and most suggestive of Lymphoma. Some of these might be amenable to CT-guided biopsy, uncertain. 2. Subtle increased sacral epidural soft tissue, but no destructive osseous lesion identified. Nonspecific presacral stranding. Metastatic disease to the spine not excluded. Electronically  Signed   By: Genevie Ann M.D.   On: 08/18/2015 18:52   Nm Pet Image Initial (pi) Skull Base To Thigh  08/27/2015  CLINICAL DATA:  Initial treatment strategy for lymphoma. EXAM: NUCLEAR MEDICINE PET VERTEX TO THIGH TECHNIQUE: 8.75 mCi F-18 FDG was injected intravenously. Full-ring PET imaging was performed from the vertex to thigh after the radiotracer. CT data was obtained and used for attenuation correction and anatomic localization. FASTING BLOOD GLUCOSE:  Value: 126 mg/dl COMPARISON:  08/19/2015 FINDINGS: HEAD AND NECK No  abnormal radiotracer accumulation identified within the brain. A small amount of gas is noted within the anterior horn of the left lateral ventricle. No hypermetabolic lymph nodes in the neck. CHEST Hypermetabolic posterior mediastinal soft tissue at the level of the thigh for ppm attic hiatus is identified. This measures 2 x 2.5 cm and has an SUV max equal to 6.9. No enlarged or hypermetabolic mediastinal or hilar lymph nodes. No hypermetabolic axillary or supraclavicular adenopathy. No suspicious pulmonary nodules identified on the CT images. Calcified granuloma is identified within the posterior left upper lobe. ABDOMEN/PELVIS No abnormal hypermetabolic activity within the liver, pancreas, adrenal glands, or spleen. Prominent retroperitoneal lymph nodes are identified. Index pre caval node just above the aortic bifurcation measures 1.3 cm and has an SUV max equal to 3.50. Increased presacral soft tissue is identified which exhibits mild increased uptake. This measures 1.x 5.1 x 4.0 cm. The SUV max within this area is equal to 4.13. SKELETON Diffuse heterogeneous tracer activity is identified throughout the bone marrow of the axial and proximal appendicular skeleton. More focal areas of intense uptake are noted within the thoracic spine, lumbar spine and pelvis. For example cuff within the L3 vertebra there is an area of increased uptake which has an SUV max equal to 6.38. IMPRESSION: 1. Abnormal soft tissue within the posterior mediastinum exhibits malignant range FDG uptake and is compatible with clinical history of lymphoma. 2. There are a few enlarged retroperitoneal lymph nodes within the lower abdomen which exhibits nonspecific FDG uptake. Additionally, there is mild increased presacral soft tissue exhibiting low level FDG uptake. 3. Diffuse heterogeneous uptake throughout the bone marrow is noted and is worrisome for lymphomatous involvement. Electronically Signed   By: Kerby Moors M.D.   On: 08/27/2015  11:50   Ir Fluoro Guide Cv Line Right  08/26/2015  CLINICAL DATA:  ACCESS FOR CHEMOTHERAPY, BURKITT'S LYMPHOMA EXAM: RIGHT INTERNAL JUGULAR SINGLE LUMEN POWER PORT CATHETER INSERTION Date:  10/17/201610/17/2016 11:08 am Radiologist:  M. Daryll Brod, MD Guidance:  Ultrasound and fluoroscopic FLUOROSCOPY TIME:  30 seconds, 3 mGy MEDICATIONS AND MEDICAL HISTORY: 2 g Ancefadministered within 1 hour of the procedure.3 mg Versed, 50 mcg fentanyl ANESTHESIA/SEDATION: 30 minutes CONTRAST:  None. COMPLICATIONS: None immediate PROCEDURE: Informed consent was obtained from the patient following explanation of the procedure, risks, benefits and alternatives. The patient understands, agrees and consents for the procedure. All questions were addressed. A time out was performed. Maximal barrier sterile technique utilized including caps, mask, sterile gowns, sterile gloves, large sterile drape, hand hygiene, and 2% chlorhexidine scrub. Under sterile conditions and local anesthesia, right internal jugular micropuncture venous access was performed. Access was performed with ultrasound. Images were obtained for documentation. A guide wire was inserted followed by a transitional dilator. This allowed insertion of a guide wire and catheter into the IVC. Measurements were obtained from the SVC / RA junction back to the right IJ venotomy site. In the right infraclavicular chest, a subcutaneous pocket was  created over the second anterior rib. This was done under sterile conditions and local anesthesia. 1% lidocaine with epinephrine was utilized for this. A 2.5 cm incision was made in the skin. Blunt dissection was performed to create a subcutaneous pocket over the right pectoralis major muscle. The pocket was flushed with saline vigorously. There was adequate hemostasis. The port catheter was assembled and checked for leakage. The port catheter was secured in the pocket with two retention sutures. The tubing was tunneled  subcutaneously to the right venotomy site and inserted into the SVC/RA junction through a valved peel-away sheath. Position was confirmed with fluoroscopy. Images were obtained for documentation. The patient tolerated the procedure well. No immediate complications. Incisions were closed in a two layer fashion with 4 - 0 Vicryl suture. Dermabond was applied to the skin. The port catheter was accessed, blood was aspirated followed by saline and heparin flushes. Needle was removed. A dry sterile dressing was applied. IMPRESSION: Ultrasound and fluoroscopically guided right internal jugular single lumen power port catheter insertion. Tip in the SVC/RA junction. Catheter ready for use. Electronically Signed   By: Jerilynn Mages.  Shick M.D.   On: 08/26/2015 11:44   Ir US Guide Vasc Access Right  08/26/2015  CLINICAL DATA:  ACCESS FOR CHEMOTHERAPY, BURKITT'S LYMPHOMA EXAM: RIGHT INTERNAL JUGULAR SINGLE LUMEN POWER PORT CATHETER INSERTION Date:  10/17/201610/17/2016 11:08 am Radiologist:  M. Daryll Brod, MD Guidance:  Ultrasound and fluoroscopic FLUOROSCOPY TIME:  30 seconds, 3 mGy MEDICATIONS AND MEDICAL HISTORY: 2 g Ancefadministered within 1 hour of the procedure.3 mg Versed, 50 mcg fentanyl ANESTHESIA/SEDATION: 30 minutes CONTRAST:  None. COMPLICATIONS: None immediate PROCEDURE: Informed consent was obtained from the patient following explanation of the procedure, risks, benefits and alternatives. The patient understands, agrees and consents for the procedure. All questions were addressed. A time out was performed. Maximal barrier sterile technique utilized including caps, mask, sterile gowns, sterile gloves, large sterile drape, hand hygiene, and 2% chlorhexidine scrub. Under sterile conditions and local anesthesia, right internal jugular micropuncture venous access was performed. Access was performed with ultrasound. Images were obtained for documentation. A guide wire was inserted followed by a transitional dilator. This  allowed insertion of a guide wire and catheter into the IVC. Measurements were obtained from the SVC / RA junction back to the right IJ venotomy site. In the right infraclavicular chest, a subcutaneous pocket was created over the second anterior rib. This was done under sterile conditions and local anesthesia. 1% lidocaine with epinephrine was utilized for this. A 2.5 cm incision was made in the skin. Blunt dissection was performed to create a subcutaneous pocket over the right pectoralis major muscle. The pocket was flushed with saline vigorously. There was adequate hemostasis. The port catheter was assembled and checked for leakage. The port catheter was secured in the pocket with two retention sutures. The tubing was tunneled subcutaneously to the right venotomy site and inserted into the SVC/RA junction through a valved peel-away sheath. Position was confirmed with fluoroscopy. Images were obtained for documentation. The patient tolerated the procedure well. No immediate complications. Incisions were closed in a two layer fashion with 4 - 0 Vicryl suture. Dermabond was applied to the skin. The port catheter was accessed, blood was aspirated followed by saline and heparin flushes. Needle was removed. A dry sterile dressing was applied. IMPRESSION: Ultrasound and fluoroscopically guided right internal jugular single lumen power port catheter insertion. Tip in the SVC/RA junction. Catheter ready for use. Electronically Signed   By: Jerilynn Mages.  Shick  M.D.   On: 08/26/2015 11:44   Ct Biopsy  08/21/2015  CLINICAL DATA:  Suspected lymphoma EXAM: CT-GUIDED BIOPSY BONE MARROW AND PARA-AORTIC LYMPH NODE. MEDICATIONS AND MEDICAL HISTORY: Versed 2 mg, Fentanyl 100 mcg. Additional Medications: None. ANESTHESIA/SEDATION: Moderate sedation time: Third minutes PROCEDURE: The procedure, risks, benefits, and alternatives were explained to the patient. Questions regarding the procedure were encouraged and answered. The patient  understands and consents to the procedure. The back was prepped with Betadine in a sterile fashion, and a sterile drape was applied covering the operative field. A sterile gown and sterile gloves were used for the procedure. Under CT guidance, an 11 gauge needle was inserted into the right iliac bone via posterior approach. Aspirates and a core were obtained Under CT guidance, an 17 gauge needle was inserted into the para caval lymph node via right posterior lateral approach. Four 18 gauge core biopsies were obtained. The guide needle was removed. Patient tolerated the procedure well without complication. Vital sign monitoring by nursing staff during the procedure will continue as patient is in the special procedures unit for post procedure observation. FINDINGS: The images document guide needle placement within the right iliac bone and pericaval lymph node. Post biopsy images demonstrate no hemorrhage. COMPLICATIONS: None IMPRESSION: Successful CT-guided bone marrow aspirate, bone marrow core, and pericaval lymph node core which was placed in saline for lymphoma flow analysis. Electronically Signed   By: Marybelle Killings M.D.   On: 08/21/2015 14:10   Ct Biopsy  08/21/2015  CLINICAL DATA:  Suspected lymphoma EXAM: CT-GUIDED BIOPSY BONE MARROW AND PARA-AORTIC LYMPH NODE. MEDICATIONS AND MEDICAL HISTORY: Versed 2 mg, Fentanyl 100 mcg. Additional Medications: None. ANESTHESIA/SEDATION: Moderate sedation time: Third minutes PROCEDURE: The procedure, risks, benefits, and alternatives were explained to the patient. Questions regarding the procedure were encouraged and answered. The patient understands and consents to the procedure. The back was prepped with Betadine in a sterile fashion, and a sterile drape was applied covering the operative field. A sterile gown and sterile gloves were used for the procedure. Under CT guidance, an 11 gauge needle was inserted into the right iliac bone via posterior approach. Aspirates  and a core were obtained Under CT guidance, an 17 gauge needle was inserted into the para caval lymph node via right posterior lateral approach. Four 18 gauge core biopsies were obtained. The guide needle was removed. Patient tolerated the procedure well without complication. Vital sign monitoring by nursing staff during the procedure will continue as patient is in the special procedures unit for post procedure observation. FINDINGS: The images document guide needle placement within the right iliac bone and pericaval lymph node. Post biopsy images demonstrate no hemorrhage. COMPLICATIONS: None IMPRESSION: Successful CT-guided bone marrow aspirate, bone marrow core, and pericaval lymph node core which was placed in saline for lymphoma flow analysis. Electronically Signed   By: Marybelle Killings M.D.   On: 08/21/2015 14:10   Dg Abd Acute W/chest  08/18/2015  CLINICAL DATA:  Chest pain and abdominal pain. EXAM: DG ABDOMEN ACUTE W/ 1V CHEST COMPARISON:  Chest x-ray dated 12/12/2012 performed at [place]: Heart size and pulmonary vascularity are normal and the lungs are clear. Surgical clips at the gastroesophageal junction. Slight thoracolumbar scoliosis. No free air or free fluid in the abdomen. Bowel gas pattern is normal. Surgical clips and staples in the abdomen. Multiple phleboliths in the pelvis. No acute osseous abnormality. IMPRESSION: Negative abdominal radiographs.  No acute cardiopulmonary disease. Electronically Signed   By: Jeneen Rinks  Maxwell M.D.   On: 08/18/2015 17:17   Dg Fluoro Guide Lumbar Puncture  08/26/2015  CLINICAL DATA:  Diagnostic lumbar puncture with intrathecal chemotherapy injection EXAM: FLUOROSCOPICALLY GUIDED LUMBAR PUNCTURE FOR INTRATHECAL CHEMOTHERAPY FLUOROSCOPY TIME:  dictate in minutes and seconds Radiation Exposure Index (as provided by the fluoroscopic device): Not available on this device If the device does not provide the exposure index: Fluoroscopy Time (in  minutes and seconds):  1 minutes 32 seconds Number of Acquired Images:  1 PROCEDURE: Informed consent was obtained from the patient prior to the procedure, including potential complications of headache, allergy, and pain. With the patient prone, the lower back was prepped with Betadine. 1% Lidocaine was used for local anesthesia. Lumbar puncture was performed at the L3-4 level without success, likely due to ligamentous ossification. After numbing, attempt was then made at the L2-3 level, with dry tap despite good needle placement. After numbing, needle placed at the L5-S1 level with easy access to somewhat low pressure CSF (patient NPO for port placement this am). A 20 gauge needle was used. 11 cc of clear CSF were obtained for laboratory studies. The entire syringe of laboratory prepared methotrexate was then injected into the subarachnoid space. The patient tolerated the procedure well without apparent complication. IMPRESSION: Diagnostic lumbar puncture and intrathecal injection of chemotherapy without complication. Electronically Signed   By: Monte Fantasia M.D.   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 Patient has significant type B constitutional symptoms.  MRI Brain with evidence of leptomeningeal involvement consistent with the patients symptoms of chin numbnes though LP was unrevealing. BM packed with lymphoma/leukemia. LDH down from 3000's to 1900 to 1215 to 500's to 368 to 352  2) Chin numbness due to possible leptomeningeal involvement. Notes headaches on and off with no other overt focal neurological deficits. Patient notes  headaches have resolved. 3) Thrombocytopenia likely related to lymphoma. Platelets 115k after transfusion yesterday. 4) Leukoerythroblastic picture likely from bone marrow Lymphoma involvement. - resolved 4) Abdominal fatigue/back pain - likely from retroperitoneal LNadenopathy - back pain improved. 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 -mild hyperkalemia likely from volume contraction - IVF ordered --will monitor. -reduced dexamethasone to 4mg  IV q12h  -would discharge on dexamethasone 4mg  po daily with PPI till she is seen in clinic on 09/09/2015 then will taper off. -next IT methotrexate as outpatient on 09/09/2015. -clinic f/u on 09/09/2015 with labs. -next cycle of inpatient EPOCH-R on 09/16/2015 -we will rpt MRI brain prior to cycle 2. -continue PT and encourage frequent ambulation. -home with Home care services likely Saturday AM  -Neulasta shot in infusion clinic on Monday. -will order 1 unit of PRBC today. Informed consent obtained from patient.    Sullivan Lone MD [place] AAHIVMS [facility] Hematology/Oncology Physician [place]  (Office):  [phone] (Work cell):  [phone] (Fax):           [phone]

## 2015-08-29 NOTE — Care Management Important Message (Signed)
Important Message  Patient Details  Name: Kristen Baker MRN: [id number] Date of Birth: 05-31-1933   Medicare Important Message Given:  Yes-fourth notification given    [name] 08/29/2015, 10:54 AMImportant Message  Patient Details  Name: Kristen Baker MRN: [id number] Date of Birth: 15-Jan-1933   Medicare Important Message Given:  Yes-fourth notification given    [name] 08/29/2015, 10:54 AM

## 2015-08-29 NOTE — Progress Notes (Signed)
PT updated pt's recommendation to intermittent supervision-no PT follow up. This CM will continue to follow but do not anticipate further DC needs in addition to DME requests which have already been taken care of. Marney Doctor RN,BSN,NCM [phone]

## 2015-08-29 NOTE — Telephone Encounter (Signed)
Patient is an inpatient of dr Ocie Bob to schedule injection on 10/24 and patient will get appointment upon d/c

## 2015-08-30 ENCOUNTER — Inpatient Hospital Stay (HOSPITAL_COMMUNITY): Payer: Medicare Other

## 2015-08-30 ENCOUNTER — Telehealth: Payer: Self-pay | Admitting: Hematology

## 2015-08-30 ENCOUNTER — Other Ambulatory Visit: Payer: Self-pay | Admitting: Hematology

## 2015-08-30 ENCOUNTER — Other Ambulatory Visit: Payer: Self-pay | Admitting: *Deleted

## 2015-08-30 DIAGNOSIS — C8378 Burkitt lymphoma, lymph nodes of multiple sites: Secondary | ICD-10-CM

## 2015-08-30 DIAGNOSIS — D6489 Other specified anemias: Secondary | ICD-10-CM

## 2015-08-30 LAB — CBC WITH DIFFERENTIAL/PLATELET
BASOS ABS: 0 10*3/uL (ref 0.0–0.1)
BASOS PCT: 1 %
EOS ABS: 0 10*3/uL (ref 0.0–0.7)
EOS PCT: 0 %
HCT: 25.9 % — ABNORMAL LOW (ref 36.0–46.0)
Hemoglobin: 8.7 g/dL — ABNORMAL LOW (ref 12.0–15.0)
LYMPHS ABS: 0.3 10*3/uL — AB (ref 0.7–4.0)
Lymphocytes Relative: 15 %
MCH: 29.2 pg (ref 26.0–34.0)
MCHC: 33.6 g/dL (ref 30.0–36.0)
MCV: 86.9 fL (ref 78.0–100.0)
Monocytes Absolute: 0 10*3/uL — ABNORMAL LOW (ref 0.1–1.0)
Monocytes Relative: 1 %
Neutro Abs: 1.7 10*3/uL (ref 1.7–7.7)
Neutrophils Relative %: 83 %
PLATELETS: 117 10*3/uL — AB (ref 150–400)
RBC: 2.98 MIL/uL — AB (ref 3.87–5.11)
RDW: 15.7 % — ABNORMAL HIGH (ref 11.5–15.5)
WBC: 2 10*3/uL — AB (ref 4.0–10.5)

## 2015-08-30 LAB — COMPREHENSIVE METABOLIC PANEL
ALBUMIN: 2.7 g/dL — AB (ref 3.5–5.0)
ALT: 20 U/L (ref 14–54)
AST: 15 U/L (ref 15–41)
Alkaline Phosphatase: 58 U/L (ref 38–126)
Anion gap: 6 (ref 5–15)
BUN: 21 mg/dL — ABNORMAL HIGH (ref 6–20)
CHLORIDE: 102 mmol/L (ref 101–111)
CO2: 29 mmol/L (ref 22–32)
CREATININE: 0.61 mg/dL (ref 0.44–1.00)
Calcium: 7.9 mg/dL — ABNORMAL LOW (ref 8.9–10.3)
GFR calc non Af Amer: >60 mL/min (ref >60)
Glucose, Bld: 117 mg/dL — ABNORMAL HIGH (ref 65–99)
Potassium: 4.4 mmol/L (ref 3.5–5.1)
SODIUM: 137 mmol/L (ref 135–145)
Total Bilirubin: 0.8 mg/dL (ref 0.3–1.2)
Total Protein: 4.5 g/dL — ABNORMAL LOW (ref 6.5–8.1)

## 2015-08-30 LAB — TYPE AND SCREEN
ABO/RH(D): O POS
ANTIBODY SCREEN: NEGATIVE
UNIT DIVISION: 0

## 2015-08-30 LAB — PHOSPHORUS: PHOSPHORUS: 3.3 mg/dL (ref 2.5–4.6)

## 2015-08-30 LAB — CSF CELL COUNT WITH DIFFERENTIAL
EOS CSF: 0 % (ref 0–1)
LYMPHS CSF: 22 % — AB (ref 40–80)
MONOCYTE-MACROPHAGE-SPINAL FLUID: 75 % — AB (ref 15–45)
RBC Count, CSF: 217 /mm3 — ABNORMAL HIGH
SEGMENTED NEUTROPHILS-CSF: 3 % (ref 0–6)
TUBE #: 1
WBC, CSF: 4 /mm3 (ref 0–5)

## 2015-08-30 LAB — URIC ACID: Uric Acid, Serum: 2.8 mg/dL (ref 2.3–6.6)

## 2015-08-30 LAB — LACTATE DEHYDROGENASE: LDH: 311 U/L — AB (ref 98–192)

## 2015-08-30 LAB — PROTEIN, CSF: TOTAL PROTEIN, CSF: 44 mg/dL (ref 15–45)

## 2015-08-30 LAB — GLUCOSE, CSF: GLUCOSE CSF: 66 mg/dL (ref 40–70)

## 2015-08-30 MED ORDER — SODIUM CHLORIDE 0.9 % IJ SOLN
Freq: Once | INTRAMUSCULAR | Status: AC
  Administered 2015-08-30: 20:00:00 via INTRATHECAL
  Filled 2015-08-30: qty 0.48

## 2015-08-30 NOTE — Procedures (Signed)
Technically successful fluoroscopic guided LP yielding 12 cc of clear CSF followed by intrathecal chemo administrations. No immediate post procedural complications.

## 2015-08-30 NOTE — Progress Notes (Signed)
[name] Kitchen   HEMATOLOGY/ONCOLOGY INPATIENT PROGRESS NOTE  Date of Service: 08/30/2015  Inpatient Attending: .Elmarie Shiley, MD   SUBJECTIVE  Patient was seen around noon. No acute new symptoms. Just returned from her second dose of intrathecal methotrexate plus hydrocortisone. No headaches. No new focal neurological deficits. No back pains. No fevers or chills. Eager to go home. No other acute new symptoms. Tolerated the blood transfusion well yesterday. Daughter and son were in the room. Their questions were answered in details. Discharge plan for tomorrow morning was discussed in details .   OBJECTIVE:  PHYSICAL EXAMINATION: . Filed Vitals:   08/29/15 2044 08/29/15 2120 08/29/15 2308 08/30/15 0507  BP: 140/52 124/56 130/58 142/50  Pulse: 65 58 63 63  Temp: 97.6 F (36.4 C) 97.9 F (36.6 C) 97.5 F (36.4 C) 98 F (36.7 C)  TempSrc: Oral Oral Oral Oral  Resp: 20 20 20 16   Height:      Weight:    182 lb 8.7 oz (82.8 kg)  SpO2: 98% 97% 98% 96%   Filed Weights   08/26/15 0423 08/29/15 0500 08/30/15 0507  Weight: 175 lb 4.8 oz (79.516 kg) 183 lb 3.2 oz (83.099 kg) 182 lb 8.7 oz (82.8 kg)   .Body mass index is 28.42 kg/(m^2).   GENERAL:alert, in no acute distress and comfortable SKIN: skin color, texture, turgor are normal, no rashes or significant lesions EYES: normal, conjunctiva are pink and non-injected, sclera clear OROPHARYNX:no exudate, no erythema and lips, buccal mucosa, and tongue normal  NECK: supple, no JVD, thyroid normal size, non-tender, without nodularity LYMPH: no palpable lymphadenopathy in the cervical, axillary or inguinal LUNGS: clear to auscultation with normal respiratory effort HEART: regular rate & rhythm, no murmurs and no lower extremity edema ABDOMEN: abdomen soft, non-tender, normoactive bowel sounds  Musculoskeletal: no cyanosis of digits and no clubbing  PSYCH: alert & oriented x 3 with fluent speech NEURO: no focal motor/sensory  deficits.  MEDICAL HISTORY:  Past Medical History  Diagnosis Date  . Cancer [facility])     cervical cancer    SURGICAL HISTORY: Past Surgical History  Procedure Laterality Date  . Cholecystectomy    . Tonsillectomy    . Abdominal hysterectomy    . Abdominal surgery      2-3 rd of colon removed    SOCIAL HISTORY: Social History   Social History  . Marital Status: Married    Spouse Name: N/A  . Number of Children: N/A  . Years of Education: N/A   Occupational History  . Not on file.   Social History Main Topics  . Smoking status: Never Smoker   . Smokeless tobacco: Not on file  . Alcohol Use: No  . Drug Use: No  . Sexual Activity: Not on file   Other Topics Concern  . Not on file   Social History Narrative    FAMILY HISTORY: History reviewed. No pertinent family history.  ALLERGIES:  is allergic to cabbage; onion; shellfish allergy; sulfa antibiotics; and zofran.  MEDICATIONS:  Scheduled Meds: . sodium chloride   Intravenous Once  . sodium chloride   Intravenous Once  . allopurinol  100 mg Oral BID  . dexamethasone  4 mg Intravenous Q12H  . methotrexate INTRATHECAL (+/- HYDROCORTISONE,Ara-C)   Intrathecal Once  . pantoprazole  40 mg Oral Daily  . polyethylene glycol  17 g Oral Daily  . senna-docusate  2 tablet Oral QHS  . sodium bicarbonate/sodium chloride   Mouth Rinse QID  . Vitamin D (  Ergocalciferol)  50,000 Units Oral Q7 days   Continuous Infusions: . sodium chloride 20 mL/hr at 08/28/15 1037   PRN Meds:.sodium chloride, albuterol, alteplase, alteplase, Cold Pack, diphenhydrAMINE, diphenhydrAMINE, EPINEPHrine, EPINEPHrine, EPINEPHrine, EPINEPHrine, famotidine, fludeoxyglucose F - 18, heparin lock flush, heparin lock flush, heparin lock flush, heparin lock flush, heparin lock flush, heparin lock flush, Hot Pack, iohexol, methylPREDNISolone sodium succinate, morphine injection, promethazine, sodium chloride, sodium chloride, sodium chloride, sodium  chloride, sodium chloride, sodium chloride, traMADol, zolpidem  REVIEW OF SYSTEMS:    10 Point review of Systems was done is negative except as noted above.   LABORATORY DATA:  I have reviewed the data as listed  . CBC Latest Ref Rng 08/30/2015 08/29/2015 08/28/2015  WBC 4.0 - 10.5 K/uL 2.0(L) 2.0(L) 2.2(L)  Hemoglobin 12.0 - 15.0 g/dL 8.7(L) 8.5(L) 8.4(L)  Hematocrit 36.0 - 46.0 % 25.9(L) 25.6(L) 25.1(L)  Platelets 150 - 400 K/uL 117(L) 129(L) 123(L)    . CMP Latest Ref Rng 08/30/2015 08/29/2015 08/29/2015  Glucose 65 - 99 mg/dL 117(H) 144(H) 146(H)  BUN 6 - 20 mg/dL 21(H) 21(H) 21(H)  Creatinine 0.44 - 1.00 mg/dL 0.61 0.73 0.74  Sodium 135 - 145 mmol/L 137 138 137  Potassium 3.5 - 5.1 mmol/L 4.4 5.3(H) 5.3(H)  Chloride 101 - 111 mmol/L 102 104 103  CO2 22 - 32 mmol/L 29 28 29   Calcium 8.9 - 10.3 mg/dL 7.9(L) 8.1(L) 8.1(L)  Total Protein 6.5 - 8.1 g/dL 4.5(L) 4.7(L) -  Total Bilirubin 0.3 - 1.2 mg/dL 0.8 0.8 -  Alkaline Phos 38 - 126 U/L 58 62 -  AST 15 - 41 U/L 15 13(L) -  ALT 14 - 54 U/L 20 17 -   . Lab Results  Component Value Date   LDH 311* 08/30/2015     RADIOGRAPHIC STUDIES: I have personally reviewed the radiological images as listed and agreed with the findings in the report. Ct Chest Wo Contrast  08/19/2015  CLINICAL DATA:  81 year old female with abnormal CT Abdomen and Pelvis, possible lymphoma. Initial encounter. EXAM: CT CHEST WITHOUT CONTRAST TECHNIQUE: Multidetector CT imaging of the chest was performed following the standard protocol without IV contrast. COMPARISON:  CT Abdomen and Pelvis 08/18/2015. Chest and abdominal radiographs 08/18/2015. FINDINGS: Noncontrast exam. Stable abnormal inferior posterior mediastinum and retrocrural soft tissue as described recently. No pericardial effusion. No pleural effusion. Mediastinal and axillary lymph nodes are normal. Thoracic inlet lymph nodes are normal. Calcified aortic and coronary artery atherosclerosis.  Major airways are patent. There are occasional calcified granulomas. There is right upper lobe bronchiectasis with mild patchy peribronchial opacity along the major fissure (series 305, image 16). Mild tree-in-bud nodular opacity along the inferior lateral right upper lobe near the minor fissure (image 24). Minor dependent atelectasis. Degenerative changes in the thoracic spine. No acute or suspicious osseous lesion in the chest. IMPRESSION: 1. Abnormal posterior mediastinal/retrocrural soft tissue as seen on the recent CT Abdomen and Pelvis. Mediastinal, axillary, and thoracic inlet lymph nodes are normal. 2. Right upper lobe bronchiectasis and peripheral / peribronchial opacity compatible with distal airway infection or less likely scarring. Electronically Signed   By: Genevie Ann M.D.   On: 08/19/2015 23:39   [name] FU Contrast  08/19/2015  CLINICAL DATA:  Headache.  Possible lymphoma. EXAM: MRI HEAD WITHOUT AND WITH CONTRAST TECHNIQUE: Multiplanar, multiecho pulse sequences of the brain and surrounding structures were obtained without and with intravenous contrast. CONTRAST:  13mL MULTIHANCE GADOBENATE DIMEGLUMINE 529 MG/ML IV SOLN COMPARISON:  None. FINDINGS:  Ventricle size normal. Cerebral volume normal. Pituitary normal in size. Craniocervical junction normal. Negative for acute infarct. Minimal hyperintensity in the periventricular white matter may represent chronic ischemia. Negative for hemorrhage or fluid collection. Negative for edema in the brain Postcontrast imaging reveals leptomeningeal enhancement diffusely and bilaterally. There is mild smooth enhancement of the meninges without nodularity. No enhancing mass lesion in the brain. IMPRESSION: Diffuse leptomeningeal enhancement. Given the history, this is concerning for lymphomatous involvement. Lumbar puncture with cytology recommended. Negative for acute infarct or mass lesion. Electronically Signed   By: Franchot Gallo M.D.   On: 08/19/2015  20:47   Ct Abdomen Pelvis W Contrast  08/18/2015  CLINICAL DATA:  81 year old female with lower abdominal pain nausea and vomiting for 1 month. Initial encounter. EXAM: CT ABDOMEN AND PELVIS WITH CONTRAST TECHNIQUE: Multidetector CT imaging of the abdomen and pelvis was performed using the standard protocol following bolus administration of intravenous contrast. CONTRAST:  48mL OMNIPAQUE IOHEXOL 300 MG/ML SOLN, 166mL OMNIPAQUE IOHEXOL 300 MG/ML SOLN COMPARISON:  [facility] Chest CTA 11/20/2012. Acute abdominal series from today FINDINGS: Mild respiratory motion artifact at the lung bases. There are chronic surgical clips about the distal thoracic esophagus and gastroesophageal junction. New since 2014 in the posterior mediastinum and tracking toward the retrocrural space is abnormal confluent soft tissue anterior to the thoracic spine and inseparable from the medial wall of the descending thoracic aorta measuring 3 x 44 x 54 mm (AP by transverse by CC). This seems to be separate from both the aorta and esophagus. There is no anterior thoracic spine erosion. Calcified plaque along this segment of the aorta. No associated pericardial or pleural effusion. Mild bronchiectasis at both lung bases with no confluent pulmonary opacity. Degenerative changes in the spine. Chronic 10 mm lucent area in the left T12 vertebral body is stable and most resembled hemangioma in 2014. No acute or suspicious osseous lesion is identified however, there is subtle increased ventral epidural soft tissue seen in the sacrum -sagittal image 73. Mild to moderate nonspecific presacral stranding. No pelvic free fluid. Negative rectum with retained stool. Uterus surgically absent. Adnexa within normal limits. Numerous pelvic phleboliths. Negative urinary bladder. Redundant sigmoid colon. Proximal sigmoid and left colon diverticulosis with no active inflammation identified. Negative transverse colon. Negative right colon. Ileocecal valve  lipoma incidentally noted. Appendix diminutive or absent. Negative terminal ileum. No dilated or abnormal small bowel loops. Occasional surgical clips in the greater omentum. Diminutive stomach. Duodenum within normal limits. Major arterial structures are patent in the abdomen and pelvis with fairly extensive calcified aortic atherosclerosis. Portal venous system appears to be patent. Retroperitoneal lymphadenopathy maximal at the lower lumbar spine level anterior to the IVC measuring up to 22 mm short axis. Numerous increased para renal and other retroperitoneal space nodes are individually up to 14 mm short axis. Mesenteric nodes in the abdomen and pelvis have a more normal appearance. No pelvic sidewall or inguinal lymphadenopathy identified. Solitary small nonspecific low-density area in the right hepatic lobe measuring 10 mm on series 2, image 20, favor benign. Gallbladder not identified and felt to be surgically absent. No splenomegaly or splenic lesion. Negative pancreas and adrenal glands. Bilateral renal enhancement and contrast excretion within normal limits. No abdominal free fluid. IMPRESSION: 1. Retroperitoneal and lower posterior mediastinal / retrocrural soft tissue masses most compatible with lymphadenopathy, and most suggestive of Lymphoma. Some of these might be amenable to CT-guided biopsy, uncertain. 2. Subtle increased sacral epidural soft tissue, but no destructive osseous lesion  identified. Nonspecific presacral stranding. Metastatic disease to the spine not excluded. Electronically Signed   By: Genevie Ann M.D.   On: 08/18/2015 18:52   Nm Pet Image Initial (pi) Skull Base To Thigh  08/27/2015  CLINICAL DATA:  Initial treatment strategy for lymphoma. EXAM: NUCLEAR MEDICINE PET VERTEX TO THIGH TECHNIQUE: 8.75 mCi F-18 FDG was injected intravenously. Full-ring PET imaging was performed from the vertex to thigh after the radiotracer. CT data was obtained and used for attenuation correction and  anatomic localization. FASTING BLOOD GLUCOSE:  Value: 126 mg/dl COMPARISON:  08/19/2015 FINDINGS: HEAD AND NECK No abnormal radiotracer accumulation identified within the brain. A small amount of gas is noted within the anterior horn of the left lateral ventricle. No hypermetabolic lymph nodes in the neck. CHEST Hypermetabolic posterior mediastinal soft tissue at the level of the thigh for ppm attic hiatus is identified. This measures 2 x 2.5 cm and has an SUV max equal to 6.9. No enlarged or hypermetabolic mediastinal or hilar lymph nodes. No hypermetabolic axillary or supraclavicular adenopathy. No suspicious pulmonary nodules identified on the CT images. Calcified granuloma is identified within the posterior left upper lobe. ABDOMEN/PELVIS No abnormal hypermetabolic activity within the liver, pancreas, adrenal glands, or spleen. Prominent retroperitoneal lymph nodes are identified. Index pre caval node just above the aortic bifurcation measures 1.3 cm and has an SUV max equal to 3.50. Increased presacral soft tissue is identified which exhibits mild increased uptake. This measures 1.x 5.1 x 4.0 cm. The SUV max within this area is equal to 4.13. SKELETON Diffuse heterogeneous tracer activity is identified throughout the bone marrow of the axial and proximal appendicular skeleton. More focal areas of intense uptake are noted within the thoracic spine, lumbar spine and pelvis. For example cuff within the L3 vertebra there is an area of increased uptake which has an SUV max equal to 6.38. IMPRESSION: 1. Abnormal soft tissue within the posterior mediastinum exhibits malignant range FDG uptake and is compatible with clinical history of lymphoma. 2. There are a few enlarged retroperitoneal lymph nodes within the lower abdomen which exhibits nonspecific FDG uptake. Additionally, there is mild increased presacral soft tissue exhibiting low level FDG uptake. 3. Diffuse heterogeneous uptake throughout the bone marrow is  noted and is worrisome for lymphomatous involvement. Electronically Signed   By: Kerby Moors M.D.   On: 08/27/2015 11:50   Ir Fluoro Guide Cv Line Right  08/26/2015  CLINICAL DATA:  ACCESS FOR CHEMOTHERAPY, BURKITT'S LYMPHOMA EXAM: RIGHT INTERNAL JUGULAR SINGLE LUMEN POWER PORT CATHETER INSERTION Date:  10/17/201610/17/2016 11:08 am Radiologist:  M. Daryll Brod, MD Guidance:  Ultrasound and fluoroscopic FLUOROSCOPY TIME:  30 seconds, 3 mGy MEDICATIONS AND MEDICAL HISTORY: 2 g Ancefadministered within 1 hour of the procedure.3 mg Versed, 50 mcg fentanyl ANESTHESIA/SEDATION: 30 minutes CONTRAST:  None. COMPLICATIONS: None immediate PROCEDURE: Informed consent was obtained from the patient following explanation of the procedure, risks, benefits and alternatives. The patient understands, agrees and consents for the procedure. All questions were addressed. A time out was performed. Maximal barrier sterile technique utilized including caps, mask, sterile gowns, sterile gloves, large sterile drape, hand hygiene, and 2% chlorhexidine scrub. Under sterile conditions and local anesthesia, right internal jugular micropuncture venous access was performed. Access was performed with ultrasound. Images were obtained for documentation. A guide wire was inserted followed by a transitional dilator. This allowed insertion of a guide wire and catheter into the IVC. Measurements were obtained from the SVC / RA junction back to the right  IJ venotomy site. In the right infraclavicular chest, a subcutaneous pocket was created over the second anterior rib. This was done under sterile conditions and local anesthesia. 1% lidocaine with epinephrine was utilized for this. A 2.5 cm incision was made in the skin. Blunt dissection was performed to create a subcutaneous pocket over the right pectoralis major muscle. The pocket was flushed with saline vigorously. There was adequate hemostasis. The port catheter was assembled and checked for  leakage. The port catheter was secured in the pocket with two retention sutures. The tubing was tunneled subcutaneously to the right venotomy site and inserted into the SVC/RA junction through a valved peel-away sheath. Position was confirmed with fluoroscopy. Images were obtained for documentation. The patient tolerated the procedure well. No immediate complications. Incisions were closed in a two layer fashion with 4 - 0 Vicryl suture. Dermabond was applied to the skin. The port catheter was accessed, blood was aspirated followed by saline and heparin flushes. Needle was removed. A dry sterile dressing was applied. IMPRESSION: Ultrasound and fluoroscopically guided right internal jugular single lumen power port catheter insertion. Tip in the SVC/RA junction. Catheter ready for use. Electronically Signed   By: Jerilynn Mages.  Shick M.D.   On: 08/26/2015 11:44   Ir US Guide Vasc Access Right  08/26/2015  CLINICAL DATA:  ACCESS FOR CHEMOTHERAPY, BURKITT'S LYMPHOMA EXAM: RIGHT INTERNAL JUGULAR SINGLE LUMEN POWER PORT CATHETER INSERTION Date:  10/17/201610/17/2016 11:08 am Radiologist:  M. Daryll Brod, MD Guidance:  Ultrasound and fluoroscopic FLUOROSCOPY TIME:  30 seconds, 3 mGy MEDICATIONS AND MEDICAL HISTORY: 2 g Ancefadministered within 1 hour of the procedure.3 mg Versed, 50 mcg fentanyl ANESTHESIA/SEDATION: 30 minutes CONTRAST:  None. COMPLICATIONS: None immediate PROCEDURE: Informed consent was obtained from the patient following explanation of the procedure, risks, benefits and alternatives. The patient understands, agrees and consents for the procedure. All questions were addressed. A time out was performed. Maximal barrier sterile technique utilized including caps, mask, sterile gowns, sterile gloves, large sterile drape, hand hygiene, and 2% chlorhexidine scrub. Under sterile conditions and local anesthesia, right internal jugular micropuncture venous access was performed. Access was performed with ultrasound.  Images were obtained for documentation. A guide wire was inserted followed by a transitional dilator. This allowed insertion of a guide wire and catheter into the IVC. Measurements were obtained from the SVC / RA junction back to the right IJ venotomy site. In the right infraclavicular chest, a subcutaneous pocket was created over the second anterior rib. This was done under sterile conditions and local anesthesia. 1% lidocaine with epinephrine was utilized for this. A 2.5 cm incision was made in the skin. Blunt dissection was performed to create a subcutaneous pocket over the right pectoralis major muscle. The pocket was flushed with saline vigorously. There was adequate hemostasis. The port catheter was assembled and checked for leakage. The port catheter was secured in the pocket with two retention sutures. The tubing was tunneled subcutaneously to the right venotomy site and inserted into the SVC/RA junction through a valved peel-away sheath. Position was confirmed with fluoroscopy. Images were obtained for documentation. The patient tolerated the procedure well. No immediate complications. Incisions were closed in a two layer fashion with 4 - 0 Vicryl suture. Dermabond was applied to the skin. The port catheter was accessed, blood was aspirated followed by saline and heparin flushes. Needle was removed. A dry sterile dressing was applied. IMPRESSION: Ultrasound and fluoroscopically guided right internal jugular single lumen power port catheter insertion. Tip in the SVC/RA junction.  Catheter ready for use. Electronically Signed   By: Jerilynn Mages.  Shick M.D.   On: 08/26/2015 11:44   Ct Biopsy  08/21/2015  CLINICAL DATA:  Suspected lymphoma EXAM: CT-GUIDED BIOPSY BONE MARROW AND PARA-AORTIC LYMPH NODE. MEDICATIONS AND MEDICAL HISTORY: Versed 2 mg, Fentanyl 100 mcg. Additional Medications: None. ANESTHESIA/SEDATION: Moderate sedation time: Third minutes PROCEDURE: The procedure, risks, benefits, and alternatives were  explained to the patient. Questions regarding the procedure were encouraged and answered. The patient understands and consents to the procedure. The back was prepped with Betadine in a sterile fashion, and a sterile drape was applied covering the operative field. A sterile gown and sterile gloves were used for the procedure. Under CT guidance, an 11 gauge needle was inserted into the right iliac bone via posterior approach. Aspirates and a core were obtained Under CT guidance, an 17 gauge needle was inserted into the para caval lymph node via right posterior lateral approach. Four 18 gauge core biopsies were obtained. The guide needle was removed. Patient tolerated the procedure well without complication. Vital sign monitoring by nursing staff during the procedure will continue as patient is in the special procedures unit for post procedure observation. FINDINGS: The images document guide needle placement within the right iliac bone and pericaval lymph node. Post biopsy images demonstrate no hemorrhage. COMPLICATIONS: None IMPRESSION: Successful CT-guided bone marrow aspirate, bone marrow core, and pericaval lymph node core which was placed in saline for lymphoma flow analysis. Electronically Signed   By: Marybelle Killings M.D.   On: 08/21/2015 14:10   Ct Biopsy  08/21/2015  CLINICAL DATA:  Suspected lymphoma EXAM: CT-GUIDED BIOPSY BONE MARROW AND PARA-AORTIC LYMPH NODE. MEDICATIONS AND MEDICAL HISTORY: Versed 2 mg, Fentanyl 100 mcg. Additional Medications: None. ANESTHESIA/SEDATION: Moderate sedation time: Third minutes PROCEDURE: The procedure, risks, benefits, and alternatives were explained to the patient. Questions regarding the procedure were encouraged and answered. The patient understands and consents to the procedure. The back was prepped with Betadine in a sterile fashion, and a sterile drape was applied covering the operative field. A sterile gown and sterile gloves were used for the procedure. Under CT  guidance, an 11 gauge needle was inserted into the right iliac bone via posterior approach. Aspirates and a core were obtained Under CT guidance, an 17 gauge needle was inserted into the para caval lymph node via right posterior lateral approach. Four 18 gauge core biopsies were obtained. The guide needle was removed. Patient tolerated the procedure well without complication. Vital sign monitoring by nursing staff during the procedure will continue as patient is in the special procedures unit for post procedure observation. FINDINGS: The images document guide needle placement within the right iliac bone and pericaval lymph node. Post biopsy images demonstrate no hemorrhage. COMPLICATIONS: None IMPRESSION: Successful CT-guided bone marrow aspirate, bone marrow core, and pericaval lymph node core which was placed in saline for lymphoma flow analysis. Electronically Signed   By: Marybelle Killings M.D.   On: 08/21/2015 14:10   Dg Abd Acute W/chest  08/18/2015  CLINICAL DATA:  Chest pain and abdominal pain. EXAM: DG ABDOMEN ACUTE W/ 1V CHEST COMPARISON:  Chest x-ray dated 12/12/2012 performed at [place]: Heart size and pulmonary vascularity are normal and the lungs are clear. Surgical clips at the gastroesophageal junction. Slight thoracolumbar scoliosis. No free air or free fluid in the abdomen. Bowel gas pattern is normal. Surgical clips and staples in the abdomen. Multiple phleboliths in the pelvis. No acute osseous abnormality. IMPRESSION: Negative abdominal radiographs.  No acute cardiopulmonary disease. Electronically Signed   By: Lorriane Shire M.D.   On: 08/18/2015 17:17   Dg Fluoro Guide Lumbar Puncture  08/30/2015  CLINICAL DATA:  History of Burkitt's lymphoma. Diagnostic lumbar puncture with intrathecal chemotherapy injection. Subsequent encounter. EXAM: DIAGNOSTIC LUMBAR PUNCTURE UNDER FLUOROSCOPIC GUIDANCE FLUOROSCOPY TIME:  Radiation Exposure Index (as provided by the fluoroscopic  device): If the device does not provide the exposure index: Fluoroscopy Time (in minutes and seconds):  44 seconds Number of Acquired Images:  0. PROCEDURE: Informed consent was obtained from the patient prior to the procedure, including potential complications of headache, allergy, and pain. With the patient prone, the lower back was prepped with Betadine. 1% Lidocaine was used for local anesthesia. Lumbar puncture was performed at the L5-S1 level using a 20 gauge needle with return of initially blood tinged but clearing. Fourteen ml of CSF were obtained for laboratory studies. Laboratory prepared methotrexate was injected into the subarachnoid space without difficulty. The patient tolerated the procedure well and there were no apparent complications. IMPRESSION: Diagnostic lumbar puncture and intrathecal injection of methotrexate without complication. Electronically Signed   By: Inge Rise M.D.   On: 08/30/2015 15:44   Dg Fluoro Guide Lumbar Puncture  08/26/2015  CLINICAL DATA:  Diagnostic lumbar puncture with intrathecal chemotherapy injection EXAM: FLUOROSCOPICALLY GUIDED LUMBAR PUNCTURE FOR INTRATHECAL CHEMOTHERAPY FLUOROSCOPY TIME:  dictate in minutes and seconds Radiation Exposure Index (as provided by the fluoroscopic device): Not available on this device If the device does not provide the exposure index: Fluoroscopy Time (in minutes and seconds):  1 minutes 32 seconds Number of Acquired Images:  1 PROCEDURE: Informed consent was obtained from the patient prior to the procedure, including potential complications of headache, allergy, and pain. With the patient prone, the lower back was prepped with Betadine. 1% Lidocaine was used for local anesthesia. Lumbar puncture was performed at the L3-4 level without success, likely due to ligamentous ossification. After numbing, attempt was then made at the L2-3 level, with dry tap despite good needle placement. After numbing, needle placed at the L5-S1  level with easy access to somewhat low pressure CSF (patient NPO for port placement this am). A 20 gauge needle was used. 11 cc of clear CSF were obtained for laboratory studies. The entire syringe of laboratory prepared methotrexate was then injected into the subarachnoid space. The patient tolerated the procedure well without apparent complication. IMPRESSION: Diagnostic lumbar puncture and intrathecal injection of chemotherapy without complication. Electronically Signed   By: Monte Fantasia M.D.   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 Patient had significant type B constitutional symptoms which have now resolved . MRI Brain with evidence of leptomeningeal involvement consistent with the patients symptoms of chin numbnes though LP was unrevealing. BM packed with lymphoma LDH down from 3000's to 1900 to 1215 to 500's to 368 to 352 to 311  2) Chin numbness due to possible leptomeningeal involvement. Notes headaches on and off with no other overt focal neurological deficits. Patient notes headaches have resolved. 3) Thrombocytopenia likely related to lymphoma. Platelets 115k after transfusion yesterday. 4) Leukoerythroblastic picture likely from bone marrow Lymphoma involvement. - resolved 4) Abdominal fatigue/back pain -  likely from retroperitoneal LNadenopathy - back pain improved. 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  Plan -tolerating EPOCH-Rcompleted. - 2nd dose IT Methotrexate received today. -Neulasta in clinic on Monday 09/02/2015 as per  appointments below. -would discharge on dexamethasone 4mg  po daily with PPI till she is seen in clinic on 09/09/2015 then will taper off. -next IT methotrexate as outpatient on 09/09/2015. -clinic f/u on 09/09/2015 with labs. -next cycle of inpatient EPOCH-R on 09/16/2015 -we will rpt MRI brain prior to cycle 2. -Would discharge him allopurinol. We'll discontinue if no evidence of tumor lysis on repeat labs on 09/09/2015 . - when necessary Compazine for nausea/vomiting on discharge since she is intolerant of Zofran . - continue vitamin D weekly  - Patient, son and daughter were given detailed instructions about infection prevention, red signs to watch for including fevers chills, increasing headaches, new focal neurological deficits, change in mental status, seizures. - patient was recommended not to drive . - patient will likely be discharged tomorrow morning to home with home care services /home safety evaluation.      kindly call if any additional questions. Will follow-up in clinic on 09/09/2015.  patient stable for discharge tomorrow morning from an oncologic standpoint.  Appreciate excellent care by hospitalist team and 3W Nurses.  Sullivan Lone MD [place] AAHIVMS [facility] Hematology/Oncology Physician [place]  (Office):       [phone] (Work cell):  [phone] (Fax):           [phone]

## 2015-08-30 NOTE — Progress Notes (Signed)
Occupational Therapy Treatment Patient Details Name: Kristen Baker MRN: [id number] DOB: Aug 30, 1933 Today's Date: 08/30/2015    History of present illness Patient is a 81 y/o female presens with 1 month history of progressive nausea, vomiting, night sweats, weight loss, lower abdominal pain, headache, chin numbness, and easy brusing. Labs were notable for platelet count of 34; CT of her abdomen and pelvis showed retroperitoneal and lower posterior mediastinal lymphadenopathy suggestive of lymphoma, and a subtle increased sacral epidural soft tissue that cannot be excluded as a spinal metastasis. PMH of cervical ca. Awaiting peripheral blood flow cytometry, Bone Marrow and LN biopsy results 10/13.  Pt s/p port-a-cath placement and started chemotherapy 08/26/15.   OT comments  Pt progressing well.  Currently, she requires supervision with ADLs, but will quickly progress to modified independence.  She is eager to discharge home.   Follow Up Recommendations  Home health OT;Supervision - Intermittent    Equipment Recommendations       Recommendations for Other Services      Precautions / Restrictions Precautions Precautions: Fall Precaution Comments: chemo       Mobility Bed Mobility Overal bed mobility: Independent                Transfers Overall transfer level: Modified independent                    Balance Overall balance assessment: Needs assistance Sitting-balance support: Feet supported Sitting balance-Leahy Scale: Good     Standing balance support: During functional activity Standing balance-Leahy Scale: Fair                     ADL Overall ADL's : Needs assistance/impaired     Grooming: Supervision/safety;Wash/dry hands;Wash/dry face;Oral care;Brushing hair;Standing   Upper Body Bathing: Set up   Lower Body Bathing: Supervison/ safety;Sit to/from stand   Upper Body Dressing : Set up;Sitting;Standing   Lower Body Dressing: Set  up;Supervision/safety;Sit to/from stand   Toilet Transfer: Supervision/safety;Regular Toilet;RW;Ambulation   Toileting- Water quality scientist and Hygiene: Supervision/safety;Sit to/from stand       Functional mobility during ADLs: Supervision/safety;Rolling walker General ADL Comments: requires supervision due to IV pole.  She will quickly progress to mod I once lines disonnected       Vision                     Perception     Praxis      Cognition   Behavior During Therapy: [facility] for tasks assessed/performed Overall Cognitive Status: Within Functional Limits for tasks assessed                       Extremity/Trunk Assessment               Exercises     Shoulder Instructions       General Comments      Pertinent Vitals/ Pain       Pain Assessment: No/denies pain  Home Living                                          Prior Functioning/Environment              Frequency Min 2X/week     Progress Toward Goals  OT Goals(current goals can now be found in the care plan section)  Progress towards OT goals: Progressing toward  goals     Plan Discharge plan remains appropriate    Co-evaluation                 End of Session Equipment Utilized During Treatment: Rolling walker   Activity Tolerance Patient tolerated treatment well   Patient Left in bed;with call bell/phone within reach   Nurse Communication Mobility status        Time: 9470-9628 OT Time Calculation (min): 12 min  Charges: OT General Charges $OT Visit: 1 Procedure OT Treatments $Therapeutic Activity: 8-22 mins  Darria Corvera M 08/30/2015, 9:47 AM

## 2015-08-30 NOTE — Care Management Note (Signed)
Case Management Note  Patient Details  Name: Manya Balash MRN: [id number] Date of Birth: 03-03-1933    Expected Discharge Date:                  Expected Discharge Plan:  [place]  In-House Referral:     Discharge planning Services  CM Consult  Post Acute Care Choice:  Home Health Choice offered to:  Patient  DME Arranged:  3-N-1, Walker rolling DME Agency:  [place]:  PT,RN,AIDE [facility] Agency:     Status of Service:  In process, will continue to follow  Medicare Important Message Given:  Yes-fourth notification given Date Medicare IM Given:    Medicare IM give by:    Date Additional Medicare IM Given:    Additional Medicare Important Message give by:     If discussed at [place] of Stay Meetings, dates discussed:    Additional Comments:   This CM was called to room by pt daughter. Pt daughter and son anxious about pt going home alone without someone checking on her. Son plans to be with her most of the day but they are nervous about her declining as she receives her chemotherapy. This CM suggested HHRN and aide to monitor pt at home. Choice was offered and pt chose [facility]. [facility] rep contacted for referral. CM will continue to follow.  Lynnell Catalan, RN 08/30/2015, 3:45 PM

## 2015-08-30 NOTE — Progress Notes (Signed)
PROGRESS NOTE  Kristen Baker DHW:[id number] DOB: 05-Oct-1933 DOA: 08/18/2015 PCP: No primary care provider on file.  HPI/Recap of past 72 hours: 81 year old female with past medical history of GERD and B-12 deficiency in 2-3 months of weight loss as well as previous cervical cancer admitted 10/9 for abdominal pain and with workup found to have retroperitoneal lymphadenopathy, thrombocytopenia. Bone marrow and lymph nodes biopsied noting Burkitt's lymphoma. Patient started on chemotherapy.  Patient feeling well this AM. She had a good night of sleep. She denies chest pain or dyspnea. No Headaches.   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 Replaced.    Constipation: Resolved with medication. Had BM 10-21   Burkitt lymphoma of lymph nodes of multiple regions [facility])  with secondary thrombocytopenia, though meningeal disease:  -Seen by oncology.  -Started on allopurinol, IV dexamethasone to prevent tumor lysis as well as chemotherapy. - Patient was given a transfusion of platelets done 10/17.  -Plan is for intrathecal injection of methotrexate during spinal tap 10-21. - Received one unit PRBC 10-20. -Decadron decreased to 4 mg IV BID 10-20. See oncology recommendation for decadron at discharge/     AKI (acute kidney injury) [facility]): secondary dehydration. With IV fluids, normal     Code Status: Full code  Family Communication: care discussed with patient.   Disposition Plan: Potential discharge Saturday with immediate follow-up at the cancer center   Consultants:  Oncology  Interventional radiology  Procedures:  Status post transfusion of platelets 10/17  Status post core needle biopsy done  10/12 noting lymphoma  Placement of Port-A-Cath  Antibiotics:  IV Ancef 1 dose 10/17   Objective: BP 142/50 mmHg  Pulse 63  Temp(Src) 98 F (36.7 C) (Oral)  Resp 16  Ht 5' 7.2" (1.707 m)  Wt 82.8 kg (182 lb 8.7 oz)  BMI 28.42 kg/m2  SpO2 96%  Intake/Output Summary (Last 24 hours) at 08/30/15 0949 Last data filed at 08/30/15 0911  Gross per 24 hour  Intake   1300 ml  Output   2900 ml  Net  -1600 ml   Filed Weights   08/26/15 0423 08/29/15 0500 08/30/15 0507  Weight: 79.516 kg (175 lb 4.8 oz) 83.099 kg (183 lb 3.2 oz) 82.8 kg (182 lb 8.7 oz)    Exam:   General:  Alert and oriented 3, NAD  Cardiovascular: Regular rate and rhythm, S1 and S2  Respiratory: Clear to auscultation bilaterally  Abdomen: Soft, nontender, distended, BS present  Musculoskeletal: No clubbing or cyanosis, trace edema   Data Reviewed: Basic Metabolic Panel:  Recent Labs Lab 08/26/15 0426 08/27/15 0426 08/27/15 1215 08/28/15 0420 08/29/15 0437 08/30/15 0550  NA 135 136 135 135  136 138  137 137  K 4.7 4.7 4.7 4.6  4.7 5.3*  5.3* 4.4  CL 101 103 102 104  105 104  103 102  CO2 27 26 27 25  26 28  29 29   GLUCOSE 158* 159* 132* 144*  144* 144*  146* 117*  BUN 25* 24* 23* 22*  22* 21*  21* 21*  CREATININE 0.74 0.68 0.67 0.63  0.62 0.73  0.74 0.61  CALCIUM 8.2* 8.0* 8.0* 7.8*  7.8* 8.1*  8.1* 7.9*  PHOS 4.3 4.2  --  4.0 3.8 3.3   Liver Function Tests:  Recent Labs Lab 08/26/15 0426 08/27/15 0426 08/27/15 1215 08/28/15 0420 08/29/15 0437 08/30/15 0550  AST 19  --  11* 11* 13* 15  ALT 28  --  20 18 17 20   ALKPHOS 63  --  70 63 62 58  BILITOT 0.6  --  0.5 0.7 0.8 0.8  PROT 5.2*  --  5.5* 4.6* 4.7* 4.5*  ALBUMIN 3.1* 3.2* 3.2* 2.8*  2.8* 2.8*  2.8* 2.7*   No results for input(s): LIPASE, AMYLASE in the last 168 hours. No results for input(s): AMMONIA in the last 168 hours. CBC:  Recent Labs Lab 08/24/15 0515 08/26/15 0426 08/27/15 1215 08/28/15 0420  08/29/15 0437 08/30/15 0550  WBC 2.9* 2.4* 2.8* 2.2* 2.0* 2.0*  NEUTROABS 1.5*  --  1.9 1.5* 1.5* 1.7  HGB 10.0* 9.2* 9.9* 8.4* 8.5* 8.7*  HCT 29.8* 27.9* 30.1* 25.1* 25.6* 25.9*  MCV 87.1 88.0 87.8 87.8 88.6 86.9  PLT 38* 115* 135* 123* 129* 117*   Cardiac Enzymes:    Recent Labs Lab 08/28/15 1755 08/29/15 0437  TROPONINI <0.03 <0.03   BNP (last 3 results) No results for input(s): BNP in the last 8760 hours.  ProBNP (last 3 results) No results for input(s): PROBNP in the last 8760 hours.  CBG:  Recent Labs Lab 08/27/15 0925  GLUCAP 126*    Recent Results (from the past 240 hour(s))  CSF culture with Stat gram stain     Status: None   Collection Time: 08/20/15  4:55 PM  Result Value Ref Range Status   Specimen Description CSF  Final   Special Requests NONE  Final   Gram Stain   Final    WBC PRESENT, PREDOMINANTLY MONONUCLEAR NO ORGANISMS SEEN CYTOSPIN    Culture NO GROWTH 3 DAYS  Final   Report Status 08/23/2015 FINAL  Final  Gram stain     Status: None   Collection Time: 08/26/15  2:22 PM  Result Value Ref Range Status   Specimen Description CSF  Final   Special Requests NONE  Final   Gram Stain   Final    NO ORGANISMS SEEN WBC SEEN Gram Stain Report Called to,Read Back By and Verified With: D TORRES AT 1517 ON 10.17.2016 BY NBROOKS    Report Status 08/26/2015 FINAL  Final     Studies: No results found.  Scheduled Meds: . sodium chloride   Intravenous Once  . sodium chloride   Intravenous Once  . allopurinol  100 mg Oral BID  . dexamethasone  4 mg Intravenous Q12H  . methotrexate INTRATHECAL (+/- HYDROCORTISONE,Ara-C)   Intrathecal Once  . pantoprazole  40 mg Oral Daily  . polyethylene glycol  17 g Oral Daily  . senna-docusate  2 tablet Oral QHS  . sodium bicarbonate/sodium chloride   Mouth Rinse QID  . Vitamin D (Ergocalciferol)  50,000 Units Oral Q7 days    Continuous Infusions: . sodium chloride 20 mL/hr at 08/28/15 1037     Time  spent: 15 minutes  Kristen Baker, [place] [facility] Pager [phone]. If 7PM-7AM, please contact night-coverage at [email or web address], password [facility] 08/30/2015, 9:49 AM  LOS: 10 days

## 2015-08-30 NOTE — Telephone Encounter (Signed)
per pof to sch pt appt-per Loren to sch @ 3 for lab and 4:00 slot w/Kale-pt in hospital and will get appt @ discharge

## 2015-08-31 DIAGNOSIS — Z9189 Other specified personal risk factors, not elsewhere classified: Secondary | ICD-10-CM

## 2015-08-31 LAB — COMPREHENSIVE METABOLIC PANEL
ALBUMIN: 2.8 g/dL — AB (ref 3.5–5.0)
ALK PHOS: 61 U/L (ref 38–126)
ALT: 18 U/L (ref 14–54)
AST: 14 U/L — AB (ref 15–41)
Anion gap: 4 — ABNORMAL LOW (ref 5–15)
BILIRUBIN TOTAL: 0.6 mg/dL (ref 0.3–1.2)
BUN: 16 mg/dL (ref 6–20)
CO2: 30 mmol/L (ref 22–32)
Calcium: 8 mg/dL — ABNORMAL LOW (ref 8.9–10.3)
Chloride: 104 mmol/L (ref 101–111)
Creatinine, Ser: 0.52 mg/dL (ref 0.44–1.00)
GFR calc Af Amer: >60 mL/min (ref >60)
GFR calc non Af Amer: >60 mL/min (ref >60)
GLUCOSE: 126 mg/dL — AB (ref 65–99)
POTASSIUM: 4.5 mmol/L (ref 3.5–5.1)
Sodium: 138 mmol/L (ref 135–145)
TOTAL PROTEIN: 4.6 g/dL — AB (ref 6.5–8.1)

## 2015-08-31 LAB — CBC WITH DIFFERENTIAL/PLATELET
BASOS ABS: 0 10*3/uL (ref 0.0–0.1)
BASOS PCT: 0 %
Eosinophils Absolute: 0 10*3/uL (ref 0.0–0.7)
Eosinophils Relative: 0 %
HEMATOCRIT: 26.6 % — AB (ref 36.0–46.0)
HEMOGLOBIN: 8.8 g/dL — AB (ref 12.0–15.0)
Lymphocytes Relative: 14 %
Lymphs Abs: 0.4 10*3/uL — ABNORMAL LOW (ref 0.7–4.0)
MCH: 29.3 pg (ref 26.0–34.0)
MCHC: 33.1 g/dL (ref 30.0–36.0)
MCV: 88.7 fL (ref 78.0–100.0)
Monocytes Absolute: 0 10*3/uL — ABNORMAL LOW (ref 0.1–1.0)
Monocytes Relative: 0 %
NEUTROS ABS: 2.3 10*3/uL (ref 1.7–7.7)
NEUTROS PCT: 86 %
Platelets: 121 10*3/uL — ABNORMAL LOW (ref 150–400)
RBC: 3 MIL/uL — ABNORMAL LOW (ref 3.87–5.11)
RDW: 15.9 % — AB (ref 11.5–15.5)
WBC: 2.7 10*3/uL — AB (ref 4.0–10.5)

## 2015-08-31 LAB — LACTATE DEHYDROGENASE: LDH: 306 U/L — ABNORMAL HIGH (ref 98–192)

## 2015-08-31 LAB — URIC ACID: Uric Acid, Serum: 2.6 mg/dL (ref 2.3–6.6)

## 2015-08-31 MED ORDER — ALLOPURINOL 100 MG PO TABS: 100.0000 mg | ORAL_TABLET | Freq: Two times a day (BID) | ORAL | Status: DC

## 2015-08-31 MED ORDER — HEPARIN SOD (PORK) LOCK FLUSH 100 UNIT/ML IV SOLN: 500.0000 [IU] | Freq: Once | INTRAVENOUS | Status: DC

## 2015-08-31 MED ORDER — TRAMADOL HCL 50 MG PO TABS
50.0000 mg | ORAL_TABLET | Freq: Four times a day (QID) | ORAL | Status: DC | PRN
Start: 2015-08-31 — End: 2015-09-30

## 2015-08-31 MED ORDER — PROCHLORPERAZINE MALEATE 10 MG PO TABS: 10.0000 mg | ORAL_TABLET | Freq: Four times a day (QID) | ORAL | Status: DC | PRN

## 2015-08-31 MED ORDER — DEXAMETHASONE 4 MG PO TABS: 4.0000 mg | ORAL_TABLET | Freq: Every day | ORAL | Status: DC

## 2015-08-31 NOTE — Discharge Summary (Signed)
Discharge Summary  Kristen Baker MWN:[id number] DOB: [birth year]  PCP: No primary care provider on file., patient will follow-up with internal medicine teaching service clinic  Admit date: 08/18/2015 Discharge date: 08/31/2015  Time spent: 25 minutes  Recommendations for Outpatient Follow-up:  1. New medication: Dexamethasone 4 mg by mouth daily (taper will start as per oncology after 10/31) 2. New medication: Allopurinol 200 mg by mouth twice a day (to be discontinued if no evidence of tumor lysis on 10/31 labs) 3. Patient has follow up appointment at regional [facility] at 1 PM on Monday 10/24 for Neulasta injection 4. Patient has formal full follow-up appointment at regional [facility] on Monday 10/31 with intrathecal methotrexate to be given 5. Patient is to have repeat MRI of the brain done prior to second round of chemotherapy which will be initiated on 11/7 6. New medication: Compazine as needed  Discharge Diagnoses:  Active Hospital Problems   Diagnosis Date Noted  . Burkitt lymphoma ([place])   . Anemia due to other cause   . Hyperkalemia   . Chemotherapy management, encounter for   . AKI (acute kidney injury) ([place]) 08/27/2015  . 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 Leptomeningeal disease   . Encounter for antineoplastic chemotherapy   . Constipation   . At high risk of tumor lysis syndrome   . Numbness of face   . Headache   . Thrombocytopenia ([place]) 08/18/2015  . Generalized weakness 08/18/2015  . Weight loss 08/18/2015  . Vitamin B12 deficiency 08/18/2015  . GERD (gastroesophageal reflux disease) 08/18/2015  . Hx of gastric ulcer 08/18/2015  . Hx of cervical cancer 08/18/2015  . Hypokalemia 08/18/2015    Resolved Hospital Problems   Diagnosis Date Noted Date Resolved  No resolved problems to display.    Discharge Condition: Improved, being discharged home with home health PT/OT and RN  Diet recommendation: Regular diet  Filed  Weights   08/29/15 0500 08/30/15 0507 08/31/15 0500  Weight: 83.099 kg (183 lb 3.2 oz) 82.8 kg (182 lb 8.7 oz) 83.7 kg (184 lb 8.4 oz)    History of present illness:  81 year old female with past medical history of GERD and B-12 deficiency in 2-3 months of weight loss as well as previous cervical cancer admitted 10/9 for abdominal pain  During patient's workup, she was found to have retroperitoneal lymphadenopathy and thrombocytopenia. She underwent biopsies of the bone marrow and lymph nodes and found to have Burkitt's lymphoma. Patient was transferred to [facility] long hospital from the teaching service to the hospitalist service and seen by oncology which she was started on rounds of chemotherapy.  During this time, patient has responded well. She was given doses of The here in the hospital. She also received intrathecal IV methotrexate following chemotherapy regimen. She required a platelet transfusion on 10/17. Started on allopurinol and IV dexamethasone for prevention of tumor lysis which so far has not occurred.  Consultants:  Oncology  Interventional radiology  Procedures:  Status post transfusion of platelets 10/17  Status post core needle biopsy done 10/12 noting lymphoma  Placement of Port-A-Cath  Discharge Exam: BP 148/58 mmHg  Pulse 59  Temp(Src) 97.6 F (36.4 C) (Oral)  Resp 16  Ht 5' 7.2" (1.707 m)  Wt 83.7 kg (184 lb 8.4 oz)  BMI 28.72 kg/m2  SpO2 98%  General: Alert and oriented 3 Cardiovascular: Regular rate and rhythm, S1-S2 Respiratory: Clear to auscultation bilaterally  Discharge Instructions You were cared for by a  hospitalist during your hospital stay. If you have any questions about your discharge medications or the care you received while you were in the hospital after you are discharged, you can call the unit and asked to speak with the hospitalist on call if the hospitalist that took care of you is not available. Once you are discharged, your primary  care physician will handle any further medical issues. Please note that NO REFILLS for any discharge medications will be authorized once you are discharged, as it is imperative that you return to your primary care physician (or establish a relationship with a primary care physician if you do not have one) for your aftercare needs so that they can reassess your need for medications and monitor your lab values.  Discharge Instructions    Type and screen    Complete by:  Aug 29, 2015   [facility] order/instruction    Complete by:  As directed   Transfuse Parameters     Complete patient signature process for consent form    Complete by:  As directed      Diet general    Complete by:  As directed      Increase activity slowly    Complete by:  As directed      [place]    Complete by:  As directed   Hepatitis B Virus screening with HBsAg and anti-HBc recommended prior to treatment with rituximab (Rituxan), ofatumumab (Arzerra) or obinutuzumab Dyann Kief).     Practitioner attestation of consent    Complete by:  As directed   I, the ordering practitioner, attest that I have discussed with the patient the benefits, risks, side effects, alternatives, likelihood of achieving goals and potential problems during recovery for the procedure listed.  Procedure:  Blood Product(s)     SCHEDULING COMMUNICATION    Complete by:  As directed   Chemotherapy Appointment - 5 hours            Medication List    TAKE these medications        allopurinol 100 MG tablet  Commonly known as:  ZYLOPRIM  Take 1 tablet (100 mg total) by mouth 2 (two) times daily.     dexamethasone 4 MG tablet  Commonly known as:  DECADRON  Take 1 tablet (4 mg total) by mouth daily.     MOTION SICKNESS PO  Take 1 tablet by mouth daily as needed. For motion sickness per patient     naproxen sodium 220 MG tablet  Commonly known as:  ANAPROX  Take 220 mg by mouth daily as  needed. For pain     omeprazole 40 MG capsule  Commonly known as:  PRILOSEC  Take 40 mg by mouth daily.     prochlorperazine 10 MG tablet  Commonly known as:  COMPAZINE  Take 1 tablet (10 mg total) by mouth every 6 (six) hours as needed for nausea or vomiting.     traMADol 50 MG tablet  Commonly known as:  ULTRAM  Take 1 tablet (50 mg total) by mouth every 6 (six) hours as needed for moderate pain.     Vitamin D (Ergocalciferol) 50000 UNITS Caps capsule  Commonly known as:  DRISDOL  Take 50,000 Units by mouth every 7 (seven) days. Take on Tuesdays       Allergies  Allergen Reactions  . Cabbage     unknown  . Onion     unknown  .  Shellfish Allergy     Cant breath   . Sulfa Antibiotics     Rash   . Zofran [Ondansetron Hcl] Nausea And Vomiting       Follow-up Information    Follow up with [facility].   Why:  Home Health RN and aide.  Will call 24 to 48 hours after discharge.   Contact information:   [address] [place] [address] [phone]       Follow up with Sullivan Lone, MD On 09/02/2015.   Specialties:  Hematology, Oncology   Why:  Need a Neulasta injection.  Office will call you Monday morning.  Call them if you haven't been called by 10am   Contact information:   [place] [address] [phone]       Follow up with Sullivan Lone, MD On 09/09/2015.   Specialties:  Hematology, Oncology   Why:  Lab work, formal appointment followup & spinal tap with methotrexate   Contact information:   [place] [address] [phone]        The results of significant diagnostics from this hospitalization (including imaging, microbiology, ancillary and laboratory) are listed below for reference.    Significant Diagnostic Studies: Ct Chest Wo Contrast  08/19/2015  CLINICAL DATA:  81 year old female with abnormal CT Abdomen and Pelvis, possible lymphoma. Initial encounter. EXAM: CT CHEST WITHOUT CONTRAST  TECHNIQUE: Multidetector CT imaging of the chest was performed following the standard protocol without IV contrast. COMPARISON:  CT Abdomen and Pelvis 08/18/2015. Chest and abdominal radiographs 08/18/2015. FINDINGS: Noncontrast exam. Stable abnormal inferior posterior mediastinum and retrocrural soft tissue as described recently. No pericardial effusion. No pleural effusion. Mediastinal and axillary lymph nodes are normal. Thoracic inlet lymph nodes are normal. Calcified aortic and coronary artery atherosclerosis. Major airways are patent. There are occasional calcified granulomas. There is right upper lobe bronchiectasis with mild patchy peribronchial opacity along the major fissure (series 305, image 16). Mild tree-in-bud nodular opacity along the inferior lateral right upper lobe near the minor fissure (image 24). Minor dependent atelectasis. Degenerative changes in the thoracic spine. No acute or suspicious osseous lesion in the chest. IMPRESSION: 1. Abnormal posterior mediastinal/retrocrural soft tissue as seen on the recent CT Abdomen and Pelvis. Mediastinal, axillary, and thoracic inlet lymph nodes are normal. 2. Right upper lobe bronchiectasis and peripheral / peribronchial opacity compatible with distal airway infection or less likely scarring. Electronically Signed   By: Genevie Ann M.D.   On: 08/19/2015 23:39   [name] NO Contrast  08/19/2015  CLINICAL DATA:  Headache.  Possible lymphoma. EXAM: MRI HEAD WITHOUT AND WITH CONTRAST TECHNIQUE: Multiplanar, multiecho pulse sequences of the brain and surrounding structures were obtained without and with intravenous contrast. CONTRAST:  31mL MULTIHANCE GADOBENATE DIMEGLUMINE 529 MG/ML IV SOLN COMPARISON:  None. FINDINGS: Ventricle size normal. Cerebral volume normal. Pituitary normal in size. Craniocervical junction normal. Negative for acute infarct. Minimal hyperintensity in the periventricular white matter may represent chronic ischemia. Negative for  hemorrhage or fluid collection. Negative for edema in the brain Postcontrast imaging reveals leptomeningeal enhancement diffusely and bilaterally. There is mild smooth enhancement of the meninges without nodularity. No enhancing mass lesion in the brain. IMPRESSION: Diffuse leptomeningeal enhancement. Given the history, this is concerning for lymphomatous involvement. Lumbar puncture with cytology recommended. Negative for acute infarct or mass lesion. Electronically Signed   By: Franchot Gallo M.D.   On: 08/19/2015 20:47   Ct Abdomen Pelvis W  Contrast  08/18/2015  CLINICAL DATA:  81 year old female with lower abdominal pain nausea and vomiting for 1 month. Initial encounter. EXAM: CT ABDOMEN AND PELVIS WITH CONTRAST TECHNIQUE: Multidetector CT imaging of the abdomen and pelvis was performed using the standard protocol following bolus administration of intravenous contrast. CONTRAST:  27mL OMNIPAQUE IOHEXOL 300 MG/ML SOLN, 110mL OMNIPAQUE IOHEXOL 300 MG/ML SOLN COMPARISON:  [facility] Chest CTA 11/20/2012. Acute abdominal series from today FINDINGS: Mild respiratory motion artifact at the lung bases. There are chronic surgical clips about the distal thoracic esophagus and gastroesophageal junction. New since 2014 in the posterior mediastinum and tracking toward the retrocrural space is abnormal confluent soft tissue anterior to the thoracic spine and inseparable from the medial wall of the descending thoracic aorta measuring 3 x 44 x 54 mm (AP by transverse by CC). This seems to be separate from both the aorta and esophagus. There is no anterior thoracic spine erosion. Calcified plaque along this segment of the aorta. No associated pericardial or pleural effusion. Mild bronchiectasis at both lung bases with no confluent pulmonary opacity. Degenerative changes in the spine. Chronic 10 mm lucent area in the left T12 vertebral body is stable and most resembled hemangioma in 2014. No acute or suspicious  osseous lesion is identified however, there is subtle increased ventral epidural soft tissue seen in the sacrum -sagittal image 73. Mild to moderate nonspecific presacral stranding. No pelvic free fluid. Negative rectum with retained stool. Uterus surgically absent. Adnexa within normal limits. Numerous pelvic phleboliths. Negative urinary bladder. Redundant sigmoid colon. Proximal sigmoid and left colon diverticulosis with no active inflammation identified. Negative transverse colon. Negative right colon. Ileocecal valve lipoma incidentally noted. Appendix diminutive or absent. Negative terminal ileum. No dilated or abnormal small bowel loops. Occasional surgical clips in the greater omentum. Diminutive stomach. Duodenum within normal limits. Major arterial structures are patent in the abdomen and pelvis with fairly extensive calcified aortic atherosclerosis. Portal venous system appears to be patent. Retroperitoneal lymphadenopathy maximal at the lower lumbar spine level anterior to the IVC measuring up to 22 mm short axis. Numerous increased para renal and other retroperitoneal space nodes are individually up to 14 mm short axis. Mesenteric nodes in the abdomen and pelvis have a more normal appearance. No pelvic sidewall or inguinal lymphadenopathy identified. Solitary small nonspecific low-density area in the right hepatic lobe measuring 10 mm on series 2, image 20, favor benign. Gallbladder not identified and felt to be surgically absent. No splenomegaly or splenic lesion. Negative pancreas and adrenal glands. Bilateral renal enhancement and contrast excretion within normal limits. No abdominal free fluid. IMPRESSION: 1. Retroperitoneal and lower posterior mediastinal / retrocrural soft tissue masses most compatible with lymphadenopathy, and most suggestive of Lymphoma. Some of these might be amenable to CT-guided biopsy, uncertain. 2. Subtle increased sacral epidural soft tissue, but no destructive osseous  lesion identified. Nonspecific presacral stranding. Metastatic disease to the spine not excluded. Electronically Signed   By: Genevie Ann M.D.   On: 08/18/2015 18:52   Nm Pet Image Initial (pi) Skull Base To Thigh  08/27/2015  CLINICAL DATA:  Initial treatment strategy for lymphoma. EXAM: NUCLEAR MEDICINE PET VERTEX TO THIGH TECHNIQUE: 8.75 mCi F-18 FDG was injected intravenously. Full-ring PET imaging was performed from the vertex to thigh after the radiotracer. CT data was obtained and used for attenuation correction and anatomic localization. FASTING BLOOD GLUCOSE:  Value: 126 mg/dl COMPARISON:  08/19/2015 FINDINGS: HEAD AND NECK No abnormal radiotracer accumulation identified within the brain. A  small amount of gas is noted within the anterior horn of the left lateral ventricle. No hypermetabolic lymph nodes in the neck. CHEST Hypermetabolic posterior mediastinal soft tissue at the level of the thigh for ppm attic hiatus is identified. This measures 2 x 2.5 cm and has an SUV max equal to 6.9. No enlarged or hypermetabolic mediastinal or hilar lymph nodes. No hypermetabolic axillary or supraclavicular adenopathy. No suspicious pulmonary nodules identified on the CT images. Calcified granuloma is identified within the posterior left upper lobe. ABDOMEN/PELVIS No abnormal hypermetabolic activity within the liver, pancreas, adrenal glands, or spleen. Prominent retroperitoneal lymph nodes are identified. Index pre caval node just above the aortic bifurcation measures 1.3 cm and has an SUV max equal to 3.50. Increased presacral soft tissue is identified which exhibits mild increased uptake. This measures 1.x 5.1 x 4.0 cm. The SUV max within this area is equal to 4.13. SKELETON Diffuse heterogeneous tracer activity is identified throughout the bone marrow of the axial and proximal appendicular skeleton. More focal areas of intense uptake are noted within the thoracic spine, lumbar spine and pelvis. For example cuff  within the L3 vertebra there is an area of increased uptake which has an SUV max equal to 6.38. IMPRESSION: 1. Abnormal soft tissue within the posterior mediastinum exhibits malignant range FDG uptake and is compatible with clinical history of lymphoma. 2. There are a few enlarged retroperitoneal lymph nodes within the lower abdomen which exhibits nonspecific FDG uptake. Additionally, there is mild increased presacral soft tissue exhibiting low level FDG uptake. 3. Diffuse heterogeneous uptake throughout the bone marrow is noted and is worrisome for lymphomatous involvement. Electronically Signed   By: Kerby Moors M.D.   On: 08/27/2015 11:50   Ir Fluoro Guide Cv Line Right  08/26/2015  CLINICAL DATA:  ACCESS FOR CHEMOTHERAPY, BURKITT'S LYMPHOMA EXAM: RIGHT INTERNAL JUGULAR SINGLE LUMEN POWER PORT CATHETER INSERTION Date:  10/17/201610/17/2016 11:08 am Radiologist:  M. Daryll Brod, MD Guidance:  Ultrasound and fluoroscopic FLUOROSCOPY TIME:  30 seconds, 3 mGy MEDICATIONS AND MEDICAL HISTORY: 2 g Ancefadministered within 1 hour of the procedure.3 mg Versed, 50 mcg fentanyl ANESTHESIA/SEDATION: 30 minutes CONTRAST:  None. COMPLICATIONS: None immediate PROCEDURE: Informed consent was obtained from the patient following explanation of the procedure, risks, benefits and alternatives. The patient understands, agrees and consents for the procedure. All questions were addressed. A time out was performed. Maximal barrier sterile technique utilized including caps, mask, sterile gowns, sterile gloves, large sterile drape, hand hygiene, and 2% chlorhexidine scrub. Under sterile conditions and local anesthesia, right internal jugular micropuncture venous access was performed. Access was performed with ultrasound. Images were obtained for documentation. A guide wire was inserted followed by a transitional dilator. This allowed insertion of a guide wire and catheter into the IVC. Measurements were obtained from the SVC / RA  junction back to the right IJ venotomy site. In the right infraclavicular chest, a subcutaneous pocket was created over the second anterior rib. This was done under sterile conditions and local anesthesia. 1% lidocaine with epinephrine was utilized for this. A 2.5 cm incision was made in the skin. Blunt dissection was performed to create a subcutaneous pocket over the right pectoralis major muscle. The pocket was flushed with saline vigorously. There was adequate hemostasis. The port catheter was assembled and checked for leakage. The port catheter was secured in the pocket with two retention sutures. The tubing was tunneled subcutaneously to the right venotomy site and inserted into the SVC/RA junction through a valved  peel-away sheath. Position was confirmed with fluoroscopy. Images were obtained for documentation. The patient tolerated the procedure well. No immediate complications. Incisions were closed in a two layer fashion with 4 - 0 Vicryl suture. Dermabond was applied to the skin. The port catheter was accessed, blood was aspirated followed by saline and heparin flushes. Needle was removed. A dry sterile dressing was applied. IMPRESSION: Ultrasound and fluoroscopically guided right internal jugular single lumen power port catheter insertion. Tip in the SVC/RA junction. Catheter ready for use. Electronically Signed   By: Jerilynn Mages.  Shick M.D.   On: 08/26/2015 11:44   Ir US Guide Vasc Access Right  08/26/2015  CLINICAL DATA:  ACCESS FOR CHEMOTHERAPY, BURKITT'S LYMPHOMA EXAM: RIGHT INTERNAL JUGULAR SINGLE LUMEN POWER PORT CATHETER INSERTION Date:  10/17/201610/17/2016 11:08 am Radiologist:  M. Daryll Brod, MD Guidance:  Ultrasound and fluoroscopic FLUOROSCOPY TIME:  30 seconds, 3 mGy MEDICATIONS AND MEDICAL HISTORY: 2 g Ancefadministered within 1 hour of the procedure.3 mg Versed, 50 mcg fentanyl ANESTHESIA/SEDATION: 30 minutes CONTRAST:  None. COMPLICATIONS: None immediate PROCEDURE: Informed consent was  obtained from the patient following explanation of the procedure, risks, benefits and alternatives. The patient understands, agrees and consents for the procedure. All questions were addressed. A time out was performed. Maximal barrier sterile technique utilized including caps, mask, sterile gowns, sterile gloves, large sterile drape, hand hygiene, and 2% chlorhexidine scrub. Under sterile conditions and local anesthesia, right internal jugular micropuncture venous access was performed. Access was performed with ultrasound. Images were obtained for documentation. A guide wire was inserted followed by a transitional dilator. This allowed insertion of a guide wire and catheter into the IVC. Measurements were obtained from the SVC / RA junction back to the right IJ venotomy site. In the right infraclavicular chest, a subcutaneous pocket was created over the second anterior rib. This was done under sterile conditions and local anesthesia. 1% lidocaine with epinephrine was utilized for this. A 2.5 cm incision was made in the skin. Blunt dissection was performed to create a subcutaneous pocket over the right pectoralis major muscle. The pocket was flushed with saline vigorously. There was adequate hemostasis. The port catheter was assembled and checked for leakage. The port catheter was secured in the pocket with two retention sutures. The tubing was tunneled subcutaneously to the right venotomy site and inserted into the SVC/RA junction through a valved peel-away sheath. Position was confirmed with fluoroscopy. Images were obtained for documentation. The patient tolerated the procedure well. No immediate complications. Incisions were closed in a two layer fashion with 4 - 0 Vicryl suture. Dermabond was applied to the skin. The port catheter was accessed, blood was aspirated followed by saline and heparin flushes. Needle was removed. A dry sterile dressing was applied. IMPRESSION: Ultrasound and fluoroscopically guided  right internal jugular single lumen power port catheter insertion. Tip in the SVC/RA junction. Catheter ready for use. Electronically Signed   By: Jerilynn Mages.  Shick M.D.   On: 08/26/2015 11:44   Ct Biopsy  08/21/2015  CLINICAL DATA:  Suspected lymphoma EXAM: CT-GUIDED BIOPSY BONE MARROW AND PARA-AORTIC LYMPH NODE. MEDICATIONS AND MEDICAL HISTORY: Versed 2 mg, Fentanyl 100 mcg. Additional Medications: None. ANESTHESIA/SEDATION: Moderate sedation time: Third minutes PROCEDURE: The procedure, risks, benefits, and alternatives were explained to the patient. Questions regarding the procedure were encouraged and answered. The patient understands and consents to the procedure. The back was prepped with Betadine in a sterile fashion, and a sterile drape was applied covering the operative field. A sterile gown and  sterile gloves were used for the procedure. Under CT guidance, an 11 gauge needle was inserted into the right iliac bone via posterior approach. Aspirates and a core were obtained Under CT guidance, an 17 gauge needle was inserted into the para caval lymph node via right posterior lateral approach. Four 18 gauge core biopsies were obtained. The guide needle was removed. Patient tolerated the procedure well without complication. Vital sign monitoring by nursing staff during the procedure will continue as patient is in the special procedures unit for post procedure observation. FINDINGS: The images document guide needle placement within the right iliac bone and pericaval lymph node. Post biopsy images demonstrate no hemorrhage. COMPLICATIONS: None IMPRESSION: Successful CT-guided bone marrow aspirate, bone marrow core, and pericaval lymph node core which was placed in saline for lymphoma flow analysis. Electronically Signed   By: Marybelle Killings M.D.   On: 08/21/2015 14:10   Ct Biopsy  08/21/2015  CLINICAL DATA:  Suspected lymphoma EXAM: CT-GUIDED BIOPSY BONE MARROW AND PARA-AORTIC LYMPH NODE. MEDICATIONS AND MEDICAL  HISTORY: Versed 2 mg, Fentanyl 100 mcg. Additional Medications: None. ANESTHESIA/SEDATION: Moderate sedation time: Third minutes PROCEDURE: The procedure, risks, benefits, and alternatives were explained to the patient. Questions regarding the procedure were encouraged and answered. The patient understands and consents to the procedure. The back was prepped with Betadine in a sterile fashion, and a sterile drape was applied covering the operative field. A sterile gown and sterile gloves were used for the procedure. Under CT guidance, an 11 gauge needle was inserted into the right iliac bone via posterior approach. Aspirates and a core were obtained Under CT guidance, an 17 gauge needle was inserted into the para caval lymph node via right posterior lateral approach. Four 18 gauge core biopsies were obtained. The guide needle was removed. Patient tolerated the procedure well without complication. Vital sign monitoring by nursing staff during the procedure will continue as patient is in the special procedures unit for post procedure observation. FINDINGS: The images document guide needle placement within the right iliac bone and pericaval lymph node. Post biopsy images demonstrate no hemorrhage. COMPLICATIONS: None IMPRESSION: Successful CT-guided bone marrow aspirate, bone marrow core, and pericaval lymph node core which was placed in saline for lymphoma flow analysis. Electronically Signed   By: Marybelle Killings M.D.   On: 08/21/2015 14:10   Dg Abd Acute W/chest  08/18/2015  CLINICAL DATA:  Chest pain and abdominal pain. EXAM: DG ABDOMEN ACUTE W/ 1V CHEST COMPARISON:  Chest x-ray dated 12/12/2012 performed at [place]: Heart size and pulmonary vascularity are normal and the lungs are clear. Surgical clips at the gastroesophageal junction. Slight thoracolumbar scoliosis. No free air or free fluid in the abdomen. Bowel gas pattern is normal. Surgical clips and staples in the abdomen. Multiple  phleboliths in the pelvis. No acute osseous abnormality. IMPRESSION: Negative abdominal radiographs.  No acute cardiopulmonary disease. Electronically Signed   By: Lorriane Shire M.D.   On: 08/18/2015 17:17   Dg Fluoro Guide Lumbar Puncture  08/30/2015  CLINICAL DATA:  History of Burkitt's lymphoma. Diagnostic lumbar puncture with intrathecal chemotherapy injection. Subsequent encounter. EXAM: DIAGNOSTIC LUMBAR PUNCTURE UNDER FLUOROSCOPIC GUIDANCE FLUOROSCOPY TIME:  Radiation Exposure Index (as provided by the fluoroscopic device): If the device does not provide the exposure index: Fluoroscopy Time (in minutes and seconds):  44 seconds Number of Acquired Images:  0. PROCEDURE: Informed consent was obtained from the patient prior to the procedure, including potential complications of headache, allergy, and pain. With the  patient prone, the lower back was prepped with Betadine. 1% Lidocaine was used for local anesthesia. Lumbar puncture was performed at the L5-S1 level using a 20 gauge needle with return of initially blood tinged but clearing. Fourteen ml of CSF were obtained for laboratory studies. Laboratory prepared methotrexate was injected into the subarachnoid space without difficulty. The patient tolerated the procedure well and there were no apparent complications. IMPRESSION: Diagnostic lumbar puncture and intrathecal injection of methotrexate without complication. Electronically Signed   By: Inge Rise M.D.   On: 08/30/2015 15:44   Dg Fluoro Guide Lumbar Puncture  08/26/2015  CLINICAL DATA:  Diagnostic lumbar puncture with intrathecal chemotherapy injection EXAM: FLUOROSCOPICALLY GUIDED LUMBAR PUNCTURE FOR INTRATHECAL CHEMOTHERAPY FLUOROSCOPY TIME:  dictate in minutes and seconds Radiation Exposure Index (as provided by the fluoroscopic device): Not available on this device If the device does not provide the exposure index: Fluoroscopy Time (in minutes and seconds):  1 minutes 32 seconds Number  of Acquired Images:  1 PROCEDURE: Informed consent was obtained from the patient prior to the procedure, including potential complications of headache, allergy, and pain. With the patient prone, the lower back was prepped with Betadine. 1% Lidocaine was used for local anesthesia. Lumbar puncture was performed at the L3-4 level without success, likely due to ligamentous ossification. After numbing, attempt was then made at the L2-3 level, with dry tap despite good needle placement. After numbing, needle placed at the L5-S1 level with easy access to somewhat low pressure CSF (patient NPO for port placement this am). A 20 gauge needle was used. 11 cc of clear CSF were obtained for laboratory studies. The entire syringe of laboratory prepared methotrexate was then injected into the subarachnoid space. The patient tolerated the procedure well without apparent complication. IMPRESSION: Diagnostic lumbar puncture and intrathecal injection of chemotherapy without complication. Electronically Signed   By: Monte Fantasia M.D.   On: 08/26/2015 15:04    Microbiology: Recent Results (from the past 240 hour(s))  Gram stain     Status: None   Collection Time: 08/26/15  2:22 PM  Result Value Ref Range Status   Specimen Description CSF  Final   Special Requests NONE  Final   Gram Stain   Final    NO ORGANISMS SEEN WBC SEEN Gram Stain Report Called to,Read Back By and Verified With: D TORRES AT 6004 ON 10.17.2016 BY NBROOKS    Report Status 08/26/2015 FINAL  Final     Labs: Basic Metabolic Panel:  Recent Labs Lab 08/26/15 0426 08/27/15 0426 08/27/15 1215 08/28/15 0420 08/29/15 0437 08/30/15 0550 08/31/15 0515  NA 135 136 135 135  136 138  137 137 138  K 4.7 4.7 4.7 4.6  4.7 5.3*  5.3* 4.4 4.5  CL 101 103 102 104  105 104  103 102 104  CO2 27 26 27 25  26 28  29 29 30   GLUCOSE 158* 159* 132* 144*  144* 144*  146* 117* 126*  BUN 25* 24* 23* 22*  22* 21*  21* 21* 16  CREATININE 0.74 0.68  0.67 0.63  0.62 0.73  0.74 0.61 0.52  CALCIUM 8.2* 8.0* 8.0* 7.8*  7.8* 8.1*  8.1* 7.9* 8.0*  PHOS 4.3 4.2  --  4.0 3.8 3.3  --    Liver Function Tests:  Recent Labs Lab 08/27/15 1215 08/28/15 0420 08/29/15 0437 08/30/15 0550 08/31/15 0515  AST 11* 11* 13* 15 14*  ALT 20 18 17 20 18   ALKPHOS 70 63 62  58 61  BILITOT 0.5 0.7 0.8 0.8 0.6  PROT 5.5* 4.6* 4.7* 4.5* 4.6*  ALBUMIN 3.2* 2.8*  2.8* 2.8*  2.8* 2.7* 2.8*   No results for input(s): LIPASE, AMYLASE in the last 168 hours. No results for input(s): AMMONIA in the last 168 hours. CBC:  Recent Labs Lab 08/27/15 1215 08/28/15 0420 08/29/15 0437 08/30/15 0550 08/31/15 0515  WBC 2.8* 2.2* 2.0* 2.0* 2.7*  NEUTROABS 1.9 1.5* 1.5* 1.7 2.3  HGB 9.9* 8.4* 8.5* 8.7* 8.8*  HCT 30.1* 25.1* 25.6* 25.9* 26.6*  MCV 87.8 87.8 88.6 86.9 88.7  PLT 135* 123* 129* 117* 121*   Cardiac Enzymes:  Recent Labs Lab 08/28/15 1755 08/29/15 0437  TROPONINI <0.03 <0.03   BNP: BNP (last 3 results) No results for input(s): BNP in the last 8760 hours.  ProBNP (last 3 results) No results for input(s): PROBNP in the last 8760 hours.  CBG:  Recent Labs Lab 08/27/15 0925  GLUCAP 126*       Signed:  Annita Brod  [facility] 08/31/2015, 3:48 PM

## 2015-08-31 NOTE — Care Management Note (Signed)
Case Management Note  Patient Details  Name: Kristen Baker MRN: [id number] Date of Birth: [birth year]  Subjective/Objective:          Burkitt lymphoma with secondary thrombocytopenia          Action/Plan: Chart and notes reviewed. Notified [facility] of dc home today with HH. Pt has 3n1 and RW and family took DME home.   Please see previous NCM notes.   Expected Discharge Date:  08/31/2015               Expected Discharge Plan:  [place]  In-House Referral:     Discharge planning Services  CM Consult  Post Acute Care Choice:  Home Health Choice offered to:  Patient  DME Arranged:  3-N-1, [place] rolling DME Agency:  [place]:  PT, RN, Nurse's Aide [place] Agency:  [place]  Status of Service:  Completed, signed off  Medicare Important Message Given:  Yes-fourth notification given Date Medicare IM Given:    Medicare IM give by:    Date Additional Medicare IM Given:    Additional Medicare Important Message give by:     If discussed at [place] of Stay Meetings, dates discussed:    Additional Comments:  Erenest Rasher, RN 08/31/2015, 1:16 PM

## 2015-08-31 NOTE — Progress Notes (Signed)
Patient discharged to home with family, discharge instructions reviewed with patient and family who verbalized understanding. New RX given to patient.  

## 2015-09-02 ENCOUNTER — Ambulatory Visit (HOSPITAL_BASED_OUTPATIENT_CLINIC_OR_DEPARTMENT_OTHER): Payer: Medicare Other

## 2015-09-02 VITALS — BP 115/52 | HR 68 | Temp 98.0°F

## 2015-09-02 DIAGNOSIS — C8378 Burkitt lymphoma, lymph nodes of multiple sites: Secondary | ICD-10-CM

## 2015-09-02 DIAGNOSIS — Z5189 Encounter for other specified aftercare: Secondary | ICD-10-CM | POA: Diagnosis not present

## 2015-09-02 MED ORDER — PEGFILGRASTIM INJECTION 6 MG/0.6ML ~~LOC~~
6.0000 mg | PREFILLED_SYRINGE | Freq: Once | SUBCUTANEOUS | Status: AC
  Administered 2015-09-02: 6 mg via SUBCUTANEOUS
  Filled 2015-09-02: qty 0.6

## 2015-09-02 NOTE — Patient Instructions (Signed)
Pegfilgrastim injection What is this medicine? PEGFILGRASTIM (PEG fil gra stim) is a long-acting granulocyte colony-stimulating factor that stimulates the growth of neutrophils, a type of white blood cell important in the body's fight against infection. It is used to reduce the incidence of fever and infection in patients with certain types of cancer who are receiving chemotherapy that affects the bone marrow, and to increase survival after being exposed to high doses of radiation. This medicine may be used for other purposes; ask your health care provider or pharmacist if you have questions. What should I tell my health care provider before I take this medicine? They need to know if you have any of these conditions: -kidney disease -latex allergy -ongoing radiation therapy -sickle cell disease -skin reactions to acrylic adhesives (On-Body Injector only) -an unusual or allergic reaction to pegfilgrastim, filgrastim, other medicines, foods, dyes, or preservatives -pregnant or trying to get pregnant -breast-feeding How should I use this medicine? This medicine is for injection under the skin. If you get this medicine at home, you will be taught how to prepare and give the pre-filled syringe or how to use the On-body Injector. Refer to the patient Instructions for Use for detailed instructions. Use exactly as directed. Take your medicine at regular intervals. Do not take your medicine more often than directed. It is important that you put your used needles and syringes in a special sharps container. Do not put them in a trash can. If you do not have a sharps container, call your pharmacist or healthcare provider to get one. Talk to your pediatrician regarding the use of this medicine in children. While this drug may be prescribed for selected conditions, precautions do apply. Overdosage: If you think you have taken too much of this medicine contact a poison control center or emergency room at  once. NOTE: This medicine is only for you. Do not share this medicine with others. What if I miss a dose? It is important not to miss your dose. Call your doctor or health care professional if you miss your dose. If you miss a dose due to an On-body Injector failure or leakage, a new dose should be administered as soon as possible using a single prefilled syringe for manual use. What may interact with this medicine? Interactions have not been studied. Give your health care provider a list of all the medicines, herbs, non-prescription drugs, or dietary supplements you use. Also tell them if you smoke, drink alcohol, or use illegal drugs. Some items may interact with your medicine. This list may not describe all possible interactions. Give your health care provider a list of all the medicines, herbs, non-prescription drugs, or dietary supplements you use. Also tell them if you smoke, drink alcohol, or use illegal drugs. Some items may interact with your medicine. What should I watch for while using this medicine? You may need blood work done while you are taking this medicine. If you are going to need a MRI, CT scan, or other procedure, tell your doctor that you are using this medicine (On-Body Injector only). What side effects may I notice from receiving this medicine? Side effects that you should report to your doctor or health care professional as soon as possible: -allergic reactions like skin rash, itching or hives, swelling of the face, lips, or tongue -dizziness -fever -pain, redness, or irritation at site where injected -pinpoint red spots on the skin -red or dark-brown urine -shortness of breath or breathing problems -stomach or side pain, or pain   at the shoulder -swelling -tiredness -trouble passing urine or change in the amount of urine Side effects that usually do not require medical attention (report to your doctor or health care professional if they continue or are  bothersome): -bone pain -muscle pain This list may not describe all possible side effects. Call your doctor for medical advice about side effects. You may report side effects to FDA at 1-800-FDA-1088. Where should I keep my medicine? Keep out of the reach of children. Store pre-filled syringes in a refrigerator between 2 and 8 degrees C (36 and 46 degrees F). Do not freeze. Keep in carton to protect from light. Throw away this medicine if it is left out of the refrigerator for more than 48 hours. Throw away any unused medicine after the expiration date. NOTE: This sheet is a summary. It may not cover all possible information. If you have questions about this medicine, talk to your doctor, pharmacist, or health care provider.    2016, Elsevier/Gold Standard. (2014-11-15 14:30:14)  

## 2015-09-04 LAB — TISSUE HYBRIDIZATION (BONE MARROW)-NCBH

## 2015-09-04 LAB — CHROMOSOME ANALYSIS, BONE MARROW

## 2015-09-06 ENCOUNTER — Emergency Department (HOSPITAL_COMMUNITY)
Admission: EM | Admit: 2015-09-06 | Discharge: 2015-09-06 | Disposition: A | Discharge status: Home / Self Care | Payer: Medicare Other | Attending: Emergency Medicine | Admitting: Emergency Medicine

## 2015-09-06 ENCOUNTER — Emergency Department (HOSPITAL_COMMUNITY): Payer: Medicare Other

## 2015-09-06 ENCOUNTER — Encounter (HOSPITAL_COMMUNITY): Payer: Self-pay | Admitting: Emergency Medicine

## 2015-09-06 DIAGNOSIS — R3915 Urgency of urination: Secondary | ICD-10-CM | POA: Diagnosis not present

## 2015-09-06 DIAGNOSIS — G8929 Other chronic pain: Secondary | ICD-10-CM | POA: Insufficient documentation

## 2015-09-06 DIAGNOSIS — Z7951 Long term (current) use of inhaled steroids: Secondary | ICD-10-CM | POA: Insufficient documentation

## 2015-09-06 DIAGNOSIS — R531 Weakness: Secondary | ICD-10-CM | POA: Insufficient documentation

## 2015-09-06 DIAGNOSIS — Z9049 Acquired absence of other specified parts of digestive tract: Secondary | ICD-10-CM | POA: Diagnosis not present

## 2015-09-06 DIAGNOSIS — R350 Frequency of micturition: Secondary | ICD-10-CM | POA: Insufficient documentation

## 2015-09-06 DIAGNOSIS — Z79899 Other long term (current) drug therapy: Secondary | ICD-10-CM | POA: Diagnosis not present

## 2015-09-06 DIAGNOSIS — Z90710 Acquired absence of both cervix and uterus: Secondary | ICD-10-CM | POA: Diagnosis not present

## 2015-09-06 DIAGNOSIS — R109 Unspecified abdominal pain: Secondary | ICD-10-CM | POA: Diagnosis not present

## 2015-09-06 DIAGNOSIS — Z8541 Personal history of malignant neoplasm of cervix uteri: Secondary | ICD-10-CM | POA: Insufficient documentation

## 2015-09-06 DIAGNOSIS — C8510 Unspecified B-cell lymphoma, unspecified site: Secondary | ICD-10-CM | POA: Insufficient documentation

## 2015-09-06 DIAGNOSIS — D61818 Other pancytopenia: Secondary | ICD-10-CM | POA: Diagnosis not present

## 2015-09-06 DIAGNOSIS — R5383 Other fatigue: Secondary | ICD-10-CM | POA: Diagnosis present

## 2015-09-06 LAB — COMPREHENSIVE METABOLIC PANEL
ALK PHOS: 63 U/L (ref 38–126)
ALT: 21 U/L (ref 14–54)
AST: 8 U/L — AB (ref 15–41)
Albumin: 2.8 g/dL — ABNORMAL LOW (ref 3.5–5.0)
Anion gap: 7 (ref 5–15)
BUN: 17 mg/dL (ref 6–20)
CALCIUM: 8 mg/dL — AB (ref 8.9–10.3)
CHLORIDE: 98 mmol/L — AB (ref 101–111)
CO2: 27 mmol/L (ref 22–32)
CREATININE: 0.69 mg/dL (ref 0.44–1.00)
GFR calc non Af Amer: >60 mL/min (ref >60)
Glucose, Bld: 162 mg/dL — ABNORMAL HIGH (ref 65–99)
Potassium: 3.5 mmol/L (ref 3.5–5.1)
SODIUM: 132 mmol/L — AB (ref 135–145)
Total Bilirubin: 0.8 mg/dL (ref 0.3–1.2)
Total Protein: 5 g/dL — ABNORMAL LOW (ref 6.5–8.1)

## 2015-09-06 LAB — URINALYSIS, ROUTINE W REFLEX MICROSCOPIC
BILIRUBIN URINE: NEGATIVE
Glucose, UA: NEGATIVE mg/dL
HGB URINE DIPSTICK: NEGATIVE
Ketones, ur: NEGATIVE mg/dL
Leukocytes, UA: NEGATIVE
NITRITE: NEGATIVE
PH: 6 (ref 5.0–8.0)
Protein, ur: NEGATIVE mg/dL
SPECIFIC GRAVITY, URINE: 1.012 (ref 1.005–1.030)
Urobilinogen, UA: 1 mg/dL (ref 0.0–1.0)

## 2015-09-06 LAB — I-STAT TROPONIN, ED: Troponin i, poc: 0 ng/mL (ref 0.00–0.08)

## 2015-09-06 LAB — CBC
HEMATOCRIT: 22.4 % — AB (ref 36.0–46.0)
HEMOGLOBIN: 7.6 g/dL — AB (ref 12.0–15.0)
MCH: 29.7 pg (ref 26.0–34.0)
MCHC: 33.9 g/dL (ref 30.0–36.0)
MCV: 87.5 fL (ref 78.0–100.0)
Platelets: 20 10*3/uL — CL (ref 150–400)
RBC: 2.56 MIL/uL — ABNORMAL LOW (ref 3.87–5.11)
RDW: 15 % (ref 11.5–15.5)
WBC: 0.2 10*3/uL — CL (ref 4.0–10.5)

## 2015-09-06 LAB — PREPARE RBC (CROSSMATCH)

## 2015-09-06 LAB — I-STAT CG4 LACTIC ACID, ED: LACTIC ACID, VENOUS: 0.82 mmol/L (ref 0.5–2.0)

## 2015-09-06 MED ORDER — HEPARIN SOD (PORK) LOCK FLUSH 100 UNIT/ML IV SOLN
500.0000 [IU] | Freq: Once | INTRAVENOUS | Status: AC
  Administered 2015-09-06: 500 [IU]
  Filled 2015-09-06: qty 5

## 2015-09-06 MED ORDER — SODIUM CHLORIDE 0.9 % IV SOLN
10.0000 mL/h | Freq: Once | INTRAVENOUS | Status: AC
  Administered 2015-09-06: 10 mL/h via INTRAVENOUS

## 2015-09-06 MED ORDER — SODIUM CHLORIDE 0.9 % IV BOLUS (SEPSIS)
1000.0000 mL | Freq: Once | INTRAVENOUS | Status: AC
Start: 2015-09-06 — End: 2015-09-06
  Administered 2015-09-06: 1000 mL via INTRAVENOUS

## 2015-09-06 NOTE — ED Provider Notes (Signed)
CSN: [id number]     Arrival date & time 09/06/15  1051 History   First MD Initiated Contact with Patient 09/06/15 1109     Chief Complaint  Patient presents with  . Fatigue     (Consider location/radiation/quality/duration/timing/severity/associated sxs/prior Treatment) HPI Kristen Baker is a 81 y.o. female with just recently (2 wks ago) diagnosed B cell lymphoma, presents to emergency department generalized weakness. Patient was started on chemotherapy which she had 12 days ago. She was just discharged from the hospital a week ago. She has been generally weak but states starting last night she has had increased weakness and generalized malaise. She also reports urinary frequency and a minute episode of incontinence when she couldn't get to the bathroom on time. Patient states she was up all night running to and from the bathroom, and states eventually became so ill that she could not get back to bed from the toilet. Patient's daughter found her in there this morning. Patient reports some abdominal pain, but reports it is chronic. She denies any nausea or vomiting. She denies any fever. No cough or URI symptoms. No back pain. Patient admits to having some nausea straight for which she took and nausea tablet and states she has not had issues with it since then.  Past Medical History  Diagnosis Date  . Cancer [facility])     cervical cancer   Past Surgical History  Procedure Laterality Date  . Cholecystectomy    . Tonsillectomy    . Abdominal hysterectomy    . Abdominal surgery      2-3 rd of colon removed   History reviewed. No pertinent family history. Social History  Substance Use Topics  . Smoking status: Never Smoker   . Smokeless tobacco: None  . Alcohol Use: No   OB History    No data available     Review of Systems  Constitutional: Positive for fatigue. Negative for fever and chills.  Respiratory: Negative for cough, chest tightness and shortness of breath.   Cardiovascular:  Negative for chest pain, palpitations and leg swelling.  Gastrointestinal: Positive for abdominal pain. Negative for nausea, vomiting and diarrhea.  Genitourinary: Positive for urgency and frequency. Negative for dysuria, flank pain and pelvic pain.  Musculoskeletal: Negative for myalgias, arthralgias, neck pain and neck stiffness.  Skin: Negative for rash.  Neurological: Positive for weakness. Negative for dizziness and headaches.  All other systems reviewed and are negative.     Allergies  Cabbage; Onion; Shellfish allergy; Sulfa antibiotics; and Zofran  Home Medications   Prior to Admission medications   Medication Sig Start Date End Date Taking? Authorizing Provider  allopurinol (ZYLOPRIM) 100 MG tablet Take 1 tablet (100 mg total) by mouth 2 (two) times daily. 08/31/15   Annita Brod, MD  dexamethasone (DECADRON) 4 MG tablet Take 1 tablet (4 mg total) by mouth daily. 08/31/15   Annita Brod, MD  DimenhyDRINATE (MOTION SICKNESS PO) Take 1 tablet by mouth daily as needed. For motion sickness per patient    Historical Provider, MD  naproxen sodium (ANAPROX) 220 MG tablet Take 220 mg by mouth daily as needed. For pain    Historical Provider, MD  omeprazole (PRILOSEC) 40 MG capsule Take 40 mg by mouth daily.    Historical Provider, MD  prochlorperazine (COMPAZINE) 10 MG tablet Take 1 tablet (10 mg total) by mouth every 6 (six) hours as needed for nausea or vomiting. 08/31/15   Annita Brod, MD  traMADol (ULTRAM) 50 MG tablet  Take 1 tablet (50 mg total) by mouth every 6 (six) hours as needed for moderate pain. 08/31/15   Annita Brod, MD  Vitamin D, Ergocalciferol, (DRISDOL) 50000 UNITS CAPS capsule Take 50,000 Units by mouth every 7 (seven) days. Take on Tuesdays    Historical Provider, MD   BP 104/47 mmHg  Pulse 86  Temp(Src) 97.6 F (36.4 C) (Oral)  Resp 16  SpO2 95% Physical Exam  Constitutional: She is oriented to person, place, and time. She appears  well-developed and well-nourished. No distress.  HENT:  Head: Normocephalic.  Oral mucosa is dry  Eyes: Conjunctivae are normal.  Neck: Normal range of motion. Neck supple.  Cardiovascular: Normal rate, regular rhythm and normal heart sounds.   Pulmonary/Chest: Effort normal and breath sounds normal. No respiratory distress. She has no wheezes. She has no rales.  Abdominal: Soft. Bowel sounds are normal. She exhibits no distension. There is no tenderness. There is no rebound.  Musculoskeletal: She exhibits no edema.  Neurological: She is alert and oriented to person, place, and time.  Skin: Skin is warm and dry.  Psychiatric: She has a normal mood and affect. Her behavior is normal.  Nursing note and vitals reviewed.   ED Course  Procedures (including critical care time) Labs Review Labs Reviewed  CBC - Abnormal; Notable for the following:    WBC 0.2 (*)    RBC 2.56 (*)    Hemoglobin 7.6 (*)    HCT 22.4 (*)    Platelets 20 (*)    All other components within normal limits  URINALYSIS, ROUTINE W REFLEX MICROSCOPIC (NOT AT [facility]) - Abnormal; Notable for the following:    Color, Urine AMBER (*)    All other components within normal limits  COMPREHENSIVE METABOLIC PANEL - Abnormal; Notable for the following:    Sodium 132 (*)    Chloride 98 (*)    Glucose, Bld 162 (*)    Calcium 8.0 (*)    Total Protein 5.0 (*)    Albumin 2.8 (*)    AST 8 (*)    All other components within normal limits  I-STAT CG4 LACTIC ACID, ED  I-STAT TROPOININ, ED  I-STAT CG4 LACTIC ACID, ED  PREPARE RBC (CROSSMATCH)  TYPE AND SCREEN  PREPARE PLATELET PHERESIS    Imaging Review Dg Chest 2 View  09/06/2015  CLINICAL DATA:  Onset of weakness last night, generalized weakness throughout body, recently diagnosed with cervical cancer with first chemotherapy treatment last week, neutropenia, a intermittent midline lower abdominal pain EXAM: CHEST  2 VIEW COMPARISON:  08/18/2015 FINDINGS: RIGHT jugular  Port-A-Cath with tip projecting over SVC. Normal heart size, mediastinal contours and pulmonary vascularity. Atherosclerotic calcification aorta. Lungs clear. No pleural effusion or pneumothorax. Bones demineralized. IMPRESSION: No acute abnormalities. Electronically Signed   By: Lavonia Dana M.D.   On: 09/06/2015 14:17   I have personally reviewed and evaluated these images and lab results as part of my medical decision-making.   EKG Interpretation   Date/Time:  Friday September 06 2015 11:51:50 EDT Ventricular Rate:  79 PR Interval:  183 QRS Duration: 78 QT Interval:  373 QTC Calculation: 428 R Axis:   -8 Text Interpretation:  Sinus rhythm Low voltage, precordial leads Probable  anteroseptal infarct, old No significant change since last tracing  Confirmed by [facility]  MD, WHITNEY ([id number]) on 09/06/2015 11:56:59 AM      MDM   Final diagnoses:  Pancytopenia (HCC)  Weakness   Pt with generalized weakness. Difficulty ambulating.  Reports urinary frequency. Denies any fever. No other complaints. Vital signs are normal at this time, patient appears to be mildly dehydrated. We will start fluids, get lab work, urinalysis.  2:30 PM Patient's labs are unremarkable other than pancytopenia. Urinalysis is negative. Her pancytopenia is concerning, will call oncology.  2:46 PM Discussed with Dr. Lindi Adie, with oncology, recommended administration of 1 unit of packed blood cells and 1 unit of platelets, then discharge home. Patient has an appointment at 8 in the morning on Monday, she will follow-up then. Patient will need to come back to emergency department if her symptoms are worsening.  Pt signed out to [place], pending transfusion.   Filed Vitals:   09/06/15 1058 09/06/15 1316 09/06/15 1442  BP: 104/47 115/49 117/48  Pulse: 86 81 80  Temp: 97.6 F (36.4 C)    TempSrc: Oral    Resp: 16 19 21   SpO2: 95% 96% 98%      Jeannett Senior, PA-C 09/08/15 [place],  MD 09/11/15 0300

## 2015-09-06 NOTE — ED Notes (Signed)
Patient transported to X-ray 

## 2015-09-06 NOTE — ED Notes (Signed)
Nurse will get Istat lactic acid

## 2015-09-06 NOTE — ED Notes (Signed)
Pt is aware of urine sample. Will info nurse/NT when have to go

## 2015-09-06 NOTE — ED Notes (Signed)
At bedside for first 15 minutes of patient's blood product administration. No SOB noted. No hives. Denies any adverse symptoms. VSS. No change in vital signs signaling a transfusion reaction. Pt A&Ox4.

## 2015-09-06 NOTE — ED Notes (Signed)
Bed: WA19 Expected date:  Expected time:  Means of arrival:  Comments: 

## 2015-09-06 NOTE — ED Provider Notes (Signed)
Patient received in sign out from PA Kirichenko at shift change.  Briefly, [name] here with fatigue and generalized weakness.  Currently undergoing chemo for lymphoma that was diagnosed approx 2 weeks ago.  Patient was found to be neutropenic without fever or obvious source of infection.  hgb low at 7.6, platelets 20.  Oncology was consulted by prior PA-- wants to avoid hospitalization given her neutropenia, patient to be transfused 1 unit platelets, 1 unit PRBCs and has follow-up Monday morning at 8 AM. Patient updated with plan of care. She acknowledged understanding.  Plan:  Blood products ordered.  Will monitor while transfusing for potential reaction/fever.  Results for orders placed or performed during the hospital encounter of 09/06/15  CBC  Result Value Ref Range   WBC 0.2 (LL) 4.0 - 10.5 K/uL   RBC 2.56 (L) 3.87 - 5.11 MIL/uL   Hemoglobin 7.6 (L) 12.0 - 15.0 g/dL   HCT 22.4 (L) 36.0 - 46.0 %   MCV 87.5 78.0 - 100.0 fL   MCH 29.7 26.0 - 34.0 pg   MCHC 33.9 30.0 - 36.0 g/dL   RDW 15.0 11.5 - 15.5 %   Platelets 20 (LL) 150 - 400 K/uL  Urinalysis, Routine w reflex microscopic (not at [facility])  Result Value Ref Range   Color, Urine AMBER (A) YELLOW   APPearance CLEAR CLEAR   Specific Gravity, Urine 1.012 1.005 - 1.030   pH 6.0 5.0 - 8.0   Glucose, UA NEGATIVE NEGATIVE mg/dL   Hgb urine dipstick NEGATIVE NEGATIVE   Bilirubin Urine NEGATIVE NEGATIVE   Ketones, ur NEGATIVE NEGATIVE mg/dL   Protein, ur NEGATIVE NEGATIVE mg/dL   Urobilinogen, UA 1.0 0.0 - 1.0 mg/dL   Nitrite NEGATIVE NEGATIVE   Leukocytes, UA NEGATIVE NEGATIVE  Comprehensive metabolic panel  Result Value Ref Range   Sodium 132 (L) 135 - 145 mmol/L   Potassium 3.5 3.5 - 5.1 mmol/L   Chloride 98 (L) 101 - 111 mmol/L   CO2 27 22 - 32 mmol/L   Glucose, Bld 162 (H) 65 - 99 mg/dL   BUN 17 6 - 20 mg/dL   Creatinine, Ser 0.69 0.44 - 1.00 mg/dL   Calcium 8.0 (L) 8.9 - 10.3 mg/dL   Total Protein 5.0 (L) 6.5 - 8.1 g/dL    Albumin 2.8 (L) 3.5 - 5.0 g/dL   AST 8 (L) 15 - 41 U/L   ALT 21 14 - 54 U/L   Alkaline Phosphatase 63 38 - 126 U/L   Total Bilirubin 0.8 0.3 - 1.2 mg/dL   GFR calc non Af Amer >60 >60 mL/min   GFR calc Af Amer >60 >60 mL/min   Anion gap 7 5 - 15  I-Stat CG4 Lactic Acid, ED  (not at [facility])  Result Value Ref Range   Lactic Acid, Venous 0.82 0.5 - 2.0 mmol/L  I-Stat Troponin, ED (not at [facility])  Result Value Ref Range   Troponin i, poc 0.00 0.00 - 0.08 ng/mL   Comment 3          Prepare RBC  Result Value Ref Range   Order Confirmation ORDER PROCESSED BY BLOOD BANK   Type and screen Niland  Result Value Ref Range   ABO/RH(D) O POS    Antibody Screen NEG    Sample Expiration 09/09/2015    Unit Number [id number]    Blood Component Type RBC, LR IRR    Unit division 00    Status of Unit ISSUED  Transfusion Status OK TO TRANSFUSE    Crossmatch Result Compatible   Prepare Pheresed Platelets  Result Value Ref Range   Unit Number [id number]    Blood Component Type PLTPHER LRI1    Unit division 00    Status of Unit ISSUED    Transfusion Status OK TO TRANSFUSE    Dg Chest 2 View  09/06/2015  CLINICAL DATA:  Onset of weakness last night, generalized weakness throughout body, recently diagnosed with cervical cancer with first chemotherapy treatment last week, neutropenia, a intermittent midline lower abdominal pain EXAM: CHEST  2 VIEW COMPARISON:  08/18/2015 FINDINGS: RIGHT jugular Port-A-Cath with tip projecting over SVC. Normal heart size, mediastinal contours and pulmonary vascularity. Atherosclerotic calcification aorta. Lungs clear. No pleural effusion or pneumothorax. Bones demineralized. IMPRESSION: No acute abnormalities. Electronically Signed   By: Lavonia Dana M.D.   On: 09/06/2015 14:17   Ct Chest Wo Contrast  08/19/2015  CLINICAL DATA:  81 year old female with abnormal CT Abdomen and Pelvis, possible lymphoma. Initial encounter. EXAM: CT CHEST WITHOUT  CONTRAST TECHNIQUE: Multidetector CT imaging of the chest was performed following the standard protocol without IV contrast. COMPARISON:  CT Abdomen and Pelvis 08/18/2015. Chest and abdominal radiographs 08/18/2015. FINDINGS: Noncontrast exam. Stable abnormal inferior posterior mediastinum and retrocrural soft tissue as described recently. No pericardial effusion. No pleural effusion. Mediastinal and axillary lymph nodes are normal. Thoracic inlet lymph nodes are normal. Calcified aortic and coronary artery atherosclerosis. Major airways are patent. There are occasional calcified granulomas. There is right upper lobe bronchiectasis with mild patchy peribronchial opacity along the major fissure (series 305, image 16). Mild tree-in-bud nodular opacity along the inferior lateral right upper lobe near the minor fissure (image 24). Minor dependent atelectasis. Degenerative changes in the thoracic spine. No acute or suspicious osseous lesion in the chest. IMPRESSION: 1. Abnormal posterior mediastinal/retrocrural soft tissue as seen on the recent CT Abdomen and Pelvis. Mediastinal, axillary, and thoracic inlet lymph nodes are normal. 2. Right upper lobe bronchiectasis and peripheral / peribronchial opacity compatible with distal airway infection or less likely scarring. Electronically Signed   By: Genevie Ann M.D.   On: 08/19/2015 23:39   [name] XB Contrast  08/19/2015  CLINICAL DATA:  Headache.  Possible lymphoma. EXAM: MRI HEAD WITHOUT AND WITH CONTRAST TECHNIQUE: Multiplanar, multiecho pulse sequences of the brain and surrounding structures were obtained without and with intravenous contrast. CONTRAST:  44mL MULTIHANCE GADOBENATE DIMEGLUMINE 529 MG/ML IV SOLN COMPARISON:  None. FINDINGS: Ventricle size normal. Cerebral volume normal. Pituitary normal in size. Craniocervical junction normal. Negative for acute infarct. Minimal hyperintensity in the periventricular white matter may represent chronic ischemia. Negative  for hemorrhage or fluid collection. Negative for edema in the brain Postcontrast imaging reveals leptomeningeal enhancement diffusely and bilaterally. There is mild smooth enhancement of the meninges without nodularity. No enhancing mass lesion in the brain. IMPRESSION: Diffuse leptomeningeal enhancement. Given the history, this is concerning for lymphomatous involvement. Lumbar puncture with cytology recommended. Negative for acute infarct or mass lesion. Electronically Signed   By: Franchot Gallo M.D.   On: 08/19/2015 20:47   Ct Abdomen Pelvis W Contrast  08/18/2015  CLINICAL DATA:  81 year old female with lower abdominal pain nausea and vomiting for 1 month. Initial encounter. EXAM: CT ABDOMEN AND PELVIS WITH CONTRAST TECHNIQUE: Multidetector CT imaging of the abdomen and pelvis was performed using the standard protocol following bolus administration of intravenous contrast. CONTRAST:  23mL OMNIPAQUE IOHEXOL 300 MG/ML SOLN, 185mL OMNIPAQUE IOHEXOL 300 MG/ML SOLN COMPARISON:  [facility] Chest CTA 11/20/2012. Acute abdominal series from today FINDINGS: Mild respiratory motion artifact at the lung bases. There are chronic surgical clips about the distal thoracic esophagus and gastroesophageal junction. New since 2014 in the posterior mediastinum and tracking toward the retrocrural space is abnormal confluent soft tissue anterior to the thoracic spine and inseparable from the medial wall of the descending thoracic aorta measuring 3 x 44 x 54 mm (AP by transverse by CC). This seems to be separate from both the aorta and esophagus. There is no anterior thoracic spine erosion. Calcified plaque along this segment of the aorta. No associated pericardial or pleural effusion. Mild bronchiectasis at both lung bases with no confluent pulmonary opacity. Degenerative changes in the spine. Chronic 10 mm lucent area in the left T12 vertebral body is stable and most resembled hemangioma in 2014. No acute or suspicious  osseous lesion is identified however, there is subtle increased ventral epidural soft tissue seen in the sacrum -sagittal image 73. Mild to moderate nonspecific presacral stranding. No pelvic free fluid. Negative rectum with retained stool. Uterus surgically absent. Adnexa within normal limits. Numerous pelvic phleboliths. Negative urinary bladder. Redundant sigmoid colon. Proximal sigmoid and left colon diverticulosis with no active inflammation identified. Negative transverse colon. Negative right colon. Ileocecal valve lipoma incidentally noted. Appendix diminutive or absent. Negative terminal ileum. No dilated or abnormal small bowel loops. Occasional surgical clips in the greater omentum. Diminutive stomach. Duodenum within normal limits. Major arterial structures are patent in the abdomen and pelvis with fairly extensive calcified aortic atherosclerosis. Portal venous system appears to be patent. Retroperitoneal lymphadenopathy maximal at the lower lumbar spine level anterior to the IVC measuring up to 22 mm short axis. Numerous increased para renal and other retroperitoneal space nodes are individually up to 14 mm short axis. Mesenteric nodes in the abdomen and pelvis have a more normal appearance. No pelvic sidewall or inguinal lymphadenopathy identified. Solitary small nonspecific low-density area in the right hepatic lobe measuring 10 mm on series 2, image 20, favor benign. Gallbladder not identified and felt to be surgically absent. No splenomegaly or splenic lesion. Negative pancreas and adrenal glands. Bilateral renal enhancement and contrast excretion within normal limits. No abdominal free fluid. IMPRESSION: 1. Retroperitoneal and lower posterior mediastinal / retrocrural soft tissue masses most compatible with lymphadenopathy, and most suggestive of Lymphoma. Some of these might be amenable to CT-guided biopsy, uncertain. 2. Subtle increased sacral epidural soft tissue, but no destructive osseous  lesion identified. Nonspecific presacral stranding. Metastatic disease to the spine not excluded. Electronically Signed   By: Genevie Ann M.D.   On: 08/18/2015 18:52   Nm Pet Image Initial (pi) Skull Base To Thigh  08/27/2015  CLINICAL DATA:  Initial treatment strategy for lymphoma. EXAM: NUCLEAR MEDICINE PET VERTEX TO THIGH TECHNIQUE: 8.75 mCi F-18 FDG was injected intravenously. Full-ring PET imaging was performed from the vertex to thigh after the radiotracer. CT data was obtained and used for attenuation correction and anatomic localization. FASTING BLOOD GLUCOSE:  Value: 126 mg/dl COMPARISON:  08/19/2015 FINDINGS: HEAD AND NECK No abnormal radiotracer accumulation identified within the brain. A small amount of gas is noted within the anterior horn of the left lateral ventricle. No hypermetabolic lymph nodes in the neck. CHEST Hypermetabolic posterior mediastinal soft tissue at the level of the thigh for ppm attic hiatus is identified. This measures 2 x 2.5 cm and has an SUV max equal to 6.9. No enlarged or hypermetabolic mediastinal or hilar lymph nodes. No hypermetabolic  axillary or supraclavicular adenopathy. No suspicious pulmonary nodules identified on the CT images. Calcified granuloma is identified within the posterior left upper lobe. ABDOMEN/PELVIS No abnormal hypermetabolic activity within the liver, pancreas, adrenal glands, or spleen. Prominent retroperitoneal lymph nodes are identified. Index pre caval node just above the aortic bifurcation measures 1.3 cm and has an SUV max equal to 3.50. Increased presacral soft tissue is identified which exhibits mild increased uptake. This measures 1.x 5.1 x 4.0 cm. The SUV max within this area is equal to 4.13. SKELETON Diffuse heterogeneous tracer activity is identified throughout the bone marrow of the axial and proximal appendicular skeleton. More focal areas of intense uptake are noted within the thoracic spine, lumbar spine and pelvis. For example cuff  within the L3 vertebra there is an area of increased uptake which has an SUV max equal to 6.38. IMPRESSION: 1. Abnormal soft tissue within the posterior mediastinum exhibits malignant range FDG uptake and is compatible with clinical history of lymphoma. 2. There are a few enlarged retroperitoneal lymph nodes within the lower abdomen which exhibits nonspecific FDG uptake. Additionally, there is mild increased presacral soft tissue exhibiting low level FDG uptake. 3. Diffuse heterogeneous uptake throughout the bone marrow is noted and is worrisome for lymphomatous involvement. Electronically Signed   By: Kerby Moors M.D.   On: 08/27/2015 11:50   Ir Fluoro Guide Cv Line Right  08/26/2015  CLINICAL DATA:  ACCESS FOR CHEMOTHERAPY, BURKITT'S LYMPHOMA EXAM: RIGHT INTERNAL JUGULAR SINGLE LUMEN POWER PORT CATHETER INSERTION Date:  10/17/201610/17/2016 11:08 am Radiologist:  M. Daryll Brod, MD Guidance:  Ultrasound and fluoroscopic FLUOROSCOPY TIME:  30 seconds, 3 mGy MEDICATIONS AND MEDICAL HISTORY: 2 g Ancefadministered within 1 hour of the procedure.3 mg Versed, 50 mcg fentanyl ANESTHESIA/SEDATION: 30 minutes CONTRAST:  None. COMPLICATIONS: None immediate PROCEDURE: Informed consent was obtained from the patient following explanation of the procedure, risks, benefits and alternatives. The patient understands, agrees and consents for the procedure. All questions were addressed. A time out was performed. Maximal barrier sterile technique utilized including caps, mask, sterile gowns, sterile gloves, large sterile drape, hand hygiene, and 2% chlorhexidine scrub. Under sterile conditions and local anesthesia, right internal jugular micropuncture venous access was performed. Access was performed with ultrasound. Images were obtained for documentation. A guide wire was inserted followed by a transitional dilator. This allowed insertion of a guide wire and catheter into the IVC. Measurements were obtained from the SVC / RA  junction back to the right IJ venotomy site. In the right infraclavicular chest, a subcutaneous pocket was created over the second anterior rib. This was done under sterile conditions and local anesthesia. 1% lidocaine with epinephrine was utilized for this. A 2.5 cm incision was made in the skin. Blunt dissection was performed to create a subcutaneous pocket over the right pectoralis major muscle. The pocket was flushed with saline vigorously. There was adequate hemostasis. The port catheter was assembled and checked for leakage. The port catheter was secured in the pocket with two retention sutures. The tubing was tunneled subcutaneously to the right venotomy site and inserted into the SVC/RA junction through a valved peel-away sheath. Position was confirmed with fluoroscopy. Images were obtained for documentation. The patient tolerated the procedure well. No immediate complications. Incisions were closed in a two layer fashion with 4 - 0 Vicryl suture. Dermabond was applied to the skin. The port catheter was accessed, blood was aspirated followed by saline and heparin flushes. Needle was removed. A dry sterile dressing was applied. IMPRESSION:  Ultrasound and fluoroscopically guided right internal jugular single lumen power port catheter insertion. Tip in the SVC/RA junction. Catheter ready for use. Electronically Signed   By: Jerilynn Mages.  Shick M.D.   On: 08/26/2015 11:44   Ir US Guide Vasc Access Right  08/26/2015  CLINICAL DATA:  ACCESS FOR CHEMOTHERAPY, BURKITT'S LYMPHOMA EXAM: RIGHT INTERNAL JUGULAR SINGLE LUMEN POWER PORT CATHETER INSERTION Date:  10/17/201610/17/2016 11:08 am Radiologist:  M. Daryll Brod, MD Guidance:  Ultrasound and fluoroscopic FLUOROSCOPY TIME:  30 seconds, 3 mGy MEDICATIONS AND MEDICAL HISTORY: 2 g Ancefadministered within 1 hour of the procedure.3 mg Versed, 50 mcg fentanyl ANESTHESIA/SEDATION: 30 minutes CONTRAST:  None. COMPLICATIONS: None immediate PROCEDURE: Informed consent was  obtained from the patient following explanation of the procedure, risks, benefits and alternatives. The patient understands, agrees and consents for the procedure. All questions were addressed. A time out was performed. Maximal barrier sterile technique utilized including caps, mask, sterile gowns, sterile gloves, large sterile drape, hand hygiene, and 2% chlorhexidine scrub. Under sterile conditions and local anesthesia, right internal jugular micropuncture venous access was performed. Access was performed with ultrasound. Images were obtained for documentation. A guide wire was inserted followed by a transitional dilator. This allowed insertion of a guide wire and catheter into the IVC. Measurements were obtained from the SVC / RA junction back to the right IJ venotomy site. In the right infraclavicular chest, a subcutaneous pocket was created over the second anterior rib. This was done under sterile conditions and local anesthesia. 1% lidocaine with epinephrine was utilized for this. A 2.5 cm incision was made in the skin. Blunt dissection was performed to create a subcutaneous pocket over the right pectoralis major muscle. The pocket was flushed with saline vigorously. There was adequate hemostasis. The port catheter was assembled and checked for leakage. The port catheter was secured in the pocket with two retention sutures. The tubing was tunneled subcutaneously to the right venotomy site and inserted into the SVC/RA junction through a valved peel-away sheath. Position was confirmed with fluoroscopy. Images were obtained for documentation. The patient tolerated the procedure well. No immediate complications. Incisions were closed in a two layer fashion with 4 - 0 Vicryl suture. Dermabond was applied to the skin. The port catheter was accessed, blood was aspirated followed by saline and heparin flushes. Needle was removed. A dry sterile dressing was applied. IMPRESSION: Ultrasound and fluoroscopically guided  right internal jugular single lumen power port catheter insertion. Tip in the SVC/RA junction. Catheter ready for use. Electronically Signed   By: Jerilynn Mages.  Shick M.D.   On: 08/26/2015 11:44   Ct Biopsy  08/21/2015  CLINICAL DATA:  Suspected lymphoma EXAM: CT-GUIDED BIOPSY BONE MARROW AND PARA-AORTIC LYMPH NODE. MEDICATIONS AND MEDICAL HISTORY: Versed 2 mg, Fentanyl 100 mcg. Additional Medications: None. ANESTHESIA/SEDATION: Moderate sedation time: Third minutes PROCEDURE: The procedure, risks, benefits, and alternatives were explained to the patient. Questions regarding the procedure were encouraged and answered. The patient understands and consents to the procedure. The back was prepped with Betadine in a sterile fashion, and a sterile drape was applied covering the operative field. A sterile gown and sterile gloves were used for the procedure. Under CT guidance, an 11 gauge needle was inserted into the right iliac bone via posterior approach. Aspirates and a core were obtained Under CT guidance, an 17 gauge needle was inserted into the para caval lymph node via right posterior lateral approach. Four 18 gauge core biopsies were obtained. The guide needle was removed. Patient tolerated the  procedure well without complication. Vital sign monitoring by nursing staff during the procedure will continue as patient is in the special procedures unit for post procedure observation. FINDINGS: The images document guide needle placement within the right iliac bone and pericaval lymph node. Post biopsy images demonstrate no hemorrhage. COMPLICATIONS: None IMPRESSION: Successful CT-guided bone marrow aspirate, bone marrow core, and pericaval lymph node core which was placed in saline for lymphoma flow analysis. Electronically Signed   By: Marybelle Killings M.D.   On: 08/21/2015 14:10   Ct Biopsy  08/21/2015  CLINICAL DATA:  Suspected lymphoma EXAM: CT-GUIDED BIOPSY BONE MARROW AND PARA-AORTIC LYMPH NODE. MEDICATIONS AND MEDICAL  HISTORY: Versed 2 mg, Fentanyl 100 mcg. Additional Medications: None. ANESTHESIA/SEDATION: Moderate sedation time: Third minutes PROCEDURE: The procedure, risks, benefits, and alternatives were explained to the patient. Questions regarding the procedure were encouraged and answered. The patient understands and consents to the procedure. The back was prepped with Betadine in a sterile fashion, and a sterile drape was applied covering the operative field. A sterile gown and sterile gloves were used for the procedure. Under CT guidance, an 11 gauge needle was inserted into the right iliac bone via posterior approach. Aspirates and a core were obtained Under CT guidance, an 17 gauge needle was inserted into the para caval lymph node via right posterior lateral approach. Four 18 gauge core biopsies were obtained. The guide needle was removed. Patient tolerated the procedure well without complication. Vital sign monitoring by nursing staff during the procedure will continue as patient is in the special procedures unit for post procedure observation. FINDINGS: The images document guide needle placement within the right iliac bone and pericaval lymph node. Post biopsy images demonstrate no hemorrhage. COMPLICATIONS: None IMPRESSION: Successful CT-guided bone marrow aspirate, bone marrow core, and pericaval lymph node core which was placed in saline for lymphoma flow analysis. Electronically Signed   By: Marybelle Killings M.D.   On: 08/21/2015 14:10   Dg Abd Acute W/chest  08/18/2015  CLINICAL DATA:  Chest pain and abdominal pain. EXAM: DG ABDOMEN ACUTE W/ 1V CHEST COMPARISON:  Chest x-ray dated 12/12/2012 performed at [facility]: Heart size and pulmonary vascularity are normal and the lungs are clear. Surgical clips at the gastroesophageal junction. Slight thoracolumbar scoliosis. No free air or free fluid in the abdomen. Bowel gas pattern is normal. Surgical clips and staples in the abdomen. Multiple  phleboliths in the pelvis. No acute osseous abnormality. IMPRESSION: Negative abdominal radiographs.  No acute cardiopulmonary disease. Electronically Signed   By: Lorriane Shire M.D.   On: 08/18/2015 17:17   Dg Fluoro Guide Lumbar Puncture  08/30/2015  CLINICAL DATA:  History of Burkitt's lymphoma. Diagnostic lumbar puncture with intrathecal chemotherapy injection. Subsequent encounter. EXAM: DIAGNOSTIC LUMBAR PUNCTURE UNDER FLUOROSCOPIC GUIDANCE FLUOROSCOPY TIME:  Radiation Exposure Index (as provided by the fluoroscopic device): If the device does not provide the exposure index: Fluoroscopy Time (in minutes and seconds):  44 seconds Number of Acquired Images:  0. PROCEDURE: Informed consent was obtained from the patient prior to the procedure, including potential complications of headache, allergy, and pain. With the patient prone, the lower back was prepped with Betadine. 1% Lidocaine was used for local anesthesia. Lumbar puncture was performed at the L5-S1 level using a 20 gauge needle with return of initially blood tinged but clearing. Fourteen ml of CSF were obtained for laboratory studies. Laboratory prepared methotrexate was injected into the subarachnoid space without difficulty. The patient tolerated the procedure well and there  were no apparent complications. IMPRESSION: Diagnostic lumbar puncture and intrathecal injection of methotrexate without complication. Electronically Signed   By: Inge Rise M.D.   On: 08/30/2015 15:44   Dg Fluoro Guide Lumbar Puncture  08/26/2015  CLINICAL DATA:  Diagnostic lumbar puncture with intrathecal chemotherapy injection EXAM: FLUOROSCOPICALLY GUIDED LUMBAR PUNCTURE FOR INTRATHECAL CHEMOTHERAPY FLUOROSCOPY TIME:  dictate in minutes and seconds Radiation Exposure Index (as provided by the fluoroscopic device): Not available on this device If the device does not provide the exposure index: Fluoroscopy Time (in minutes and seconds):  1 minutes 32 seconds Number  of Acquired Images:  1 PROCEDURE: Informed consent was obtained from the patient prior to the procedure, including potential complications of headache, allergy, and pain. With the patient prone, the lower back was prepped with Betadine. 1% Lidocaine was used for local anesthesia. Lumbar puncture was performed at the L3-4 level without success, likely due to ligamentous ossification. After numbing, attempt was then made at the L2-3 level, with dry tap despite good needle placement. After numbing, needle placed at the L5-S1 level with easy access to somewhat low pressure CSF (patient NPO for port placement this am). A 20 gauge needle was used. 11 cc of clear CSF were obtained for laboratory studies. The entire syringe of laboratory prepared methotrexate was then injected into the subarachnoid space. The patient tolerated the procedure well without apparent complication. IMPRESSION: Diagnostic lumbar puncture and intrathecal injection of chemotherapy without complication. Electronically Signed   By: Monte Fantasia M.D.   On: 08/26/2015 15:04     8:12 PM Blood transfusion finished.  Patient reports she is feeling better.  No complications with transfusion, VSS.  Patient d/c home.  Will follow-up with oncology Monday as arranged by her physician.  Discussed plan with patient, he/she acknowledged understanding and agreed with plan of care.  Return precautions given for new or worsening symptoms.  Larene Pickett, PA-C 09/06/15 [id number]  Leo Grosser, MD 09/07/15 [phone]

## 2015-09-06 NOTE — ED Notes (Signed)
Nurse accessing port and drawing labs 

## 2015-09-06 NOTE — ED Notes (Signed)
PA does not want I stat lactic re-drawn.

## 2015-09-06 NOTE — ED Notes (Signed)
Pt reports she began to feel weak last night. Weakness is generalized over body. Recently diagnosed with cervical cancer, last chemo a week ago (1st treatment). No emesis or sob. Having intermittent midline lower abd pain for a while.

## 2015-09-06 NOTE — Discharge Instructions (Signed)
Please follow-up with your oncologist on Monday. Please return to emergency department if you're getting worse over the weekend.

## 2015-09-06 NOTE — ED Notes (Signed)
AVS explained in detail. Pt has oncology appointment on Monday. VSS. Ambulatory to wheelchair. A&Ox4. Able to tolerate food and drink without N/V. No other c/c.

## 2015-09-06 NOTE — Progress Notes (Signed)
Pt states she previously saw dr in [place] but EPIC information from last discharge indicates pt is scheduled to start seeing Brunetta Genera EPIC updated

## 2015-09-09 ENCOUNTER — Encounter (HOSPITAL_COMMUNITY): Payer: Self-pay

## 2015-09-09 ENCOUNTER — Ambulatory Visit (HOSPITAL_COMMUNITY)
Admit: 2015-09-09 | Discharge: 2015-09-09 | Disposition: A | Discharge status: Home / Self Care | Payer: Medicare Other | Attending: Hematology | Admitting: Hematology

## 2015-09-09 ENCOUNTER — Ambulatory Visit (HOSPITAL_BASED_OUTPATIENT_CLINIC_OR_DEPARTMENT_OTHER): Payer: Medicare Other | Admitting: Hematology

## 2015-09-09 ENCOUNTER — Ambulatory Visit (HOSPITAL_COMMUNITY)
Admit: 2015-09-09 | Discharge: 2015-09-09 | Disposition: A | Discharge status: Home / Self Care | Payer: Medicare Other | Source: Ambulatory Visit | Attending: Hematology | Admitting: Hematology

## 2015-09-09 ENCOUNTER — Ambulatory Visit (HOSPITAL_COMMUNITY): Payer: No Typology Code available for payment source

## 2015-09-09 ENCOUNTER — Other Ambulatory Visit: Payer: Self-pay | Admitting: *Deleted

## 2015-09-09 ENCOUNTER — Other Ambulatory Visit (HOSPITAL_BASED_OUTPATIENT_CLINIC_OR_DEPARTMENT_OTHER): Payer: Medicare Other

## 2015-09-09 VITALS — BP 121/47 | HR 71 | Temp 97.7°F | Resp 18 | Ht 66.0 in | Wt 174.9 lb

## 2015-09-09 DIAGNOSIS — D6959 Other secondary thrombocytopenia: Secondary | ICD-10-CM

## 2015-09-09 DIAGNOSIS — Z5111 Encounter for antineoplastic chemotherapy: Secondary | ICD-10-CM | POA: Insufficient documentation

## 2015-09-09 DIAGNOSIS — C8378 Burkitt lymphoma, lymph nodes of multiple sites: Secondary | ICD-10-CM

## 2015-09-09 DIAGNOSIS — G96198 Other disorders of meninges, not elsewhere classified: Secondary | ICD-10-CM

## 2015-09-09 DIAGNOSIS — R530 Neoplastic (malignant) related fatigue: Secondary | ICD-10-CM

## 2015-09-09 DIAGNOSIS — C8379 Burkitt lymphoma, extranodal and solid organ sites: Secondary | ICD-10-CM | POA: Insufficient documentation

## 2015-09-09 DIAGNOSIS — D6481 Anemia due to antineoplastic chemotherapy: Secondary | ICD-10-CM

## 2015-09-09 DIAGNOSIS — D801 Nonfamilial hypogammaglobulinemia: Secondary | ICD-10-CM | POA: Diagnosis not present

## 2015-09-09 DIAGNOSIS — D6489 Other specified anemias: Secondary | ICD-10-CM

## 2015-09-09 DIAGNOSIS — G039 Meningitis, unspecified: Secondary | ICD-10-CM

## 2015-09-09 DIAGNOSIS — C7949 Secondary malignant neoplasm of other parts of nervous system: Secondary | ICD-10-CM

## 2015-09-09 DIAGNOSIS — D630 Anemia in neoplastic disease: Secondary | ICD-10-CM | POA: Diagnosis not present

## 2015-09-09 LAB — CBC & DIFF AND RETIC
BASO%: 0.1 % (ref 0.0–2.0)
BASOS ABS: 0 10*3/uL (ref 0.0–0.1)
EOS ABS: 0 10*3/uL (ref 0.0–0.5)
EOS%: 0.1 % (ref 0.0–7.0)
HEMATOCRIT: 29.4 % — AB (ref 34.8–46.6)
HEMOGLOBIN: 9.7 g/dL — AB (ref 11.6–15.9)
IMMATURE RETIC FRACT: 20.3 % — AB (ref 1.60–10.00)
LYMPH%: 2.5 % — AB (ref 14.0–49.7)
MCH: 29.8 pg (ref 25.1–34.0)
MCHC: 33.2 g/dL (ref 31.5–36.0)
MCV: 89.6 fL (ref 79.5–101.0)
MONO#: 0.4 10*3/uL (ref 0.1–0.9)
MONO%: 3.6 % (ref 0.0–14.0)
NEUT%: 93.7 % — AB (ref 38.4–76.8)
NEUTROS ABS: 9 10*3/uL — AB (ref 1.5–6.5)
Platelets: 56 10*3/uL — ABNORMAL LOW (ref 145–400)
RBC: 3.28 10*6/uL — ABNORMAL LOW (ref 3.70–5.45)
RDW: 14.8 % — ABNORMAL HIGH (ref 11.2–14.5)
Retic %: 0.82 % (ref 0.70–2.10)
Retic Ct Abs: 26.9 10*3/uL — ABNORMAL LOW (ref 33.70–90.70)
WBC: 9.6 10*3/uL (ref 3.9–10.3)
lymph#: 0.2 10*3/uL — ABNORMAL LOW (ref 0.9–3.3)

## 2015-09-09 LAB — COMPREHENSIVE METABOLIC PANEL (CC13)
ALBUMIN: 2.5 g/dL — AB (ref 3.5–5.0)
ALK PHOS: 79 U/L (ref 40–150)
ALT: 16 U/L (ref 0–55)
AST: <7 U/L (ref 5–34)
Anion Gap: 7 mEq/L (ref 3–11)
BILIRUBIN TOTAL: 0.54 mg/dL (ref 0.20–1.20)
BUN: 18.9 mg/dL (ref 7.0–26.0)
CALCIUM: 8.3 mg/dL — AB (ref 8.4–10.4)
CO2: 27 mEq/L (ref 22–29)
Chloride: 103 mEq/L (ref 98–109)
Creatinine: 0.7 mg/dL (ref 0.6–1.1)
EGFR: 83 mL/min/{1.73_m2} — AB (ref >90)
GLUCOSE: 196 mg/dL — AB (ref 70–140)
POTASSIUM: 4.2 meq/L (ref 3.5–5.1)
SODIUM: 136 meq/L (ref 136–145)
TOTAL PROTEIN: 4.8 g/dL — AB (ref 6.4–8.3)

## 2015-09-09 LAB — TYPE AND SCREEN
ABO/RH(D): O POS
Antibody Screen: NEGATIVE
UNIT DIVISION: 0

## 2015-09-09 LAB — LACTATE DEHYDROGENASE (CC13): LDH: 243 U/L (ref 125–245)

## 2015-09-09 LAB — PREPARE PLATELET PHERESIS: UNIT DIVISION: 0

## 2015-09-09 MED ORDER — DEXAMETHASONE SODIUM PHOSPHATE 4 MG/ML IJ SOLN
8.0000 mg | Freq: Once | INTRAMUSCULAR | Status: AC
  Administered 2015-09-09: 8 mg via INTRAVENOUS
  Filled 2015-09-09: qty 2

## 2015-09-09 MED ORDER — PALONOSETRON HCL INJECTION 0.25 MG/5ML
0.2500 mg | Freq: Once | INTRAVENOUS | Status: AC
  Administered 2015-09-09: 0.25 mg via INTRAVENOUS
  Filled 2015-09-09: qty 5

## 2015-09-09 MED ORDER — LORAZEPAM 0.5 MG PO TABS
0.5000 mg | ORAL_TABLET | Freq: Once | ORAL | Status: AC
  Administered 2015-09-09: 0.5 mg via ORAL
  Filled 2015-09-09 (×2): qty 1

## 2015-09-09 MED ORDER — LORAZEPAM 1 MG PO TABS: 0.5000 mg | ORAL_TABLET | Freq: Once | ORAL | Status: DC

## 2015-09-09 MED ORDER — SODIUM CHLORIDE 0.9 % IJ SOLN
Freq: Once | INTRAMUSCULAR | Status: DC
  Filled 2015-09-09: qty 0.48

## 2015-09-09 NOTE — Discharge Instructions (Signed)
Intrathecal Chemo, Care After Refer to this sheet in the next few weeks. These instructions provide you with information on caring for yourself after your procedure. Your health care provider may also give you more specific instructions. Your treatment has been planned according to current medical practices, but problems sometimes occur. Call your health care provider if you have any problems or questions after your procedure. WHAT TO EXPECT AFTER THE PROCEDURE After your procedure, it is typical to have the following sensations:  Mild discomfort or pain at the insertion site.  Mild headache that is relieved with pain medicines. HOME CARE INSTRUCTIONS  Avoid lifting anything heavier than 10 lb (4.5 kg) for at least 12 hours after the procedure.  Drink enough fluids to keep your urine clear or pale yellow. SEEK MEDICAL CARE IF:  You have fever or chills.  You have nausea or vomiting.  You have a headache that lasts for more than 2 days. SEEK IMMEDIATE MEDICAL CARE IF:  You have any numbness or tingling in your legs.  You are unable to control your bowel or bladder.  You have bleeding or swelling in your back at the insertion site.  You are dizzy or faint.   This information is not intended to replace advice given to you by your health care provider. Make sure you discuss any questions you have with your health care provider.   Document Released: 10/31/2013 Document Reviewed: 10/31/2013 Elsevier Interactive Patient Education 2016 [place] as much as possible at home for the remainder of the day

## 2015-09-09 NOTE — Procedures (Signed)
Informed consent was obtained from the patient prior to the procedure, including potential complications of headache, allergy, and pain. A 'time out' was performed. With the patient prone, the lower back was prepped with Betadine. 1% Lidocaine was used for local anesthesia. Lumbar puncture was performed at the [L5-S1] using a [22] gauge needle with return of clear CSF. [12 mg methotrexate and 50 mg hydrocortisone] pharmacy prepared was injected into the subarachnoid space. The patient tolerated the procedure well without apparent complication.

## 2015-09-13 ENCOUNTER — Other Ambulatory Visit: Payer: Self-pay | Admitting: Hematology

## 2015-09-16 ENCOUNTER — Encounter (HOSPITAL_COMMUNITY): Payer: Self-pay

## 2015-09-16 ENCOUNTER — Encounter (HOSPITAL_COMMUNITY): Payer: Self-pay | Admitting: Hematology

## 2015-09-16 ENCOUNTER — Inpatient Hospital Stay (HOSPITAL_COMMUNITY): Payer: Medicare Other

## 2015-09-16 ENCOUNTER — Inpatient Hospital Stay (HOSPITAL_COMMUNITY)
Admission: AD | Admit: 2015-09-16 | Discharge: 2015-09-20 | DRG: 847 | Disposition: A | Discharge status: Home Health | Payer: Medicare Other | Source: Ambulatory Visit | Attending: Hematology | Admitting: Hematology

## 2015-09-16 DIAGNOSIS — K5909 Other constipation: Secondary | ICD-10-CM

## 2015-09-16 DIAGNOSIS — C8370 Burkitt lymphoma, unspecified site: Secondary | ICD-10-CM

## 2015-09-16 DIAGNOSIS — G96198 Other disorders of meninges, not elsewhere classified: Secondary | ICD-10-CM | POA: Diagnosis present

## 2015-09-16 DIAGNOSIS — Z882 Allergy status to sulfonamides status: Secondary | ICD-10-CM

## 2015-09-16 DIAGNOSIS — C8378 Burkitt lymphoma, lymph nodes of multiple sites: Secondary | ICD-10-CM

## 2015-09-16 DIAGNOSIS — Z91013 Allergy to seafood: Secondary | ICD-10-CM | POA: Diagnosis not present

## 2015-09-16 DIAGNOSIS — D638 Anemia in other chronic diseases classified elsewhere: Secondary | ICD-10-CM | POA: Diagnosis not present

## 2015-09-16 DIAGNOSIS — Z885 Allergy status to narcotic agent status: Secondary | ICD-10-CM

## 2015-09-16 DIAGNOSIS — E46 Unspecified protein-calorie malnutrition: Secondary | ICD-10-CM | POA: Diagnosis present

## 2015-09-16 DIAGNOSIS — Z6826 Body mass index (BMI) 26.0-26.9, adult: Secondary | ICD-10-CM | POA: Diagnosis not present

## 2015-09-16 DIAGNOSIS — Z8541 Personal history of malignant neoplasm of cervix uteri: Secondary | ICD-10-CM

## 2015-09-16 DIAGNOSIS — D630 Anemia in neoplastic disease: Secondary | ICD-10-CM | POA: Diagnosis present

## 2015-09-16 DIAGNOSIS — D801 Nonfamilial hypogammaglobulinemia: Secondary | ICD-10-CM

## 2015-09-16 DIAGNOSIS — R110 Nausea: Secondary | ICD-10-CM

## 2015-09-16 DIAGNOSIS — Z91018 Allergy to other foods: Secondary | ICD-10-CM | POA: Diagnosis not present

## 2015-09-16 DIAGNOSIS — D6489 Other specified anemias: Secondary | ICD-10-CM

## 2015-09-16 DIAGNOSIS — Z888 Allergy status to other drugs, medicaments and biological substances status: Secondary | ICD-10-CM | POA: Diagnosis not present

## 2015-09-16 DIAGNOSIS — G039 Meningitis, unspecified: Secondary | ICD-10-CM

## 2015-09-16 DIAGNOSIS — R634 Abnormal weight loss: Secondary | ICD-10-CM

## 2015-09-16 DIAGNOSIS — K5900 Constipation, unspecified: Secondary | ICD-10-CM | POA: Diagnosis present

## 2015-09-16 DIAGNOSIS — Z5111 Encounter for antineoplastic chemotherapy: Secondary | ICD-10-CM | POA: Diagnosis present

## 2015-09-16 DIAGNOSIS — D6481 Anemia due to antineoplastic chemotherapy: Secondary | ICD-10-CM

## 2015-09-16 HISTORY — DX: Burkitt lymphoma, unspecified site: C83.70

## 2015-09-16 LAB — CSF CELL COUNT WITH DIFFERENTIAL
RBC Count, CSF: 393 /mm3 — ABNORMAL HIGH
Tube #: 1
WBC, CSF: 6 /mm3 — ABNORMAL HIGH (ref 0–5)

## 2015-09-16 LAB — COMPREHENSIVE METABOLIC PANEL
ALK PHOS: 86 U/L (ref 38–126)
ALT: 17 U/L (ref 14–54)
AST: 13 U/L — AB (ref 15–41)
Albumin: 3.1 g/dL — ABNORMAL LOW (ref 3.5–5.0)
Anion gap: 7 (ref 5–15)
BUN: 10 mg/dL (ref 6–20)
CHLORIDE: 99 mmol/L — AB (ref 101–111)
CO2: 28 mmol/L (ref 22–32)
CREATININE: 0.75 mg/dL (ref 0.44–1.00)
Calcium: 8.5 mg/dL — ABNORMAL LOW (ref 8.9–10.3)
GFR calc non Af Amer: >60 mL/min (ref >60)
GLUCOSE: 151 mg/dL — AB (ref 65–99)
Potassium: 4.2 mmol/L (ref 3.5–5.1)
SODIUM: 134 mmol/L — AB (ref 135–145)
Total Bilirubin: 0.5 mg/dL (ref 0.3–1.2)
Total Protein: 5.9 g/dL — ABNORMAL LOW (ref 6.5–8.1)

## 2015-09-16 LAB — CBC WITH DIFFERENTIAL/PLATELET
Basophils Absolute: 0 10*3/uL (ref 0.0–0.1)
Basophils Relative: 1 %
Eosinophils Absolute: 0 10*3/uL (ref 0.0–0.7)
Eosinophils Relative: 0 %
HEMATOCRIT: 28.2 % — AB (ref 36.0–46.0)
HEMOGLOBIN: 9.2 g/dL — AB (ref 12.0–15.0)
LYMPHS ABS: 0.4 10*3/uL — AB (ref 0.7–4.0)
LYMPHS PCT: 10 %
MCH: 30.9 pg (ref 26.0–34.0)
MCHC: 32.6 g/dL (ref 30.0–36.0)
MCV: 94.6 fL (ref 78.0–100.0)
MONOS PCT: 10 %
Monocytes Absolute: 0.4 10*3/uL (ref 0.1–1.0)
NEUTROS ABS: 3.1 10*3/uL (ref 1.7–7.7)
NEUTROS PCT: 79 %
Platelets: 182 10*3/uL (ref 150–400)
RBC: 2.98 MIL/uL — AB (ref 3.87–5.11)
RDW: 14.9 % (ref 11.5–15.5)
WBC: 4 10*3/uL (ref 4.0–10.5)

## 2015-09-16 LAB — URIC ACID: Uric Acid, Serum: 3 mg/dL (ref 2.3–6.6)

## 2015-09-16 LAB — MAGNESIUM: Magnesium: 2 mg/dL (ref 1.7–2.4)

## 2015-09-16 LAB — PROTEIN, CSF: Total  Protein, CSF: 44 mg/dL (ref 15–45)

## 2015-09-16 LAB — LACTATE DEHYDROGENASE: LDH: 159 U/L (ref 98–192)

## 2015-09-16 LAB — PHOSPHORUS: Phosphorus: 3.9 mg/dL (ref 2.5–4.6)

## 2015-09-16 LAB — GLUCOSE, CSF: GLUCOSE CSF: 78 mg/dL — AB (ref 40–70)

## 2015-09-16 MED ORDER — LIDOCAINE-PRILOCAINE 2.5-2.5 % EX CREA: 1.0000 "application " | TOPICAL_CREAM | CUTANEOUS | Status: DC | PRN

## 2015-09-16 MED ORDER — VITAMIN D (ERGOCALCIFEROL) 1.25 MG (50000 UNIT) PO CAPS: 50000.0000 [IU] | ORAL_CAPSULE | ORAL | Status: DC

## 2015-09-16 MED ORDER — PALONOSETRON HCL INJECTION 0.25 MG/5ML
0.2500 mg | Freq: Once | INTRAVENOUS | Status: AC
  Administered 2015-09-16: 0.25 mg via INTRAVENOUS
  Filled 2015-09-16: qty 5

## 2015-09-16 MED ORDER — HOT PACK MISC ONCOLOGY
1.0000 | Freq: Once | Status: DC | PRN
  Filled 2015-09-16: qty 1

## 2015-09-16 MED ORDER — DEXAMETHASONE SODIUM PHOSPHATE 4 MG/ML IJ SOLN
8.0000 mg | Freq: Once | INTRAMUSCULAR | Status: AC
  Administered 2015-09-16: 8 mg via INTRAVENOUS
  Filled 2015-09-16: qty 2

## 2015-09-16 MED ORDER — HEPARIN SOD (PORK) LOCK FLUSH 100 UNIT/ML IV SOLN: 500.0000 [IU] | Freq: Once | INTRAVENOUS | Status: DC | PRN

## 2015-09-16 MED ORDER — VINCRISTINE SULFATE CHEMO INJECTION 1 MG/ML
Freq: Once | INTRAVENOUS | Status: AC
  Administered 2015-09-16: 16:00:00 via INTRAVENOUS
  Filled 2015-09-16: qty 10

## 2015-09-16 MED ORDER — SODIUM CHLORIDE 0.9 % IJ SOLN: 10.0000 mL | INTRAMUSCULAR | Status: DC | PRN

## 2015-09-16 MED ORDER — SENNA 8.6 MG PO TABS
2.0000 | ORAL_TABLET | Freq: Every day | ORAL | Status: DC
  Administered 2015-09-16 – 2015-09-19 (×4): 17.2 mg via ORAL
  Filled 2015-09-16 (×4): qty 2

## 2015-09-16 MED ORDER — PROCHLORPERAZINE EDISYLATE 5 MG/ML IJ SOLN: 5.0000 mg | Freq: Four times a day (QID) | INTRAMUSCULAR | Status: DC | PRN

## 2015-09-16 MED ORDER — SODIUM CHLORIDE 0.9 % IJ SOLN: 3.0000 mL | INTRAMUSCULAR | Status: DC | PRN

## 2015-09-16 MED ORDER — SODIUM CHLORIDE 0.9 % IV SOLN
INTRAVENOUS | Status: DC
  Administered 2015-09-16 – 2015-09-18 (×2): via INTRAVENOUS
  Administered 2015-09-20: 250 mL via INTRAVENOUS

## 2015-09-16 MED ORDER — ACETAMINOPHEN 325 MG PO TABS
650.0000 mg | ORAL_TABLET | ORAL | Status: DC | PRN
Start: 2015-09-16 — End: 2015-09-20
  Administered 2015-09-19: 650 mg via ORAL
  Filled 2015-09-16: qty 2

## 2015-09-16 MED ORDER — COLD PACK MISC ONCOLOGY
1.0000 | Freq: Once | Status: DC | PRN
  Filled 2015-09-16: qty 1

## 2015-09-16 MED ORDER — ALTEPLASE 2 MG IJ SOLR
2.0000 mg | Freq: Once | INTRAMUSCULAR | Status: DC | PRN
  Filled 2015-09-16: qty 2

## 2015-09-16 MED ORDER — ENSURE ENLIVE PO LIQD
237.0000 mL | Freq: Two times a day (BID) | ORAL | Status: DC
  Administered 2015-09-17 – 2015-09-20 (×6): 237 mL via ORAL

## 2015-09-16 MED ORDER — PANTOPRAZOLE SODIUM 40 MG PO TBEC
40.0000 mg | DELAYED_RELEASE_TABLET | Freq: Every day | ORAL | Status: DC
  Administered 2015-09-16 – 2015-09-20 (×5): 40 mg via ORAL
  Filled 2015-09-16 (×5): qty 1

## 2015-09-16 MED ORDER — PREDNISONE 50 MG PO TABS
100.0000 mg | ORAL_TABLET | Freq: Every day | ORAL | Status: DC
  Administered 2015-09-17 – 2015-09-20 (×4): 100 mg via ORAL
  Filled 2015-09-16 (×4): qty 2

## 2015-09-16 MED ORDER — SENNOSIDES-DOCUSATE SODIUM 8.6-50 MG PO TABS: 1.0000 | ORAL_TABLET | Freq: Every evening | ORAL | Status: DC | PRN

## 2015-09-16 MED ORDER — ALUM & MAG HYDROXIDE-SIMETH 200-200-20 MG/5ML PO SUSP: 60.0000 mL | ORAL | Status: DC | PRN

## 2015-09-16 MED ORDER — HEPARIN SOD (PORK) LOCK FLUSH 100 UNIT/ML IV SOLN: 250.0000 [IU] | Freq: Once | INTRAVENOUS | Status: DC | PRN

## 2015-09-16 MED ORDER — METHOTREXATE SODIUM CHEMO INJECTION (PF) 50 MG/2ML
Freq: Once | INTRAMUSCULAR | Status: DC
  Filled 2015-09-16: qty 0.48

## 2015-09-16 NOTE — Procedures (Signed)
CLINICAL DATA: [Lymphoma, methotrexate injection.] EXAM:  FLUOROSCOPICALLY GUIDED LUMBAR PUNCTURE FOR INTRATHECAL CHEMOTHERAPY TECHNIQUE:  Informed consent was obtained from the patient prior to the procedure, including potential complications of headache, allergy, and pain. A 'time out' was performed. With the patient prone, the lower back was prepped with Betadine. 1% Lidocaine was used for local anesthesia. Lumbar puncture was performed at the [L5-S1] using a [20] gauge needle with return of clear CSF. [place] mg] Of methotrexate was injected into the subarachnoid space. The patient tolerated the procedure well without apparent complication. FLUOROSCOPY TIME:  Fluoroscopy Time (in minutes and seconds): [0 minutes 40 seconds.] Number of Acquired Images: [None.] IMPRESSION:  [  1. Intrathecal injection of chemotherapy without complication.] 2. Due to extremely slow flow, less than 1 cc CSF was collected for laboratory evaluation.

## 2015-09-16 NOTE — H&P (Signed)
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 female who is here for her second cycle of second cycle of da EPOCH-R chemotherapy for Burkitt's lymphoma. She is also planned to get day 1 and day 5 intrathecal methotrexate with hydrocortisone for leptomeningeal involvement with her Burkitt's lymphoma.  Patient tolerated first cycle da EPOCH-R well with no overt prohibitive toxicities. Had some nausea with Zofran and therefore her premeds were changed to Aloxi which she tolerated well. Has been tapered down from her dexamethasone. He developed some symptomatic anemia requiring 2 units of blood transfusion. Today her CBC appears good with complete resolution of her thrombocytopenia. WBC count is good. Still with some anemia with hemoglobin in the low 9 range.  No headaches. Resolving chin numbness. No other acute new focal neurological symptoms. Patient notes that she is eating better with improvement in her albumin levels. Notes that she has been independently ambulatory at home in the last week or two. No other acute concerns at this time. Feels ready to proceed with her second cycle of treatment.   MEDICAL HISTORY:  Past Medical History  Diagnosis Date  . Cancer (HCC)     cervical cancer  . Burkitt's lymphoma ([place]) 08/29/2015    SURGICAL HISTORY: Past Surgical History  Procedure Laterality Date  . Cholecystectomy    . Tonsillectomy    . Abdominal hysterectomy    . Abdominal surgery      2-3 rd of colon removed    SOCIAL HISTORY: Social History   Social History  . Marital Status:  Widowed    Spouse Name: N/A  . Number of Children: N/A  . Years of Education: N/A   Occupational History  . Not on file.   Social History Main Topics  . Smoking status: Never Smoker   . Smokeless tobacco: Not on file  . Alcohol Use: No  . Drug Use: No  . Sexual Activity: Not on file   Other Topics Concern  . Not on file   Social History Narrative    FAMILY HISTORY: No family history on file.  ALLERGIES:  is allergic to cabbage; codeine; onion; shellfish allergy; sulfa antibiotics; and zofran.  MEDICATIONS:  No current facility-administered medications for this encounter.   Facility-Administered Medications Ordered in Other Encounters  Medication Dose Route Frequency Provider Last Rate Last Dose  . LORazepam (ATIVAN) tablet 0.5 mg  0.5 mg Oral Once Brunetta Genera, MD        REVIEW OF SYSTEMS:    10 Point review of Systems was done is negative except as noted above.  PHYSICAL EXAMINATION: ECOG PERFORMANCE STATUS: 1 - Symptomatic but completely ambulatory  . Filed Vitals:   09/16/15 1059  BP: 139/52  Pulse: 78  Temp: 98.3 F (36.8 C)  Resp: 18   Filed Weights   09/16/15 1059  Weight: 166 lb 4.8 oz (75.433 kg)   .Body mass index is 26.85 kg/(m^2).    GENERAL: Elderly lady in no acute distress SKIN: skin color, texture, turgor are normal, no rashes or significant lesions EYES: normal, conjunctiva are pink and non-injected, sclera clear, baseline strabismus. OROPHARYNX:no exudate, no  erythema and lips, buccal mucosa, and tongue normal  NECK: supple, no JVD, thyroid normal size, non-tender, without nodularity LYMPH:  no palpable lymphadenopathy in the cervical, axillary or inguinal LUNGS: clear to auscultation with normal respiratory effort HEART: regular rate & rhythm,  no murmurs and no lower extremity edema ABDOMEN: abdomen soft, non-tender, normoactive bowel sounds  Musculoskeletal: no cyanosis of digits and no clubbing  PSYCH: alert & oriented x 3  with fluent speech NEURO: no focal motor/sensory deficits  LABORATORY DATA:  I have reviewed the data as listed  . CBC Latest Ref Rng 09/16/2015 09/09/2015 09/06/2015  WBC 4.0 - 10.5 K/uL 4.0 9.6 0.2(LL)  Hemoglobin 12.0 - 15.0 g/dL 9.2(L) 9.7(L) 7.6(L)  Hematocrit 36.0 - 46.0 % 28.2(L) 29.4(L) 22.4(L)  Platelets 150 - 400 K/uL 182 56(L) 20(LL)    . CMP Latest Ref Rng 09/09/2015 09/06/2015 08/31/2015  Glucose 70 - 140 mg/dl 196(H) 162(H) 126(H)  BUN 7.0 - 26.0 mg/dL 18.9 17 16   Creatinine 0.6 - 1.1 mg/dL 0.7 0.69 0.52  Sodium 136 - 145 mEq/L 136 132(L) 138  Potassium 3.5 - 5.1 mEq/L 4.2 3.5 4.5  Chloride 101 - 111 mmol/L - 98(L) 104  CO2 22 - 29 mEq/L 27 27 30   Calcium 8.4 - 10.4 mg/dL 8.3(L) 8.0(L) 8.0(L)  Total Protein 6.4 - 8.3 g/dL 4.8(L) 5.0(L) 4.6(L)  Total Bilirubin 0.20 - 1.20 mg/dL 0.54 0.8 0.6  Alkaline Phos 40 - 150 U/L 79 63 61  AST 5 - 34 U/L <7 8(L) 14(L)  ALT 0 - 55 U/L 16 21 18    . Lab Results  Component Value Date   LDH 243 09/09/2015    RADIOGRAPHIC STUDIES: I have personally reviewed the radiological images as listed and agreed with the findings in the report. Dg Chest 2 View  09/06/2015  CLINICAL DATA:  Onset of weakness last night, generalized weakness throughout body, recently diagnosed with cervical cancer with first chemotherapy treatment last week, neutropenia, a intermittent midline lower abdominal pain EXAM: CHEST  2 VIEW COMPARISON:  08/18/2015 FINDINGS: RIGHT jugular Port-A-Cath with tip projecting over SVC. Normal heart size, mediastinal contours and pulmonary vascularity. Atherosclerotic calcification aorta. Lungs clear. No pleural effusion or pneumothorax. Bones demineralized. IMPRESSION: No acute abnormalities. Electronically Signed   By: Lavonia Dana M.D.   On: 09/06/2015 14:17   Ct Chest Wo Contrast  08/19/2015  CLINICAL DATA:  81 year old female with abnormal CT Abdomen and Pelvis, possible lymphoma. Initial encounter. EXAM: CT CHEST  WITHOUT CONTRAST TECHNIQUE: Multidetector CT imaging of the chest was performed following the standard protocol without IV contrast. COMPARISON:  CT Abdomen and Pelvis 08/18/2015. Chest and abdominal radiographs 08/18/2015. FINDINGS: Noncontrast exam. Stable abnormal inferior posterior mediastinum and retrocrural soft tissue as described recently. No pericardial effusion. No pleural effusion. Mediastinal and axillary lymph nodes are normal. Thoracic inlet lymph nodes are normal. Calcified aortic and coronary artery atherosclerosis. Major airways are patent. There are occasional calcified granulomas. There is right upper lobe bronchiectasis with mild patchy peribronchial opacity along the major fissure (series 305, image 16). Mild tree-in-bud nodular opacity along the inferior lateral right upper lobe near the minor fissure (image 24). Minor dependent atelectasis. Degenerative changes in the thoracic spine. No acute or suspicious osseous lesion in the chest. IMPRESSION: 1. Abnormal posterior mediastinal/retrocrural soft tissue as seen on the recent CT Abdomen and Pelvis. Mediastinal, axillary, and thoracic inlet lymph nodes are normal. 2. Right upper lobe bronchiectasis and peripheral / peribronchial opacity compatible with distal airway infection or  less likely scarring. Electronically Signed   By: Genevie Ann M.D.   On: 08/19/2015 23:39   [name] IO Contrast  08/19/2015  CLINICAL DATA:  Headache.  Possible lymphoma. EXAM: MRI HEAD WITHOUT AND WITH CONTRAST TECHNIQUE: Multiplanar, multiecho pulse sequences of the brain and surrounding structures were obtained without and with intravenous contrast. CONTRAST:  53mL MULTIHANCE GADOBENATE DIMEGLUMINE 529 MG/ML IV SOLN COMPARISON:  None. FINDINGS: Ventricle size normal. Cerebral volume normal. Pituitary normal in size. Craniocervical junction normal. Negative for acute infarct. Minimal hyperintensity in the periventricular white matter may represent chronic ischemia.  Negative for hemorrhage or fluid collection. Negative for edema in the brain Postcontrast imaging reveals leptomeningeal enhancement diffusely and bilaterally. There is mild smooth enhancement of the meninges without nodularity. No enhancing mass lesion in the brain. IMPRESSION: Diffuse leptomeningeal enhancement. Given the history, this is concerning for lymphomatous involvement. Lumbar puncture with cytology recommended. Negative for acute infarct or mass lesion. Electronically Signed   By: Franchot Gallo M.D.   On: 08/19/2015 20:47   Ct Abdomen Pelvis W Contrast  08/18/2015  CLINICAL DATA:  81 year old female with lower abdominal pain nausea and vomiting for 1 month. Initial encounter. EXAM: CT ABDOMEN AND PELVIS WITH CONTRAST TECHNIQUE: Multidetector CT imaging of the abdomen and pelvis was performed using the standard protocol following bolus administration of intravenous contrast. CONTRAST:  52mL OMNIPAQUE IOHEXOL 300 MG/ML SOLN, 13mL OMNIPAQUE IOHEXOL 300 MG/ML SOLN COMPARISON:  [facility] Chest CTA 11/20/2012. Acute abdominal series from today FINDINGS: Mild respiratory motion artifact at the lung bases. There are chronic surgical clips about the distal thoracic esophagus and gastroesophageal junction. New since 2014 in the posterior mediastinum and tracking toward the retrocrural space is abnormal confluent soft tissue anterior to the thoracic spine and inseparable from the medial wall of the descending thoracic aorta measuring 3 x 44 x 54 mm (AP by transverse by CC). This seems to be separate from both the aorta and esophagus. There is no anterior thoracic spine erosion. Calcified plaque along this segment of the aorta. No associated pericardial or pleural effusion. Mild bronchiectasis at both lung bases with no confluent pulmonary opacity. Degenerative changes in the spine. Chronic 10 mm lucent area in the left T12 vertebral body is stable and most resembled hemangioma in 2014. No acute or  suspicious osseous lesion is identified however, there is subtle increased ventral epidural soft tissue seen in the sacrum -sagittal image 73. Mild to moderate nonspecific presacral stranding. No pelvic free fluid. Negative rectum with retained stool. Uterus surgically absent. Adnexa within normal limits. Numerous pelvic phleboliths. Negative urinary bladder. Redundant sigmoid colon. Proximal sigmoid and left colon diverticulosis with no active inflammation identified. Negative transverse colon. Negative right colon. Ileocecal valve lipoma incidentally noted. Appendix diminutive or absent. Negative terminal ileum. No dilated or abnormal small bowel loops. Occasional surgical clips in the greater omentum. Diminutive stomach. Duodenum within normal limits. Major arterial structures are patent in the abdomen and pelvis with fairly extensive calcified aortic atherosclerosis. Portal venous system appears to be patent. Retroperitoneal lymphadenopathy maximal at the lower lumbar spine level anterior to the IVC measuring up to 22 mm short axis. Numerous increased para renal and other retroperitoneal space nodes are individually up to 14 mm short axis. Mesenteric nodes in the abdomen and pelvis have a more normal appearance. No pelvic sidewall or inguinal lymphadenopathy identified. Solitary small nonspecific low-density area in the right hepatic lobe measuring 10 mm on series 2, image 20, favor benign. Gallbladder not  identified and felt to be surgically absent. No splenomegaly or splenic lesion. Negative pancreas and adrenal glands. Bilateral renal enhancement and contrast excretion within normal limits. No abdominal free fluid. IMPRESSION: 1. Retroperitoneal and lower posterior mediastinal / retrocrural soft tissue masses most compatible with lymphadenopathy, and most suggestive of Lymphoma. Some of these might be amenable to CT-guided biopsy, uncertain. 2. Subtle increased sacral epidural soft tissue, but no destructive  osseous lesion identified. Nonspecific presacral stranding. Metastatic disease to the spine not excluded. Electronically Signed   By: Genevie Ann M.D.   On: 08/18/2015 18:52   Nm Pet Image Initial (pi) Skull Base To Thigh  08/27/2015  CLINICAL DATA:  Initial treatment strategy for lymphoma. EXAM: NUCLEAR MEDICINE PET VERTEX TO THIGH TECHNIQUE: 8.75 mCi F-18 FDG was injected intravenously. Full-ring PET imaging was performed from the vertex to thigh after the radiotracer. CT data was obtained and used for attenuation correction and anatomic localization. FASTING BLOOD GLUCOSE:  Value: 126 mg/dl COMPARISON:  08/19/2015 FINDINGS: HEAD AND NECK No abnormal radiotracer accumulation identified within the brain. A small amount of gas is noted within the anterior horn of the left lateral ventricle. No hypermetabolic lymph nodes in the neck. CHEST Hypermetabolic posterior mediastinal soft tissue at the level of the thigh for ppm attic hiatus is identified. This measures 2 x 2.5 cm and has an SUV max equal to 6.9. No enlarged or hypermetabolic mediastinal or hilar lymph nodes. No hypermetabolic axillary or supraclavicular adenopathy. No suspicious pulmonary nodules identified on the CT images. Calcified granuloma is identified within the posterior left upper lobe. ABDOMEN/PELVIS No abnormal hypermetabolic activity within the liver, pancreas, adrenal glands, or spleen. Prominent retroperitoneal lymph nodes are identified. Index pre caval node just above the aortic bifurcation measures 1.3 cm and has an SUV max equal to 3.50. Increased presacral soft tissue is identified which exhibits mild increased uptake. This measures 1.x 5.1 x 4.0 cm. The SUV max within this area is equal to 4.13. SKELETON Diffuse heterogeneous tracer activity is identified throughout the bone marrow of the axial and proximal appendicular skeleton. More focal areas of intense uptake are noted within the thoracic spine, lumbar spine and pelvis. For example  cuff within the L3 vertebra there is an area of increased uptake which has an SUV max equal to 6.38. IMPRESSION: 1. Abnormal soft tissue within the posterior mediastinum exhibits malignant range FDG uptake and is compatible with clinical history of lymphoma. 2. There are a few enlarged retroperitoneal lymph nodes within the lower abdomen which exhibits nonspecific FDG uptake. Additionally, there is mild increased presacral soft tissue exhibiting low level FDG uptake. 3. Diffuse heterogeneous uptake throughout the bone marrow is noted and is worrisome for lymphomatous involvement. Electronically Signed   By: Kerby Moors M.D.   On: 08/27/2015 11:50   Ir Fluoro Guide Cv Line Right  08/26/2015  CLINICAL DATA:  ACCESS FOR CHEMOTHERAPY, BURKITT'S LYMPHOMA EXAM: RIGHT INTERNAL JUGULAR SINGLE LUMEN POWER PORT CATHETER INSERTION Date:  10/17/201610/17/2016 11:08 am Radiologist:  M. Daryll Brod, MD Guidance:  Ultrasound and fluoroscopic FLUOROSCOPY TIME:  30 seconds, 3 mGy MEDICATIONS AND MEDICAL HISTORY: 2 g Ancefadministered within 1 hour of the procedure.3 mg Versed, 50 mcg fentanyl ANESTHESIA/SEDATION: 30 minutes CONTRAST:  None. COMPLICATIONS: None immediate PROCEDURE: Informed consent was obtained from the patient following explanation of the procedure, risks, benefits and alternatives. The patient understands, agrees and consents for the procedure. All questions were addressed. A time out was performed. Maximal barrier sterile technique utilized including caps,  mask, sterile gowns, sterile gloves, large sterile drape, hand hygiene, and 2% chlorhexidine scrub. Under sterile conditions and local anesthesia, right internal jugular micropuncture venous access was performed. Access was performed with ultrasound. Images were obtained for documentation. A guide wire was inserted followed by a transitional dilator. This allowed insertion of a guide wire and catheter into the IVC. Measurements were obtained from the SVC  / RA junction back to the right IJ venotomy site. In the right infraclavicular chest, a subcutaneous pocket was created over the second anterior rib. This was done under sterile conditions and local anesthesia. 1% lidocaine with epinephrine was utilized for this. A 2.5 cm incision was made in the skin. Blunt dissection was performed to create a subcutaneous pocket over the right pectoralis major muscle. The pocket was flushed with saline vigorously. There was adequate hemostasis. The port catheter was assembled and checked for leakage. The port catheter was secured in the pocket with two retention sutures. The tubing was tunneled subcutaneously to the right venotomy site and inserted into the SVC/RA junction through a valved peel-away sheath. Position was confirmed with fluoroscopy. Images were obtained for documentation. The patient tolerated the procedure well. No immediate complications. Incisions were closed in a two layer fashion with 4 - 0 Vicryl suture. Dermabond was applied to the skin. The port catheter was accessed, blood was aspirated followed by saline and heparin flushes. Needle was removed. A dry sterile dressing was applied. IMPRESSION: Ultrasound and fluoroscopically guided right internal jugular single lumen power port catheter insertion. Tip in the SVC/RA junction. Catheter ready for use. Electronically Signed   By: Jerilynn Mages.  Shick M.D.   On: 08/26/2015 11:44   Ir US Guide Vasc Access Right  08/26/2015  CLINICAL DATA:  ACCESS FOR CHEMOTHERAPY, BURKITT'S LYMPHOMA EXAM: RIGHT INTERNAL JUGULAR SINGLE LUMEN POWER PORT CATHETER INSERTION Date:  10/17/201610/17/2016 11:08 am Radiologist:  M. Daryll Brod, MD Guidance:  Ultrasound and fluoroscopic FLUOROSCOPY TIME:  30 seconds, 3 mGy MEDICATIONS AND MEDICAL HISTORY: 2 g Ancefadministered within 1 hour of the procedure.3 mg Versed, 50 mcg fentanyl ANESTHESIA/SEDATION: 30 minutes CONTRAST:  None. COMPLICATIONS: None immediate PROCEDURE: Informed consent was  obtained from the patient following explanation of the procedure, risks, benefits and alternatives. The patient understands, agrees and consents for the procedure. All questions were addressed. A time out was performed. Maximal barrier sterile technique utilized including caps, mask, sterile gowns, sterile gloves, large sterile drape, hand hygiene, and 2% chlorhexidine scrub. Under sterile conditions and local anesthesia, right internal jugular micropuncture venous access was performed. Access was performed with ultrasound. Images were obtained for documentation. A guide wire was inserted followed by a transitional dilator. This allowed insertion of a guide wire and catheter into the IVC. Measurements were obtained from the SVC / RA junction back to the right IJ venotomy site. In the right infraclavicular chest, a subcutaneous pocket was created over the second anterior rib. This was done under sterile conditions and local anesthesia. 1% lidocaine with epinephrine was utilized for this. A 2.5 cm incision was made in the skin. Blunt dissection was performed to create a subcutaneous pocket over the right pectoralis major muscle. The pocket was flushed with saline vigorously. There was adequate hemostasis. The port catheter was assembled and checked for leakage. The port catheter was secured in the pocket with two retention sutures. The tubing was tunneled subcutaneously to the right venotomy site and inserted into the SVC/RA junction through a valved peel-away sheath. Position was confirmed with fluoroscopy. Images were obtained  for documentation. The patient tolerated the procedure well. No immediate complications. Incisions were closed in a two layer fashion with 4 - 0 Vicryl suture. Dermabond was applied to the skin. The port catheter was accessed, blood was aspirated followed by saline and heparin flushes. Needle was removed. A dry sterile dressing was applied. IMPRESSION: Ultrasound and fluoroscopically guided  right internal jugular single lumen power port catheter insertion. Tip in the SVC/RA junction. Catheter ready for use. Electronically Signed   By: Jerilynn Mages.  Shick M.D.   On: 08/26/2015 11:44   Ct Biopsy  08/21/2015  CLINICAL DATA:  Suspected lymphoma EXAM: CT-GUIDED BIOPSY BONE MARROW AND PARA-AORTIC LYMPH NODE. MEDICATIONS AND MEDICAL HISTORY: Versed 2 mg, Fentanyl 100 mcg. Additional Medications: None. ANESTHESIA/SEDATION: Moderate sedation time: Third minutes PROCEDURE: The procedure, risks, benefits, and alternatives were explained to the patient. Questions regarding the procedure were encouraged and answered. The patient understands and consents to the procedure. The back was prepped with Betadine in a sterile fashion, and a sterile drape was applied covering the operative field. A sterile gown and sterile gloves were used for the procedure. Under CT guidance, an 11 gauge needle was inserted into the right iliac bone via posterior approach. Aspirates and a core were obtained Under CT guidance, an 17 gauge needle was inserted into the para caval lymph node via right posterior lateral approach. Four 18 gauge core biopsies were obtained. The guide needle was removed. Patient tolerated the procedure well without complication. Vital sign monitoring by nursing staff during the procedure will continue as patient is in the special procedures unit for post procedure observation. FINDINGS: The images document guide needle placement within the right iliac bone and pericaval lymph node. Post biopsy images demonstrate no hemorrhage. COMPLICATIONS: None IMPRESSION: Successful CT-guided bone marrow aspirate, bone marrow core, and pericaval lymph node core which was placed in saline for lymphoma flow analysis. Electronically Signed   By: Marybelle Killings M.D.   On: 08/21/2015 14:10   Ct Biopsy  08/21/2015  CLINICAL DATA:  Suspected lymphoma EXAM: CT-GUIDED BIOPSY BONE MARROW AND PARA-AORTIC LYMPH NODE. MEDICATIONS AND MEDICAL  HISTORY: Versed 2 mg, Fentanyl 100 mcg. Additional Medications: None. ANESTHESIA/SEDATION: Moderate sedation time: Third minutes PROCEDURE: The procedure, risks, benefits, and alternatives were explained to the patient. Questions regarding the procedure were encouraged and answered. The patient understands and consents to the procedure. The back was prepped with Betadine in a sterile fashion, and a sterile drape was applied covering the operative field. A sterile gown and sterile gloves were used for the procedure. Under CT guidance, an 11 gauge needle was inserted into the right iliac bone via posterior approach. Aspirates and a core were obtained Under CT guidance, an 17 gauge needle was inserted into the para caval lymph node via right posterior lateral approach. Four 18 gauge core biopsies were obtained. The guide needle was removed. Patient tolerated the procedure well without complication. Vital sign monitoring by nursing staff during the procedure will continue as patient is in the special procedures unit for post procedure observation. FINDINGS: The images document guide needle placement within the right iliac bone and pericaval lymph node. Post biopsy images demonstrate no hemorrhage. COMPLICATIONS: None IMPRESSION: Successful CT-guided bone marrow aspirate, bone marrow core, and pericaval lymph node core which was placed in saline for lymphoma flow analysis. Electronically Signed   By: Marybelle Killings M.D.   On: 08/21/2015 14:10   Dg Abd Acute W/chest  08/18/2015  CLINICAL DATA:  Chest pain and abdominal pain.  EXAM: DG ABDOMEN ACUTE W/ 1V CHEST COMPARISON:  Chest x-ray dated 12/12/2012 performed at [place]: Heart size and pulmonary vascularity are normal and the lungs are clear. Surgical clips at the gastroesophageal junction. Slight thoracolumbar scoliosis. No free air or free fluid in the abdomen. Bowel gas pattern is normal. Surgical clips and staples in the abdomen. Multiple  phleboliths in the pelvis. No acute osseous abnormality. IMPRESSION: Negative abdominal radiographs.  No acute cardiopulmonary disease. Electronically Signed   By: Lorriane Shire M.D.   On: 08/18/2015 17:17   Dg Fluoro Guide Lumbar Puncture  09/09/2015  CLINICAL DATA:  Burkitt's lymphoma with leptomeningeal involvement. Third dose intrathecal chemotherapy EXAM: FLUOROSCOPICALLY GUIDED LUMBAR PUNCTURE FOR INTRATHECAL CHEMOTHERAPY TECHNIQUE: Informed consent was obtained from the patient prior to the procedure, including potential complications of headache, allergy, and pain. A 'time out' was performed. With the patient prone, the lower back was prepped with Betadine. 1% Lidocaine was used for local anesthesia. Lumbar puncture was performed at the L5-S1 using a 22 gauge needle with return of clear CSF. 12 mg methotrexate and 50 mg hydrocortisone pharmacy prepared was injected into the subarachnoid space. The patient tolerated the procedure well without apparent complication. FLUOROSCOPY TIME:  1 minutes 11 seconds IMPRESSION: Intrathecal injection of chemotherapy without complication Electronically Signed   By: Suzy Bouchard M.D.   On: 09/09/2015 11:08   Dg Fluoro Guide Lumbar Puncture  08/30/2015  CLINICAL DATA:  History of Burkitt's lymphoma. Diagnostic lumbar puncture with intrathecal chemotherapy injection. Subsequent encounter. EXAM: DIAGNOSTIC LUMBAR PUNCTURE UNDER FLUOROSCOPIC GUIDANCE FLUOROSCOPY TIME:  Radiation Exposure Index (as provided by the fluoroscopic device): If the device does not provide the exposure index: Fluoroscopy Time (in minutes and seconds):  44 seconds Number of Acquired Images:  0. PROCEDURE: Informed consent was obtained from the patient prior to the procedure, including potential complications of headache, allergy, and pain. With the patient prone, the lower back was prepped with Betadine. 1% Lidocaine was used for local anesthesia. Lumbar puncture was performed at the L5-S1  level using a 20 gauge needle with return of initially blood tinged but clearing. Fourteen ml of CSF were obtained for laboratory studies. Laboratory prepared methotrexate was injected into the subarachnoid space without difficulty. The patient tolerated the procedure well and there were no apparent complications. IMPRESSION: Diagnostic lumbar puncture and intrathecal injection of methotrexate without complication. Electronically Signed   By: Inge Rise M.D.   On: 08/30/2015 15:44   Dg Fluoro Guide Lumbar Puncture  08/26/2015  CLINICAL DATA:  Diagnostic lumbar puncture with intrathecal chemotherapy injection EXAM: FLUOROSCOPICALLY GUIDED LUMBAR PUNCTURE FOR INTRATHECAL CHEMOTHERAPY FLUOROSCOPY TIME:  dictate in minutes and seconds Radiation Exposure Index (as provided by the fluoroscopic device): Not available on this device If the device does not provide the exposure index: Fluoroscopy Time (in minutes and seconds):  1 minutes 32 seconds Number of Acquired Images:  1 PROCEDURE: Informed consent was obtained from the patient prior to the procedure, including potential complications of headache, allergy, and pain. With the patient prone, the lower back was prepped with Betadine. 1% Lidocaine was used for local anesthesia. Lumbar puncture was performed at the L3-4 level without success, likely due to ligamentous ossification. After numbing, attempt was then made at the L2-3 level, with dry tap despite good needle placement. After numbing, needle placed at the L5-S1 level with easy access to somewhat low pressure CSF (patient NPO for port placement this am). A 20 gauge needle was used. 11 cc of clear  CSF were obtained for laboratory studies. The entire syringe of laboratory prepared methotrexate was then injected into the subarachnoid space. The patient tolerated the procedure well without apparent complication. IMPRESSION: Diagnostic lumbar puncture and intrathecal injection of chemotherapy without  complication. Electronically Signed   By: Monte Fantasia M.D.   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 Her CSF cytology was negative though her MRI showed diffuse leptomeningeal enhancement. Patient is status post first cycle of dose adjusted EPOCH-R and has received 3 weekly doses of intrathecal methotrexate plus hydrocortisone. Her EPOCH-R was dose reduced where day 4 of infusional doxorubicin/etoposide/vincristine was held and cyclophosphamide dose was also reduced. 2] protein calorie malnutrition -albumin level seemed to be improving. 3] hypogammaglobinemia due to [place] -Patient's labs today are stable. Her performance status certainly is improved since she was last seen in the clinic about a week ago. -Will proceed with second cycle of daEPOCH-R from today - intrathecal methotrexate with hydrocortisone on day 1 and day 5 - Will continue to follow daily  -Will get LDH, uric acid, magnesium, phosphorus today  -Daily CBC, CMP, uric acid  -Will likely been the hospital for 5-6 days if no new concerns. Will need outpatient Neulasta day 7. -Will come back for her sixth dose of intrathecal methotrexate on day 14 cycle 2. -We will get a repeat PET CT scan and MRI of the brain prior to cycle 3  -After 6 weekly doses of intrathecal methotrexate will likely switch to every 2 weeks intrathecal methotrexate worse his monthly maintenance depending on MRI of the brain and patient's preference . -Will continue follow daily   All of the patients questions were answerto her apparent satisfaction. The patient knows to call the clinic with any problems, questions or concerns.  I spent 45 minutes counseling the patient face to face. The total time spent in the appointment was 60 minutes and more than 50% was on counseling and direct patient cares and counseling with the  pharmacist interventional radiology and remaining care team.     Sullivan Lone MD [place] AAHIVMS [facility] Hematology/Oncology Physician [place]  (Office):       [phone] (Work cell):  [phone] (Fax):           [phone]  09/16/2015 10:57 AM

## 2015-09-16 NOTE — Progress Notes (Signed)
Manual calculation of BSA and dosing for doxorubicin, etoposide, and vincristine completed.  Secondary verification by Lottie Dawson RN.

## 2015-09-17 ENCOUNTER — Encounter: Payer: Self-pay | Admitting: Hematology

## 2015-09-17 LAB — COMPREHENSIVE METABOLIC PANEL
ALBUMIN: 2.3 g/dL — AB (ref 3.5–5.0)
ALK PHOS: 72 U/L (ref 38–126)
ALK PHOS: 85 U/L (ref 38–126)
ALT: 14 U/L (ref 14–54)
ALT: 18 U/L (ref 14–54)
AST: 8 U/L — AB (ref 15–41)
AST: 15 U/L (ref 15–41)
Albumin: 2.8 g/dL — ABNORMAL LOW (ref 3.5–5.0)
Anion gap: 7 (ref 5–15)
Anion gap: 8 (ref 5–15)
BUN: 9 mg/dL (ref 6–20)
BUN: 11 mg/dL (ref 6–20)
CALCIUM: 8.2 mg/dL — AB (ref 8.9–10.3)
CALCIUM: 8.4 mg/dL — AB (ref 8.9–10.3)
CO2: 27 mmol/L (ref 22–32)
CO2: 27 mmol/L (ref 22–32)
CREATININE: 0.6 mg/dL (ref 0.44–1.00)
CREATININE: 0.72 mg/dL (ref 0.44–1.00)
Chloride: 102 mmol/L (ref 101–111)
Chloride: 102 mmol/L (ref 101–111)
GFR calc Af Amer: >60 mL/min (ref >60)
GFR calc non Af Amer: >60 mL/min (ref >60)
GLUCOSE: 148 mg/dL — AB (ref 65–99)
Glucose, Bld: 182 mg/dL — ABNORMAL HIGH (ref 65–99)
Potassium: 4.2 mmol/L (ref 3.5–5.1)
Potassium: 3.7 mmol/L (ref 3.5–5.1)
SODIUM: 136 mmol/L (ref 135–145)
Sodium: 137 mmol/L (ref 135–145)
Total Bilirubin: 0.5 mg/dL (ref 0.3–1.2)
Total Bilirubin: 0.6 mg/dL (ref 0.3–1.2)
Total Protein: 4.7 g/dL — ABNORMAL LOW (ref 6.5–8.1)
Total Protein: 5.4 g/dL — ABNORMAL LOW (ref 6.5–8.1)

## 2015-09-17 LAB — CBC WITH DIFFERENTIAL/PLATELET
BASOS PCT: 0 %
Basophils Absolute: 0 10*3/uL (ref 0.0–0.1)
Basophils Absolute: 0 10*3/uL (ref 0.0–0.1)
Basophils Relative: 1 %
EOS ABS: 0 10*3/uL (ref 0.0–0.7)
EOS PCT: 0 %
Eosinophils Absolute: 0 10*3/uL (ref 0.0–0.7)
Eosinophils Relative: 0 %
HCT: 22 % — ABNORMAL LOW (ref 36.0–46.0)
HCT: 26.1 % — ABNORMAL LOW (ref 36.0–46.0)
Hemoglobin: 7.2 g/dL — ABNORMAL LOW (ref 12.0–15.0)
Hemoglobin: 8.5 g/dL — ABNORMAL LOW (ref 12.0–15.0)
LYMPHS ABS: 1.1 10*3/uL (ref 0.7–4.0)
LYMPHS PCT: 28 %
Lymphocytes Relative: 19 %
Lymphs Abs: 0.6 10*3/uL — ABNORMAL LOW (ref 0.7–4.0)
MCH: 30.5 pg (ref 26.0–34.0)
MCH: 30.8 pg (ref 26.0–34.0)
MCHC: 32.7 g/dL (ref 30.0–36.0)
MCHC: 32.6 g/dL (ref 30.0–36.0)
MCV: 93.2 fL (ref 78.0–100.0)
MCV: 94.6 fL (ref 78.0–100.0)
MONO ABS: 0.3 10*3/uL (ref 0.1–1.0)
MONOS PCT: 8 %
MONOS PCT: 4 %
Monocytes Absolute: 0.1 10*3/uL (ref 0.1–1.0)
Neutro Abs: 2.3 10*3/uL (ref 1.7–7.7)
Neutro Abs: 2.5 10*3/uL (ref 1.7–7.7)
Neutrophils Relative %: 72 %
Neutrophils Relative %: 67 %
PLATELETS: 245 10*3/uL (ref 150–400)
Platelets: 189 10*3/uL (ref 150–400)
RBC: 2.36 MIL/uL — ABNORMAL LOW (ref 3.87–5.11)
RBC: 2.76 MIL/uL — AB (ref 3.87–5.11)
RDW: 14.8 % (ref 11.5–15.5)
RDW: 15 % (ref 11.5–15.5)
WBC: 3.2 10*3/uL — ABNORMAL LOW (ref 4.0–10.5)
WBC: 3.8 10*3/uL — AB (ref 4.0–10.5)

## 2015-09-17 LAB — URIC ACID: URIC ACID, SERUM: 3.1 mg/dL (ref 2.3–6.6)

## 2015-09-17 MED ORDER — VINCRISTINE SULFATE CHEMO INJECTION 1 MG/ML
Freq: Once | INTRAVENOUS | Status: AC
  Administered 2015-09-17: 15:00:00 via INTRAVENOUS
  Filled 2015-09-17: qty 10

## 2015-09-17 MED ORDER — POLYETHYLENE GLYCOL 3350 17 G PO PACK
17.0000 g | PACK | Freq: Every day | ORAL | Status: DC
  Administered 2015-09-17 – 2015-09-20 (×4): 17 g via ORAL
  Filled 2015-09-17 (×4): qty 1

## 2015-09-17 MED ORDER — DEXAMETHASONE SODIUM PHOSPHATE 4 MG/ML IJ SOLN
8.0000 mg | Freq: Once | INTRAMUSCULAR | Status: AC
  Administered 2015-09-17: 8 mg via INTRAVENOUS
  Filled 2015-09-17: qty 2

## 2015-09-17 MED ORDER — PALONOSETRON HCL INJECTION 0.25 MG/5ML
0.2500 mg | Freq: Once | INTRAVENOUS | Status: AC
  Administered 2015-09-17: 0.25 mg via INTRAVENOUS
  Filled 2015-09-17: qty 5

## 2015-09-17 MED FILL — Methotrexate Sodium Inj PF 50 MG/2ML (25 MG/ML): INTRAMUSCULAR | Qty: 12 | Status: AC

## 2015-09-17 NOTE — Progress Notes (Signed)
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 Da EPOCH-R 2. IT methotrexate + Hydrocortisone qweekly  INTERVAL HISTORY:  Kristen Baker is here for followup on day 14 after her first cycle of dose adjusted EPOCH-R4 newly diagnosed Burkitt's lymphoma with leptomeningeal involvement.  She has been getting weekly intrathecal methotrexate and received her third dose this morning.  Notes some nausea and some fatigue but otherwise is doing okay.  He did visit the emergency room over the weekend for fatigue and symptomatic anemia and received blood transfusions with improvement in her symptoms.  Needs a fair amount of encouragement for adequate oral intake which she has been good and her best to maintain.  No fevers or chills.  Night sweats are resolved.  No headaches.  No new focal neurological deficits.  Mild improvement in her chin numbness.  REVIEW OF SYSTEMS:    10 Point review of systems of done and is negative except as noted above.  . Past Medical History  Diagnosis Date  . Cancer (HCC)     cervical cancer  . Burkitt's lymphoma ([place]) 08/29/2015    . Past Surgical History  Procedure Laterality Date  . Cholecystectomy    . Tonsillectomy    . Abdominal hysterectomy    . Abdominal surgery      2-3 rd of colon removed    . Social History  Substance Use Topics  . Smoking status: Never Smoker   . Smokeless tobacco: Never Used  . Alcohol Use: No    ALLERGIES:  is allergic to cabbage; codeine; onion; shellfish allergy; sulfa antibiotics; and zofran.  MEDICATIONS:  Medications reviewed in Epic  PHYSICAL EXAMINATION: ECOG PERFORMANCE STATUS: 2 - Symptomatic, <50% confined to bed  . Filed Vitals:   09/09/15 1610  BP: 121/47  Pulse: 71  Temp: 97.7 F (36.5 C)  Resp: 18     Filed Weights   09/09/15 1610  Weight: 174 lb 14.4 oz (79.334 kg)   .Body mass index is 28.24 kg/(m^2).  GENERAL:alert,elderly lady in no acute distress SKIN: no acute skin rashes EYES: normal, conjunctiva are pink and non-injected, sclera clear OROPHARYNX:no exudate, no erythema and lips, buccal mucosa, and tongue normal  NECK: supple, no JVD, thyroid normal size, non-tender, without nodularity LYMPH:  no palpable lymphadenopathy in the cervical, axillary or inguinal LUNGS: clear to auscultation with normal respiratory effort HEART: regular rate & rhythm,  no murmurs and no lower extremity edema ABDOMEN: abdomen soft, non-tender, normoactive bowel sounds  Musculoskeletal: no cyanosis of digits and no clubbing  PSYCH: alert & oriented x 3 with fluent speech NEURO: no focal motor/sensory deficits  LABORATORY DATA:   I have reviewed the data as listed Component     Latest Ref Rng 09/06/2015 09/09/2015  WBC     3.9 - 10.3 10e3/uL 0.2 (LL) 9.6  NEUT#     1.5 - 6.5 10e3/uL  9.0 (H)  Hemoglobin     11.6 - 15.9 g/dL 7.6 (L) 9.7 (L)  HCT     34.8 - 46.6 % 22.4 (L) 29.4 (L)  Platelets     145 - 400 10e3/uL 20 (LL) 56 (L)  MCV     79.5 - 101.0 fL 87.5 89.6  MCH     25.1 - 34.0 pg 29.7 29.8  MCHC     31.5 -  36.0 g/dL 33.9 33.2  RBC     3.70 - 5.45 10e6/uL 2.56 (L) 3.28 (L)  RDW     11.2 - 14.5 % 15.0 14.8 (H)  lymph#     0.9 - 3.3 10e3/uL  0.2 (L)  MONO#     0.1 - 0.9 10e3/uL  0.4  Eosinophils Absolute     0.0 - 0.5 10e3/uL  0.0  Basophils Absolute     0.0 - 0.1 10e3/uL  0.0  NEUT%     38.4 - 76.8 %  93.7 (H)  LYMPH%     14.0 - 49.7 %  2.5 (L)  MONO%     0.0 - 14.0 %  3.6  EOS%     0.0 - 7.0 %  0.1  BASO%     0.0 - 2.0 %  0.1  Retic %     0.70 - 2.10 %  0.82  Retic Ct Abs     33.70 - 90.70 10e3/uL  26.90 (L)  Immature Retic Fract     1.60 - 10.00 %  20.30 (H)  Sodium     135 - 145 mmol/L 132 (L) 136  Potassium     3.5 - 5.1 mmol/L 3.5 4.2  Chloride      101 - 111 mmol/L 98 (L) 103  CO2     22 - 32 mmol/L 27 27  Glucose     65 - 99 mg/dL 162 (H) 196 (H)  BUN     6 - 20 mg/dL 17 18.9  Creatinine     0.44 - 1.00 mg/dL 0.69 0.7  Calcium     8.9 - 10.3 mg/dL 8.0 (L) 8.3 (L)  Total Protein     6.5 - 8.1 g/dL 5.0 (L) 4.8 (L)  Albumin     3.5 - 5.0 g/dL 2.8 (L) 2.5 (L)  AST     15 - 41 U/L 8 (L) <7  ALT     14 - 54 U/L 21 16  Alkaline Phosphatase     38 - 126 U/L 63 79  Total Bilirubin     0.3 - 1.2 mg/dL 0.8 0.54  EGFR (Non-African Amer.)     >60 mL/min >60   EGFR (African American)     >60 mL/min >60   Anion gap     5 - _0 EGFR     >90 ml/min/1.73 m2  83 (L)   . RADIOGRAPHIC STUDIES: I have personally reviewed the radiological images as listed and agreed with the findings in the report. Dg Chest 2 View  09/06/2015  CLINICAL DATA:  Onset of weakness last night, generalized weakness throughout body, recently diagnosed with cervical cancer with first chemotherapy treatment last week, neutropenia, a intermittent midline lower abdominal pain EXAM: CHEST  2 VIEW COMPARISON:  08/18/2015 FINDINGS: RIGHT jugular Port-A-Cath with tip projecting over SVC. Normal heart size, mediastinal contours and pulmonary vascularity. Atherosclerotic calcification aorta. Lungs clear. No pleural effusion or pneumothorax. Bones demineralized. IMPRESSION: No acute abnormalities. Electronically Signed   By: Lavonia Dana M.D.   On: 09/06/2015 14:17   Ct Chest Wo Contrast  08/19/2015  CLINICAL DATA:  81 year old female with abnormal CT Abdomen and Pelvis, possible lymphoma. Initial encounter. EXAM: CT CHEST WITHOUT CONTRAST TECHNIQUE: Multidetector CT imaging of the chest was performed following the standard protocol without IV contrast. COMPARISON:  CT Abdomen and Pelvis 08/18/2015. Chest and abdominal radiographs 08/18/2015. FINDINGS: Noncontrast exam. Stable abnormal inferior posterior mediastinum and retrocrural soft  tissue as described recently. No  pericardial effusion. No pleural effusion. Mediastinal and axillary lymph nodes are normal. Thoracic inlet lymph nodes are normal. Calcified aortic and coronary artery atherosclerosis. Major airways are patent. There are occasional calcified granulomas. There is right upper lobe bronchiectasis with mild patchy peribronchial opacity along the major fissure (series 305, image 16). Mild tree-in-bud nodular opacity along the inferior lateral right upper lobe near the minor fissure (image 24). Minor dependent atelectasis. Degenerative changes in the thoracic spine. No acute or suspicious osseous lesion in the chest. IMPRESSION: 1. Abnormal posterior mediastinal/retrocrural soft tissue as seen on the recent CT Abdomen and Pelvis. Mediastinal, axillary, and thoracic inlet lymph nodes are normal. 2. Right upper lobe bronchiectasis and peripheral / peribronchial opacity compatible with distal airway infection or less likely scarring. Electronically Signed   By: Genevie Ann M.D.   On: 08/19/2015 23:39   [name] XB Contrast  08/19/2015  CLINICAL DATA:  Headache.  Possible lymphoma. EXAM: MRI HEAD WITHOUT AND WITH CONTRAST TECHNIQUE: Multiplanar, multiecho pulse sequences of the brain and surrounding structures were obtained without and with intravenous contrast. CONTRAST:  69m MULTIHANCE GADOBENATE DIMEGLUMINE 529 MG/ML IV SOLN COMPARISON:  None. FINDINGS: Ventricle size normal. Cerebral volume normal. Pituitary normal in size. Craniocervical junction normal. Negative for acute infarct. Minimal hyperintensity in the periventricular white matter may represent chronic ischemia. Negative for hemorrhage or fluid collection. Negative for edema in the brain Postcontrast imaging reveals leptomeningeal enhancement diffusely and bilaterally. There is mild smooth enhancement of the meninges without nodularity. No enhancing mass lesion in the brain. IMPRESSION: Diffuse leptomeningeal enhancement. Given the history, this is concerning  for lymphomatous involvement. Lumbar puncture with cytology recommended. Negative for acute infarct or mass lesion. Electronically Signed   By: CFranchot GalloM.D.   On: 08/19/2015 20:47   Ct Abdomen Pelvis W Contrast  08/18/2015  CLINICAL DATA:  81year old female with lower abdominal pain nausea and vomiting for 1 month. Initial encounter. EXAM: CT ABDOMEN AND PELVIS WITH CONTRAST TECHNIQUE: Multidetector CT imaging of the abdomen and pelvis was performed using the standard protocol following bolus administration of intravenous contrast. CONTRAST:  241mOMNIPAQUE IOHEXOL 300 MG/ML SOLN, 10064mMNIPAQUE IOHEXOL 300 MG/ML SOLN [facility]PAR[facility]:  [facility]. Acute abdominal series from today FINDINGS: Mild respiratory motion artifact at the lung bases. There are chronic surgical clips about the distal thoracic esophagus and gastroesophageal junction. New since 2014 in the posterior mediastinum and tracking toward the retrocrural space is abnormal confluent soft tissue anterior to the thoracic spine and inseparable from the medial wall of the descending thoracic aorta measuring 3 x 44 x 54 mm (AP by transverse by CC). This seems to be separate from both the aorta and esophagus. There is no anterior thoracic spine erosion. Calcified plaque along this segment of the aorta. No associated pericardial or pleural effusion. Mild bronchiectasis at both lung bases with no confluent pulmonary opacity. Degenerative changes in the spine. Chronic 10 mm lucent area in the left T12 vertebral body is stable and most resembled hemangioma in 2014. No acute or suspicious osseous lesion is identified however, there is subtle increased ventral epidural soft tissue seen in the sacrum -sagittal image 73. Mild to moderate nonspecific presacral stranding. No pelvic free fluid. Negative rectum with retained stool. Uterus surgically absent. Adnexa within normal limits. Numerous pelvic phleboliths. Negative urinary  bladder. Redundant sigmoid colon. Proximal sigmoid and left colon diverticulosis with no active inflammation identified. Negative transverse colon. Negative right  colon. Ileocecal valve lipoma incidentally noted. Appendix diminutive or absent. Negative terminal ileum. No dilated or abnormal small bowel loops. Occasional surgical clips in the greater omentum. Diminutive stomach. Duodenum within normal limits. Major arterial structures are patent in the abdomen and pelvis with fairly extensive calcified aortic atherosclerosis. Portal venous system appears to be patent. Retroperitoneal lymphadenopathy maximal at the lower lumbar spine level anterior to the IVC measuring up to 22 mm short axis. Numerous increased para renal and other retroperitoneal space nodes are individually up to 14 mm short axis. Mesenteric nodes in the abdomen and pelvis have a more normal appearance. No pelvic sidewall or inguinal lymphadenopathy identified. Solitary small nonspecific low-density area in the right hepatic lobe measuring 10 mm on series 2, image 20, favor benign. Gallbladder not identified and felt to be surgically absent. No splenomegaly or splenic lesion. Negative pancreas and adrenal glands. Bilateral renal enhancement and contrast excretion within normal limits. No abdominal free fluid. IMPRESSION: 1. Retroperitoneal and lower posterior mediastinal / retrocrural soft tissue masses most compatible with lymphadenopathy, and most suggestive of Lymphoma. Some of these might be amenable to CT-guided biopsy, uncertain. 2. Subtle increased sacral epidural soft tissue, but no destructive osseous lesion identified. Nonspecific presacral stranding. Metastatic disease to the spine not excluded. Electronically Signed   By: Genevie Ann M.D.   On: 08/18/2015 18:52   Nm Pet Image Initial (pi) Skull Base To Thigh  08/27/2015  CLINICAL DATA:  Initial treatment strategy for lymphoma. EXAM: NUCLEAR MEDICINE PET VERTEX TO THIGH TECHNIQUE: 8.75  mCi F-18 FDG was injected intravenously. Full-ring PET imaging was performed from the vertex to thigh after the radiotracer. CT data was obtained and used for attenuation correction and anatomic localization. FASTING BLOOD GLUCOSE:  Value: 126 mg/dl COMPARISON:  08/19/2015 FINDINGS: HEAD AND NECK No abnormal radiotracer accumulation identified within the brain. A small amount of gas is noted within the anterior horn of the left lateral ventricle. No hypermetabolic lymph nodes in the neck. CHEST Hypermetabolic posterior mediastinal soft tissue at the level of the thigh for ppm attic hiatus is identified. This measures 2 x 2.5 cm and has an SUV max equal to 6.9. No enlarged or hypermetabolic mediastinal or hilar lymph nodes. No hypermetabolic axillary or supraclavicular adenopathy. No suspicious pulmonary nodules identified on the CT images. Calcified granuloma is identified within the posterior left upper lobe. ABDOMEN/PELVIS No abnormal hypermetabolic activity within the liver, pancreas, adrenal glands, or spleen. Prominent retroperitoneal lymph nodes are identified. Index pre caval node just above the aortic bifurcation measures 1.3 cm and has an SUV max equal to 3.50. Increased presacral soft tissue is identified which exhibits mild increased uptake. This measures 1.x 5.1 x 4.0 cm. The SUV max within this area is equal to 4.13. SKELETON Diffuse heterogeneous tracer activity is identified throughout the bone marrow of the axial and proximal appendicular skeleton. More focal areas of intense uptake are noted within the thoracic spine, lumbar spine and pelvis. For example cuff within the L3 vertebra there is an area of increased uptake which has an SUV max equal to 6.38. IMPRESSION: 1. Abnormal soft tissue within the posterior mediastinum exhibits malignant range FDG uptake and is compatible with clinical history of lymphoma. 2. There are a few enlarged retroperitoneal lymph nodes within the lower abdomen which  exhibits nonspecific FDG uptake. Additionally, there is mild increased presacral soft tissue exhibiting low level FDG uptake. 3. Diffuse heterogeneous uptake throughout the bone marrow is noted and is worrisome for lymphomatous involvement. Electronically  Signed   By: Kerby Moors M.D.   On: 08/27/2015 11:50   Ir Fluoro Guide Cv Line Right  08/26/2015  CLINICAL DATA:  ACCESS FOR CHEMOTHERAPY, BURKITT'S LYMPHOMA EXAM: RIGHT INTERNAL JUGULAR SINGLE LUMEN POWER PORT CATHETER INSERTION Date:  10/17/201610/17/2016 11:08 am Radiologist:  M. Daryll Brod, MD Guidance:  Ultrasound and fluoroscopic FLUOROSCOPY TIME:  30 seconds, 3 mGy MEDICATIONS AND MEDICAL HISTORY: 2 g Ancefadministered within 1 hour of the procedure.3 mg Versed, 50 mcg fentanyl ANESTHESIA/SEDATION: 30 minutes CONTRAST:  None. COMPLICATIONS: None immediate PROCEDURE: Informed consent was obtained from the patient following explanation of the procedure, risks, benefits and alternatives. The patient understands, agrees and consents for the procedure. All questions were addressed. A time out was performed. Maximal barrier sterile technique utilized including caps, mask, sterile gowns, sterile gloves, large sterile drape, hand hygiene, and 2% chlorhexidine scrub. Under sterile conditions and local anesthesia, right internal jugular micropuncture venous access was performed. Access was performed with ultrasound. Images were obtained for documentation. A guide wire was inserted followed by a transitional dilator. This allowed insertion of a guide wire and catheter into the IVC. Measurements were obtained from the SVC / RA junction back to the right IJ venotomy site. In the right infraclavicular chest, a subcutaneous pocket was created over the second anterior rib. This was done under sterile conditions and local anesthesia. 1% lidocaine with epinephrine was utilized for this. A 2.5 cm incision was made in the skin. Blunt dissection was performed to create  a subcutaneous pocket over the right pectoralis major muscle. The pocket was flushed with saline vigorously. There was adequate hemostasis. The port catheter was assembled and checked for leakage. The port catheter was secured in the pocket with two retention sutures. The tubing was tunneled subcutaneously to the right venotomy site and inserted into the SVC/RA junction through a valved peel-away sheath. Position was confirmed with fluoroscopy. Images were obtained for documentation. The patient tolerated the procedure well. No immediate complications. Incisions were closed in a two layer fashion with 4 - 0 Vicryl suture. Dermabond was applied to the skin. The port catheter was accessed, blood was aspirated followed by saline and heparin flushes. Needle was removed. A dry sterile dressing was applied. IMPRESSION: Ultrasound and fluoroscopically guided right internal jugular single lumen power port catheter insertion. Tip in the SVC/RA junction. Catheter ready for use. Electronically Signed   By: Jerilynn Mages.  Shick M.D.   On: 08/26/2015 11:44   Ir US Guide Vasc Access Right  08/26/2015  CLINICAL DATA:  ACCESS FOR CHEMOTHERAPY, BURKITT'S LYMPHOMA EXAM: RIGHT INTERNAL JUGULAR SINGLE LUMEN POWER PORT CATHETER INSERTION Date:  10/17/201610/17/2016 11:08 am Radiologist:  M. Daryll Brod, MD Guidance:  Ultrasound and fluoroscopic FLUOROSCOPY TIME:  30 seconds, 3 mGy MEDICATIONS AND MEDICAL HISTORY: 2 g Ancefadministered within 1 hour of the procedure.3 mg Versed, 50 mcg fentanyl ANESTHESIA/SEDATION: 30 minutes CONTRAST:  None. COMPLICATIONS: None immediate PROCEDURE: Informed consent was obtained from the patient following explanation of the procedure, risks, benefits and alternatives. The patient understands, agrees and consents for the procedure. All questions were addressed. A time out was performed. Maximal barrier sterile technique utilized including caps, mask, sterile gowns, sterile gloves, large sterile drape, hand  hygiene, and 2% chlorhexidine scrub. Under sterile conditions and local anesthesia, right internal jugular micropuncture venous access was performed. Access was performed with ultrasound. Images were obtained for documentation. A guide wire was inserted followed by a transitional dilator. This allowed insertion of a guide wire and catheter into the  IVC. Measurements were obtained from the SVC / RA junction back to the right IJ venotomy site. In the right infraclavicular chest, a subcutaneous pocket was created over the second anterior rib. This was done under sterile conditions and local anesthesia. 1% lidocaine with epinephrine was utilized for this. A 2.5 cm incision was made in the skin. Blunt dissection was performed to create a subcutaneous pocket over the right pectoralis major muscle. The pocket was flushed with saline vigorously. There was adequate hemostasis. The port catheter was assembled and checked for leakage. The port catheter was secured in the pocket with two retention sutures. The tubing was tunneled subcutaneously to the right venotomy site and inserted into the SVC/RA junction through a valved peel-away sheath. Position was confirmed with fluoroscopy. Images were obtained for documentation. The patient tolerated the procedure well. No immediate complications. Incisions were closed in a two layer fashion with 4 - 0 Vicryl suture. Dermabond was applied to the skin. The port catheter was accessed, blood was aspirated followed by saline and heparin flushes. Needle was removed. A dry sterile dressing was applied. IMPRESSION: Ultrasound and fluoroscopically guided right internal jugular single lumen power port catheter insertion. Tip in the SVC/RA junction. Catheter ready for use. Electronically Signed   By: Jerilynn Mages.  Shick M.D.   On: 08/26/2015 11:44   Ct Biopsy  08/21/2015  CLINICAL DATA:  Suspected lymphoma EXAM: CT-GUIDED BIOPSY BONE MARROW AND PARA-AORTIC LYMPH NODE. MEDICATIONS AND MEDICAL HISTORY:  Versed 2 mg, Fentanyl 100 mcg. Additional Medications: None. ANESTHESIA/SEDATION: Moderate sedation time: Third minutes PROCEDURE: The procedure, risks, benefits, and alternatives were explained to the patient. Questions regarding the procedure were encouraged and answered. The patient understands and consents to the procedure. The back was prepped with Betadine in a sterile fashion, and a sterile drape was applied covering the operative field. A sterile gown and sterile gloves were used for the procedure. Under CT guidance, an 11 gauge needle was inserted into the right iliac bone via posterior approach. Aspirates and a core were obtained Under CT guidance, an 17 gauge needle was inserted into the para caval lymph node via right posterior lateral approach. Four 18 gauge core biopsies were obtained. The guide needle was removed. Patient tolerated the procedure well without complication. Vital sign monitoring by nursing staff during the procedure will continue as patient is in the special procedures unit for post procedure observation. FINDINGS: The images document guide needle placement within the right iliac bone and pericaval lymph node. Post biopsy images demonstrate no hemorrhage. COMPLICATIONS: None IMPRESSION: Successful CT-guided bone marrow aspirate, bone marrow core, and pericaval lymph node core which was placed in saline for lymphoma flow analysis. Electronically Signed   By: Marybelle Killings M.D.   On: 08/21/2015 14:10   Ct Biopsy  08/21/2015  CLINICAL DATA:  Suspected lymphoma EXAM: CT-GUIDED BIOPSY BONE MARROW AND PARA-AORTIC LYMPH NODE. MEDICATIONS AND MEDICAL HISTORY: Versed 2 mg, Fentanyl 100 mcg. Additional Medications: None. ANESTHESIA/SEDATION: Moderate sedation time: Third minutes PROCEDURE: The procedure, risks, benefits, and alternatives were explained to the patient. Questions regarding the procedure were encouraged and answered. The patient understands and consents to the procedure. The back  was prepped with Betadine in a sterile fashion, and a sterile drape was applied covering the operative field. A sterile gown and sterile gloves were used for the procedure. Under CT guidance, an 11 gauge needle was inserted into the right iliac bone via posterior approach. Aspirates and a core were obtained Under CT guidance, an 17 gauge  needle was inserted into the para caval lymph node via right posterior lateral approach. Four 18 gauge core biopsies were obtained. The guide needle was removed. Patient tolerated the procedure well without complication. Vital sign monitoring by nursing staff during the procedure will continue as patient is in the special procedures unit for post procedure observation. FINDINGS: The images document guide needle placement within the right iliac bone and pericaval lymph node. Post biopsy images demonstrate no hemorrhage. COMPLICATIONS: None IMPRESSION: Successful CT-guided bone marrow aspirate, bone marrow core, and pericaval lymph node core which was placed in saline for lymphoma flow analysis. Electronically Signed   By: Marybelle Killings M.D.   On: 08/21/2015 14:10   Dg Abd Acute W/chest  08/18/2015  CLINICAL DATA:  Chest pain and abdominal pain. EXAM: DG ABDOMEN ACUTE W/ 1V CHEST COMPARISON:  Chest x-ray dated 12/12/2012 performed at [facility]: Heart size and pulmonary vascularity are normal and the lungs are clear. Surgical clips at the gastroesophageal junction. Slight thoracolumbar scoliosis. No free air or free fluid in the abdomen. Bowel gas pattern is normal. Surgical clips and staples in the abdomen. Multiple phleboliths in the pelvis. No acute osseous abnormality. IMPRESSION: Negative abdominal radiographs.  No acute cardiopulmonary disease. Electronically Signed   By: Lorriane Shire M.D.   On: 08/18/2015 17:17   Dg Fluoro Guide Lumbar Puncture  09/16/2015  CLINICAL DATA:  Lymphoma, methotrexate injection. EXAM: FLUOROSCOPICALLY GUIDED LUMBAR PUNCTURE FOR  INTRATHECAL CHEMOTHERAPY TECHNIQUE: Informed consent was obtained from the patient prior to the procedure, including potential complications of headache, allergy, and pain. A 'time out' was performed. With the patient prone, the lower back was prepped with Betadine. 1% Lidocaine was used for local anesthesia. Lumbar puncture was performed at the L5-S1 level using a 20 gauge needle with return of clear CSF. 12 mg Of methotrexate was injected into the subarachnoid space. The patient tolerated the procedure well without apparent complication. FLUOROSCOPY TIME:  Fluoroscopy Time (in minutes and seconds): 0 minutes 40 seconds. Number of Acquired Images:  None. IMPRESSION: 1. Intrathecal injection of chemotherapy without complication. 2. Due to extremely slow flow, less than 1 cc CSF was collected for laboratory evaluation. Electronically Signed   By: Lorin Picket M.D.   On: 09/16/2015 16:18   Dg Fluoro Guide Lumbar Puncture  09/09/2015  CLINICAL DATA:  Burkitt's lymphoma with leptomeningeal involvement. Third dose intrathecal chemotherapy EXAM: FLUOROSCOPICALLY GUIDED LUMBAR PUNCTURE FOR INTRATHECAL CHEMOTHERAPY TECHNIQUE: Informed consent was obtained from the patient prior to the procedure, including potential complications of headache, allergy, and pain. A 'time out' was performed. With the patient prone, the lower back was prepped with Betadine. 1% Lidocaine was used for local anesthesia. Lumbar puncture was performed at the L5-S1 using a 22 gauge needle with return of clear CSF. 12 mg methotrexate and 50 mg hydrocortisone pharmacy prepared was injected into the subarachnoid space. The patient tolerated the procedure well without apparent complication. FLUOROSCOPY TIME:  1 minutes 11 seconds IMPRESSION: Intrathecal injection of chemotherapy without complication Electronically Signed   By: Suzy Bouchard M.D.   On: 09/09/2015 11:08   Dg Fluoro Guide Lumbar Puncture  08/30/2015  CLINICAL DATA:  History of  Burkitt's lymphoma. Diagnostic lumbar puncture with intrathecal chemotherapy injection. Subsequent encounter. EXAM: DIAGNOSTIC LUMBAR PUNCTURE UNDER FLUOROSCOPIC GUIDANCE FLUOROSCOPY TIME:  Radiation Exposure Index (as provided by the fluoroscopic device): If the device does not provide the exposure index: Fluoroscopy Time (in minutes and seconds):  44 seconds Number of Acquired Images:  0.  PROCEDURE: Informed consent was obtained from the patient prior to the procedure, including potential complications of headache, allergy, and pain. With the patient prone, the lower back was prepped with Betadine. 1% Lidocaine was used for local anesthesia. Lumbar puncture was performed at the L5-S1 level using a 20 gauge needle with return of initially blood tinged but clearing. Fourteen ml of CSF were obtained for laboratory studies. Laboratory prepared methotrexate was injected into the subarachnoid space without difficulty. The patient tolerated the procedure well and there were no apparent complications. IMPRESSION: Diagnostic lumbar puncture and intrathecal injection of methotrexate without complication. Electronically Signed   By: Inge Rise M.D.   On: 08/30/2015 15:44   Dg Fluoro Guide Lumbar Puncture  08/26/2015  CLINICAL DATA:  Diagnostic lumbar puncture with intrathecal chemotherapy injection EXAM: FLUOROSCOPICALLY GUIDED LUMBAR PUNCTURE FOR INTRATHECAL CHEMOTHERAPY FLUOROSCOPY TIME:  dictate in minutes and seconds Radiation Exposure Index (as provided by the fluoroscopic device): Not available on this device If the device does not provide the exposure index: Fluoroscopy Time (in minutes and seconds):  1 minutes 32 seconds Number of Acquired Images:  1 PROCEDURE: Informed consent was obtained from the patient prior to the procedure, including potential complications of headache, allergy, and pain. With the patient prone, the lower back was prepped with Betadine. 1% Lidocaine was used for local anesthesia.  Lumbar puncture was performed at the L3-4 level without success, likely due to ligamentous ossification. After numbing, attempt was then made at the L2-3 level, with dry tap despite good needle placement. After numbing, needle placed at the L5-S1 level with easy access to somewhat low pressure CSF (patient NPO for port placement this am). A 20 gauge needle was used. 11 cc of clear CSF were obtained for laboratory studies. The entire syringe of laboratory prepared methotrexate was then injected into the subarachnoid space. The patient tolerated the procedure well without apparent complication. IMPRESSION: Diagnostic lumbar puncture and intrathecal injection of chemotherapy without complication. Electronically Signed   By: Monte Fantasia M.D.   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 Patient with Retroperitoneal and Posterior Mediastinal Lnadenopathy with significantly elevated LDH and leukoerythroblastic picture on peipheral blood smear with multiple large atypical Lymphocytes vs blasts. Patient had significant type B constitutional symptoms which have now resolved . MRI Brain with evidence of leptomeningeal involvement consistent with the patients symptoms of chin numbness though LP was unrevealing. BM packed with lymphoma LDH down from 3000's to 1900 to 1215 to 500's to 368 to 352 to 311 to 243 S/p cycle 1 daEPOCH-R + IT Methotrexate (weekly for now - received 3rd dose today) 2) Chin numbness due to possible leptomeningeal involvement. Patient notes headaches have resolved. No new focal neurological deficits. 3) Thrombocytopenia likely related to lymphoma. Platelets nadired at 20K and have bounced back to 56k today 4) Leukoerythroblastic picture likely from bone marrow Lymphoma involvement. - resolved 4) Abdominal fatigue/back pain - likely from retroperitoneal LNadenopathy - back pain resolved. 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 Plan -patients WBC counts are have bounced back well. PLTs  Appeared to be bouncing back as well.  He did require a RBC transfusion in the ED on 10/28 for symptomatic anemia. -Received her third dose of intrathecal weekly methotrexate plus hydrocortisone today with mild nausea but otherwise tolerated well. -Grade 1-2 fatigue as  expected from chemotherapy. - encouraged increased oral fluid and food intake and ambulation as much as possible after the required bedrest for spinal tap. -patient will be admitted for her second cycle of EPOCH-R on 09/16/2015. -We will continue weekly methotrexate for 6 doses if tolerated. -We'll plan to get  A repeat PET/CT scan and MRI of the brain prior to her third cycle.   I spent 25 minutes counseling the patient face to face. The total time spent in the appointment was 25 minutes and more than 50% was on counseling and direct patient cares.    Sullivan Lone MD [place] AAHIVMS [facility] Hematology/Oncology Physician [facility]  (Office):       [phone] (Work cell):  [phone] (Fax):           [phone]

## 2015-09-17 NOTE — Progress Notes (Signed)
[name]   HEMATOLOGY/ONCOLOGY INPATIENT PROGRESS NOTE  Date of Service: 09/17/2015  Inpatient Attending: .Brunetta Genera, MD   SUBJECTIVE  Kristen Baker was seen this AM. She notes no acute issues. No nausea. No headache. No SOB no chest pain. She notes some constipation. No fevers or chills. Feels okay overall.    OBJECTIVE:   PHYSICAL EXAMINATION: . Filed Vitals:   09/16/15 2132 09/17/15 0604 09/17/15 1255 09/17/15 2050  BP: 116/45 118/48 116/55 111/52  Pulse: 63  66 66  Temp: 97.9 F (36.6 C) 97.8 F (36.6 C) 98.3 F (36.8 C) 98.1 F (36.7 C)  TempSrc: Oral Oral Oral Oral  Resp: 18 16 16 16   Height:  5\' 6"  (1.676 m)    Weight:  164 lb 8 oz (74.617 kg)    SpO2: 96% 98% 98% 97%   Filed Weights   09/16/15 1059 09/17/15 0604  Weight: 166 lb 4.8 oz (75.433 kg) 164 lb 8 oz (74.617 kg)   .Body mass index is 26.56 kg/(m^2).  GENERAL: Elderly lady in no acute distress SKIN: skin color, texture, turgor are normal, no rashes or significant lesions EYES: normal, conjunctiva are pink and non-injected, sclera clear, baseline strabismus. OROPHARYNX:no exudate, no erythema and lips, buccal mucosa, and tongue normal  NECK: supple, no JVD, thyroid normal size, non-tender, without nodularity LYMPH: no palpable lymphadenopathy in the cervical, axillary or inguinal LUNGS: clear to auscultation with normal respiratory effort HEART: regular rate & rhythm, no murmurs and no lower extremity edema ABDOMEN: abdomen soft, non-tender, normoactive bowel sounds  Musculoskeletal: no cyanosis of digits and no clubbing  PSYCH: alert & oriented x 3 with fluent speech NEURO: no focal motor/sensory deficits  MEDICAL HISTORY:  Past Medical History  Diagnosis Date  . Cancer (HCC)     cervical cancer  . Burkitt's lymphoma ([place]) 08/29/2015    SURGICAL HISTORY: Past Surgical History  Procedure Laterality Date  . Cholecystectomy    . Tonsillectomy    . Abdominal hysterectomy    .  Abdominal surgery      2-3 rd of colon removed    SOCIAL HISTORY: Social History   Social History  . Marital Status: Widowed    Spouse Name: N/A  . Number of Children: N/A  . Years of Education: N/A   Occupational History  . Not on file.   Social History Main Topics  . Smoking status: Never Smoker   . Smokeless tobacco: Never Used  . Alcohol Use: No  . Drug Use: No  . Sexual Activity: No   Other Topics Concern  . Not on file   Social History Narrative    FAMILY HISTORY: History reviewed. No pertinent family history.  ALLERGIES:  is allergic to cabbage; codeine; onion; shellfish allergy; sulfa antibiotics; and zofran.  MEDICATIONS:  Scheduled Meds: . DOXOrubicin/vinCRIStine/etoposide CHEMO IV infusion for Inpatient CI   Intravenous Once  . feeding supplement (ENSURE ENLIVE)  237 mL Oral BID BM  . methotrexate INTRATHECAL (+/- HYDROCORTISONE,Ara-C)   Intrathecal Once  . pantoprazole  40 mg Oral Daily  . polyethylene glycol  17 g Oral Daily  . predniSONE  100 mg Oral QAC breakfast  . senna  2 tablet Oral QHS  . [START ON 09/23/2015] Vitamin D (Ergocalciferol)  50,000 Units Oral Q7 days   Continuous Infusions: . sodium chloride 20 mL/hr at 09/16/15 1600   PRN Meds:.acetaminophen, alteplase, alum & mag hydroxide-simeth, Cold Pack, heparin lock flush, heparin lock flush, Hot Pack, lidocaine-prilocaine, prochlorperazine, senna-docusate, sodium chloride, sodium  chloride  REVIEW OF SYSTEMS:    10 Point review of Systems was done is negative except as noted above.   LABORATORY DATA:  I have reviewed the data as listed  . CBC Latest Ref Rng 09/17/2015 09/17/2015 09/16/2015  WBC 4.0 - 10.5 K/uL 3.8(L) 3.2(L) 4.0  Hemoglobin 12.0 - 15.0 g/dL 8.5(L) 7.2(L) 9.2(L)  Hematocrit 36.0 - 46.0 % 26.1(L) 22.0(L) 28.2(L)  Platelets 150 - 400 K/uL 245 189 182    . CMP Latest Ref Rng 09/17/2015 09/17/2015 09/16/2015  Glucose 65 - 99 mg/dL 182(H) 148(H) 151(H)  BUN 6 - 20 mg/dL  11 9 10   Creatinine 0.44 - 1.00 mg/dL 0.72 0.60 0.75  Sodium 135 - 145 mmol/L 137 136 134(L)  Potassium 3.5 - 5.1 mmol/L 3.7 4.2 4.2  Chloride 101 - 111 mmol/L 102 102 99(L)  CO2 22 - 32 mmol/L 27 27 28   Calcium 8.9 - 10.3 mg/dL 8.4(L) 8.2(L) 8.5(L)  Total Protein 6.5 - 8.1 g/dL 5.4(L) 4.7(L) 5.9(L)  Total Bilirubin 0.3 - 1.2 mg/dL 0.6 0.5 0.5  Alkaline Phos 38 - 126 U/L 85 72 86  AST 15 - 41 U/L 15 8(L) 13(L)  ALT 14 - 54 U/L 18 14 17      RADIOGRAPHIC STUDIES: I have personally reviewed the radiological images as listed and agreed with the findings in the report. Dg Chest 2 View  09/06/2015  CLINICAL DATA:  Onset of weakness last night, generalized weakness throughout body, recently diagnosed with cervical cancer with first chemotherapy treatment last week, neutropenia, a intermittent midline lower abdominal pain EXAM: CHEST  2 VIEW COMPARISON:  08/18/2015 FINDINGS: RIGHT jugular Port-A-Cath with tip projecting over SVC. Normal heart size, mediastinal contours and pulmonary vascularity. Atherosclerotic calcification aorta. Lungs clear. No pleural effusion or pneumothorax. Bones demineralized. IMPRESSION: No acute abnormalities. Electronically Signed   By: Lavonia Dana M.D.   On: 09/06/2015 14:17   Ct Chest Wo Contrast  08/19/2015  CLINICAL DATA:  81 year old female with abnormal CT Abdomen and Pelvis, possible lymphoma. Initial encounter. EXAM: CT CHEST WITHOUT CONTRAST TECHNIQUE: Multidetector CT imaging of the chest was performed following the standard protocol without IV contrast. COMPARISON:  CT Abdomen and Pelvis 08/18/2015. Chest and abdominal radiographs 08/18/2015. FINDINGS: Noncontrast exam. Stable abnormal inferior posterior mediastinum and retrocrural soft tissue as described recently. No pericardial effusion. No pleural effusion. Mediastinal and axillary lymph nodes are normal. Thoracic inlet lymph nodes are normal. Calcified aortic and coronary artery atherosclerosis. Major  airways are patent. There are occasional calcified granulomas. There is right upper lobe bronchiectasis with mild patchy peribronchial opacity along the major fissure (series 305, image 16). Mild tree-in-bud nodular opacity along the inferior lateral right upper lobe near the minor fissure (image 24). Minor dependent atelectasis. Degenerative changes in the thoracic spine. No acute or suspicious osseous lesion in the chest. IMPRESSION: 1. Abnormal posterior mediastinal/retrocrural soft tissue as seen on the recent CT Abdomen and Pelvis. Mediastinal, axillary, and thoracic inlet lymph nodes are normal. 2. Right upper lobe bronchiectasis and peripheral / peribronchial opacity compatible with distal airway infection or less likely scarring. Electronically Signed   By: Genevie Ann M.D.   On: 08/19/2015 23:39   [name] DH Contrast  08/19/2015  CLINICAL DATA:  Headache.  Possible lymphoma. EXAM: MRI HEAD WITHOUT AND WITH CONTRAST TECHNIQUE: Multiplanar, multiecho pulse sequences of the brain and surrounding structures were obtained without and with intravenous contrast. CONTRAST:  79mL MULTIHANCE GADOBENATE DIMEGLUMINE 529 MG/ML IV SOLN COMPARISON:  None. FINDINGS:  Ventricle size normal. Cerebral volume normal. Pituitary normal in size. Craniocervical junction normal. Negative for acute infarct. Minimal hyperintensity in the periventricular white matter may represent chronic ischemia. Negative for hemorrhage or fluid collection. Negative for edema in the brain Postcontrast imaging reveals leptomeningeal enhancement diffusely and bilaterally. There is mild smooth enhancement of the meninges without nodularity. No enhancing mass lesion in the brain. IMPRESSION: Diffuse leptomeningeal enhancement. Given the history, this is concerning for lymphomatous involvement. Lumbar puncture with cytology recommended. Negative for acute infarct or mass lesion. Electronically Signed   By: Franchot Gallo M.D.   On: 08/19/2015 20:47    Nm Pet Image Initial (pi) Skull Base To Thigh  08/27/2015  CLINICAL DATA:  Initial treatment strategy for lymphoma. EXAM: NUCLEAR MEDICINE PET VERTEX TO THIGH TECHNIQUE: 8.75 mCi F-18 FDG was injected intravenously. Full-ring PET imaging was performed from the vertex to thigh after the radiotracer. CT data was obtained and used for attenuation correction and anatomic localization. FASTING BLOOD GLUCOSE:  Value: 126 mg/dl COMPARISON:  08/19/2015 FINDINGS: HEAD AND NECK No abnormal radiotracer accumulation identified within the brain. A small amount of gas is noted within the anterior horn of the left lateral ventricle. No hypermetabolic lymph nodes in the neck. CHEST Hypermetabolic posterior mediastinal soft tissue at the level of the thigh for ppm attic hiatus is identified. This measures 2 x 2.5 cm and has an SUV max equal to 6.9. No enlarged or hypermetabolic mediastinal or hilar lymph nodes. No hypermetabolic axillary or supraclavicular adenopathy. No suspicious pulmonary nodules identified on the CT images. Calcified granuloma is identified within the posterior left upper lobe. ABDOMEN/PELVIS No abnormal hypermetabolic activity within the liver, pancreas, adrenal glands, or spleen. Prominent retroperitoneal lymph nodes are identified. Index pre caval node just above the aortic bifurcation measures 1.3 cm and has an SUV max equal to 3.50. Increased presacral soft tissue is identified which exhibits mild increased uptake. This measures 1.x 5.1 x 4.0 cm. The SUV max within this area is equal to 4.13. SKELETON Diffuse heterogeneous tracer activity is identified throughout the bone marrow of the axial and proximal appendicular skeleton. More focal areas of intense uptake are noted within the thoracic spine, lumbar spine and pelvis. For example cuff within the L3 vertebra there is an area of increased uptake which has an SUV max equal to 6.38. IMPRESSION: 1. Abnormal soft tissue within the posterior mediastinum  exhibits malignant range FDG uptake and is compatible with clinical history of lymphoma. 2. There are a few enlarged retroperitoneal lymph nodes within the lower abdomen which exhibits nonspecific FDG uptake. Additionally, there is mild increased presacral soft tissue exhibiting low level FDG uptake. 3. Diffuse heterogeneous uptake throughout the bone marrow is noted and is worrisome for lymphomatous involvement. Electronically Signed   By: Kerby Moors M.D.   On: 08/27/2015 11:50   Ir Fluoro Guide Cv Line Right  08/26/2015  CLINICAL DATA:  ACCESS FOR CHEMOTHERAPY, BURKITT'S LYMPHOMA EXAM: RIGHT INTERNAL JUGULAR SINGLE LUMEN POWER PORT CATHETER INSERTION Date:  10/17/201610/17/2016 11:08 am Radiologist:  M. Daryll Brod, MD Guidance:  Ultrasound and fluoroscopic FLUOROSCOPY TIME:  30 seconds, 3 mGy MEDICATIONS AND MEDICAL HISTORY: 2 g Ancefadministered within 1 hour of the procedure.3 mg Versed, 50 mcg fentanyl ANESTHESIA/SEDATION: 30 minutes CONTRAST:  None. COMPLICATIONS: None immediate PROCEDURE: Informed consent was obtained from the patient following explanation of the procedure, risks, benefits and alternatives. The patient understands, agrees and consents for the procedure. All questions were addressed. A time out was performed. Maximal  barrier sterile technique utilized including caps, mask, sterile gowns, sterile gloves, large sterile drape, hand hygiene, and 2% chlorhexidine scrub. Under sterile conditions and local anesthesia, right internal jugular micropuncture venous access was performed. Access was performed with ultrasound. Images were obtained for documentation. A guide wire was inserted followed by a transitional dilator. This allowed insertion of a guide wire and catheter into the IVC. Measurements were obtained from the SVC / RA junction back to the right IJ venotomy site. In the right infraclavicular chest, a subcutaneous pocket was created over the second anterior rib. This was done under  sterile conditions and local anesthesia. 1% lidocaine with epinephrine was utilized for this. A 2.5 cm incision was made in the skin. Blunt dissection was performed to create a subcutaneous pocket over the right pectoralis major muscle. The pocket was flushed with saline vigorously. There was adequate hemostasis. The port catheter was assembled and checked for leakage. The port catheter was secured in the pocket with two retention sutures. The tubing was tunneled subcutaneously to the right venotomy site and inserted into the SVC/RA junction through a valved peel-away sheath. Position was confirmed with fluoroscopy. Images were obtained for documentation. The patient tolerated the procedure well. No immediate complications. Incisions were closed in a two layer fashion with 4 - 0 Vicryl suture. Dermabond was applied to the skin. The port catheter was accessed, blood was aspirated followed by saline and heparin flushes. Needle was removed. A dry sterile dressing was applied. IMPRESSION: Ultrasound and fluoroscopically guided right internal jugular single lumen power port catheter insertion. Tip in the SVC/RA junction. Catheter ready for use. Electronically Signed   By: Jerilynn Mages.  Shick M.D.   On: 08/26/2015 11:44   Ir US Guide Vasc Access Right  08/26/2015  CLINICAL DATA:  ACCESS FOR CHEMOTHERAPY, BURKITT'S LYMPHOMA EXAM: RIGHT INTERNAL JUGULAR SINGLE LUMEN POWER PORT CATHETER INSERTION Date:  10/17/201610/17/2016 11:08 am Radiologist:  M. Daryll Brod, MD Guidance:  Ultrasound and fluoroscopic FLUOROSCOPY TIME:  30 seconds, 3 mGy MEDICATIONS AND MEDICAL HISTORY: 2 g Ancefadministered within 1 hour of the procedure.3 mg Versed, 50 mcg fentanyl ANESTHESIA/SEDATION: 30 minutes CONTRAST:  None. COMPLICATIONS: None immediate PROCEDURE: Informed consent was obtained from the patient following explanation of the procedure, risks, benefits and alternatives. The patient understands, agrees and consents for the procedure. All  questions were addressed. A time out was performed. Maximal barrier sterile technique utilized including caps, mask, sterile gowns, sterile gloves, large sterile drape, hand hygiene, and 2% chlorhexidine scrub. Under sterile conditions and local anesthesia, right internal jugular micropuncture venous access was performed. Access was performed with ultrasound. Images were obtained for documentation. A guide wire was inserted followed by a transitional dilator. This allowed insertion of a guide wire and catheter into the IVC. Measurements were obtained from the SVC / RA junction back to the right IJ venotomy site. In the right infraclavicular chest, a subcutaneous pocket was created over the second anterior rib. This was done under sterile conditions and local anesthesia. 1% lidocaine with epinephrine was utilized for this. A 2.5 cm incision was made in the skin. Blunt dissection was performed to create a subcutaneous pocket over the right pectoralis major muscle. The pocket was flushed with saline vigorously. There was adequate hemostasis. The port catheter was assembled and checked for leakage. The port catheter was secured in the pocket with two retention sutures. The tubing was tunneled subcutaneously to the right venotomy site and inserted into the SVC/RA junction through a valved peel-away sheath. Position was  confirmed with fluoroscopy. Images were obtained for documentation. The patient tolerated the procedure well. No immediate complications. Incisions were closed in a two layer fashion with 4 - 0 Vicryl suture. Dermabond was applied to the skin. The port catheter was accessed, blood was aspirated followed by saline and heparin flushes. Needle was removed. A dry sterile dressing was applied. IMPRESSION: Ultrasound and fluoroscopically guided right internal jugular single lumen power port catheter insertion. Tip in the SVC/RA junction. Catheter ready for use. Electronically Signed   By: Jerilynn Mages.  Shick M.D.   On:  08/26/2015 11:44   Ct Biopsy  08/21/2015  CLINICAL DATA:  Suspected lymphoma EXAM: CT-GUIDED BIOPSY BONE MARROW AND PARA-AORTIC LYMPH NODE. MEDICATIONS AND MEDICAL HISTORY: Versed 2 mg, Fentanyl 100 mcg. Additional Medications: None. ANESTHESIA/SEDATION: Moderate sedation time: Third minutes PROCEDURE: The procedure, risks, benefits, and alternatives were explained to the patient. Questions regarding the procedure were encouraged and answered. The patient understands and consents to the procedure. The back was prepped with Betadine in a sterile fashion, and a sterile drape was applied covering the operative field. A sterile gown and sterile gloves were used for the procedure. Under CT guidance, an 11 gauge needle was inserted into the right iliac bone via posterior approach. Aspirates and a core were obtained Under CT guidance, an 17 gauge needle was inserted into the para caval lymph node via right posterior lateral approach. Four 18 gauge core biopsies were obtained. The guide needle was removed. Patient tolerated the procedure well without complication. Vital sign monitoring by nursing staff during the procedure will continue as patient is in the special procedures unit for post procedure observation. FINDINGS: The images document guide needle placement within the right iliac bone and pericaval lymph node. Post biopsy images demonstrate no hemorrhage. COMPLICATIONS: None IMPRESSION: Successful CT-guided bone marrow aspirate, bone marrow core, and pericaval lymph node core which was placed in saline for lymphoma flow analysis. Electronically Signed   By: Marybelle Killings M.D.   On: 08/21/2015 14:10   Ct Biopsy  08/21/2015  CLINICAL DATA:  Suspected lymphoma EXAM: CT-GUIDED BIOPSY BONE MARROW AND PARA-AORTIC LYMPH NODE. MEDICATIONS AND MEDICAL HISTORY: Versed 2 mg, Fentanyl 100 mcg. Additional Medications: None. ANESTHESIA/SEDATION: Moderate sedation time: Third minutes PROCEDURE: The procedure, risks,  benefits, and alternatives were explained to the patient. Questions regarding the procedure were encouraged and answered. The patient understands and consents to the procedure. The back was prepped with Betadine in a sterile fashion, and a sterile drape was applied covering the operative field. A sterile gown and sterile gloves were used for the procedure. Under CT guidance, an 11 gauge needle was inserted into the right iliac bone via posterior approach. Aspirates and a core were obtained Under CT guidance, an 17 gauge needle was inserted into the para caval lymph node via right posterior lateral approach. Four 18 gauge core biopsies were obtained. The guide needle was removed. Patient tolerated the procedure well without complication. Vital sign monitoring by nursing staff during the procedure will continue as patient is in the special procedures unit for post procedure observation. FINDINGS: The images document guide needle placement within the right iliac bone and pericaval lymph node. Post biopsy images demonstrate no hemorrhage. COMPLICATIONS: None IMPRESSION: Successful CT-guided bone marrow aspirate, bone marrow core, and pericaval lymph node core which was placed in saline for lymphoma flow analysis. Electronically Signed   By: Marybelle Killings M.D.   On: 08/21/2015 14:10   Dg Fluoro Guide Lumbar Puncture  09/16/2015  CLINICAL  DATA:  Lymphoma, methotrexate injection. EXAM: FLUOROSCOPICALLY GUIDED LUMBAR PUNCTURE FOR INTRATHECAL CHEMOTHERAPY TECHNIQUE: Informed consent was obtained from the patient prior to the procedure, including potential complications of headache, allergy, and pain. A 'time out' was performed. With the patient prone, the lower back was prepped with Betadine. 1% Lidocaine was used for local anesthesia. Lumbar puncture was performed at the L5-S1 level using a 20 gauge needle with return of clear CSF. 12 mg Of methotrexate was injected into the subarachnoid space. The patient tolerated the  procedure well without apparent complication. FLUOROSCOPY TIME:  Fluoroscopy Time (in minutes and seconds): 0 minutes 40 seconds. Number of Acquired Images:  None. IMPRESSION: 1. Intrathecal injection of chemotherapy without complication. 2. Due to extremely slow flow, less than 1 cc CSF was collected for laboratory evaluation. Electronically Signed   By: Lorin Picket M.D.   On: 09/16/2015 16:18   Dg Fluoro Guide Lumbar Puncture  09/09/2015  CLINICAL DATA:  Burkitt's lymphoma with leptomeningeal involvement. Third dose intrathecal chemotherapy EXAM: FLUOROSCOPICALLY GUIDED LUMBAR PUNCTURE FOR INTRATHECAL CHEMOTHERAPY TECHNIQUE: Informed consent was obtained from the patient prior to the procedure, including potential complications of headache, allergy, and pain. A 'time out' was performed. With the patient prone, the lower back was prepped with Betadine. 1% Lidocaine was used for local anesthesia. Lumbar puncture was performed at the L5-S1 using a 22 gauge needle with return of clear CSF. 12 mg methotrexate and 50 mg hydrocortisone pharmacy prepared was injected into the subarachnoid space. The patient tolerated the procedure well without apparent complication. FLUOROSCOPY TIME:  1 minutes 11 seconds IMPRESSION: Intrathecal injection of chemotherapy without complication Electronically Signed   By: Suzy Bouchard M.D.   On: 09/09/2015 11:08   Dg Fluoro Guide Lumbar Puncture  08/30/2015  CLINICAL DATA:  History of Burkitt's lymphoma. Diagnostic lumbar puncture with intrathecal chemotherapy injection. Subsequent encounter. EXAM: DIAGNOSTIC LUMBAR PUNCTURE UNDER FLUOROSCOPIC GUIDANCE FLUOROSCOPY TIME:  Radiation Exposure Index (as provided by the fluoroscopic device): If the device does not provide the exposure index: Fluoroscopy Time (in minutes and seconds):  44 seconds Number of Acquired Images:  0. PROCEDURE: Informed consent was obtained from the patient prior to the procedure, including potential  complications of headache, allergy, and pain. With the patient prone, the lower back was prepped with Betadine. 1% Lidocaine was used for local anesthesia. Lumbar puncture was performed at the L5-S1 level using a 20 gauge needle with return of initially blood tinged but clearing. Fourteen ml of CSF were obtained for laboratory studies. Laboratory prepared methotrexate was injected into the subarachnoid space without difficulty. The patient tolerated the procedure well and there were no apparent complications. IMPRESSION: Diagnostic lumbar puncture and intrathecal injection of methotrexate without complication. Electronically Signed   By: Inge Rise M.D.   On: 08/30/2015 15:44   Dg Fluoro Guide Lumbar Puncture  08/26/2015  CLINICAL DATA:  Diagnostic lumbar puncture with intrathecal chemotherapy injection EXAM: FLUOROSCOPICALLY GUIDED LUMBAR PUNCTURE FOR INTRATHECAL CHEMOTHERAPY FLUOROSCOPY TIME:  dictate in minutes and seconds Radiation Exposure Index (as provided by the fluoroscopic device): Not available on this device If the device does not provide the exposure index: Fluoroscopy Time (in minutes and seconds):  1 minutes 32 seconds Number of Acquired Images:  1 PROCEDURE: Informed consent was obtained from the patient prior to the procedure, including potential complications of headache, allergy, and pain. With the patient prone, the lower back was prepped with Betadine. 1% Lidocaine was used for local anesthesia. Lumbar puncture was performed at the L3-4  level without success, likely due to ligamentous ossification. After numbing, attempt was then made at the L2-3 level, with dry tap despite good needle placement. After numbing, needle placed at the L5-S1 level with easy access to somewhat low pressure CSF (patient NPO for port placement this am). A 20 gauge needle was used. 11 cc of clear CSF were obtained for laboratory studies. The entire syringe of laboratory prepared methotrexate was then injected  into the subarachnoid space. The patient tolerated the procedure well without apparent complication. IMPRESSION: Diagnostic lumbar puncture and intrathecal injection of chemotherapy without complication. Electronically Signed   By: Monte Fantasia M.D.   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 Her CSF cytology was negative though her MRI showed diffuse leptomeningeal enhancement. Patient is status post first cycle of dose adjusted EPOCH-R and has received 3 weekly doses of intrathecal methotrexate plus hydrocortisone. Her EPOCH-R was dose reduced where day 4 of infusional doxorubicin/etoposide/vincristine was held and cyclophosphamide dose was also reduced. 2] protein calorie malnutrition -albumin level seemed to be improving. 3] hypogammaglobinemia due to [place] -patient tolerating chemotherapy well thus far with no acute new symptoms. -labs were hemodiluted this AM due to lab draw technique..stable on recheck. -conitnue with daEPOCH-R as per plan -received intrathecal methotrexate with hydrocortisone on day 1 --without any concerns.  Plan for IT MTX on day 5 -Daily CBC, CMP, uric acid  -Will need outpatient Neulasta day 7. -Will come back for her sixth dose of intrathecal methotrexate on day 14 cycle 2. -We will get a repeat PET CT scan and MRI of the brain prior to cycle 3  -After 6 weekly doses of intrathecal methotrexate will likely switch to every 2 weeks intrathecal methotrexate v/s monthly maintenance depending on MRI of the brain and patient's preference . -Will continue follow daily    I spent 25 minutes counseling the patient face to face. The total time spent in the appointment was 25 minutes and more than 50% was on counseling and direct patient cares.    Sullivan Lone MD [place] AAHIVMS [facility] Hematology/Oncology Physician [facility]  (Office):        [phone] (Work cell):  [phone] (Fax):           [phone]  09/17/2015

## 2015-09-17 NOTE — Progress Notes (Signed)
Manual calculation of BSA and dosing for doxorubicin, etoposide, and vincristine completed. Secondary verification by Laural Benes, RN

## 2015-09-17 NOTE — Progress Notes (Signed)
[facility]  Patient Status: Active (receiving services up to time of hospitalization)  [facility] is providing the following services: RN, PT and HHA  If patient discharges after hours, please call [phone].   Kristen Baker 09/17/2015, 8:38 AM

## 2015-09-18 ENCOUNTER — Telehealth: Payer: Self-pay | Admitting: *Deleted

## 2015-09-18 ENCOUNTER — Telehealth: Payer: Self-pay | Admitting: Hematology

## 2015-09-18 LAB — CBC WITH DIFFERENTIAL/PLATELET
BASOS ABS: 0 10*3/uL (ref 0.0–0.1)
BASOS PCT: 0 %
EOS PCT: 0 %
Eosinophils Absolute: 0 10*3/uL (ref 0.0–0.7)
HEMATOCRIT: 21.6 % — AB (ref 36.0–46.0)
Hemoglobin: 7.1 g/dL — ABNORMAL LOW (ref 12.0–15.0)
Lymphocytes Relative: 27 %
Lymphs Abs: 0.7 10*3/uL (ref 0.7–4.0)
MCH: 30.7 pg (ref 26.0–34.0)
MCHC: 32.9 g/dL (ref 30.0–36.0)
MCV: 93.5 fL (ref 78.0–100.0)
MONO ABS: 0.3 10*3/uL (ref 0.1–1.0)
Monocytes Relative: 10 %
NEUTROS ABS: 1.7 10*3/uL (ref 1.7–7.7)
Neutrophils Relative %: 63 %
Platelets: 233 10*3/uL (ref 150–400)
RBC: 2.31 MIL/uL — ABNORMAL LOW (ref 3.87–5.11)
RDW: 15.2 % (ref 11.5–15.5)
WBC: 2.7 10*3/uL — ABNORMAL LOW (ref 4.0–10.5)

## 2015-09-18 LAB — COMPREHENSIVE METABOLIC PANEL
ALK PHOS: 66 U/L (ref 38–126)
ALT: 16 U/L (ref 14–54)
ANION GAP: 4 — AB (ref 5–15)
AST: 13 U/L — ABNORMAL LOW (ref 15–41)
Albumin: 2.4 g/dL — ABNORMAL LOW (ref 3.5–5.0)
BILIRUBIN TOTAL: 0.5 mg/dL (ref 0.3–1.2)
BUN: 12 mg/dL (ref 6–20)
CALCIUM: 8.1 mg/dL — AB (ref 8.9–10.3)
CO2: 30 mmol/L (ref 22–32)
Chloride: 104 mmol/L (ref 101–111)
Creatinine, Ser: 0.57 mg/dL (ref 0.44–1.00)
Glucose, Bld: 106 mg/dL — ABNORMAL HIGH (ref 65–99)
POTASSIUM: 3.9 mmol/L (ref 3.5–5.1)
Sodium: 138 mmol/L (ref 135–145)
TOTAL PROTEIN: 4.6 g/dL — AB (ref 6.5–8.1)

## 2015-09-18 LAB — URIC ACID: URIC ACID, SERUM: 3.9 mg/dL (ref 2.3–6.6)

## 2015-09-18 LAB — HEMOGLOBIN AND HEMATOCRIT, BLOOD
HEMATOCRIT: 25.2 % — AB (ref 36.0–46.0)
Hemoglobin: 8.2 g/dL — ABNORMAL LOW (ref 12.0–15.0)

## 2015-09-18 LAB — PREPARE RBC (CROSSMATCH)

## 2015-09-18 MED ORDER — SODIUM CHLORIDE 0.9 % IV SOLN
250.0000 mL | Freq: Once | INTRAVENOUS | Status: AC
  Administered 2015-09-18: 250 mL via INTRAVENOUS

## 2015-09-18 MED ORDER — SODIUM CHLORIDE 0.9 % IJ SOLN: 10.0000 mL | INTRAMUSCULAR | Status: DC | PRN

## 2015-09-18 MED ORDER — HEPARIN SOD (PORK) LOCK FLUSH 100 UNIT/ML IV SOLN: 250.0000 [IU] | INTRAVENOUS | Status: DC | PRN

## 2015-09-18 MED ORDER — VINCRISTINE SULFATE CHEMO INJECTION 1 MG/ML
Freq: Once | INTRAVENOUS | Status: AC
  Administered 2015-09-18: 15:00:00 via INTRAVENOUS
  Filled 2015-09-18: qty 10

## 2015-09-18 MED ORDER — DEXAMETHASONE SODIUM PHOSPHATE 4 MG/ML IJ SOLN
8.0000 mg | Freq: Once | INTRAMUSCULAR | Status: AC
  Administered 2015-09-18: 8 mg via INTRAVENOUS
  Filled 2015-09-18: qty 2

## 2015-09-18 MED ORDER — SODIUM CHLORIDE 0.9 % IJ SOLN: 3.0000 mL | INTRAMUSCULAR | Status: DC | PRN

## 2015-09-18 MED ORDER — HEPARIN SOD (PORK) LOCK FLUSH 100 UNIT/ML IV SOLN: 500.0000 [IU] | Freq: Every day | INTRAVENOUS | Status: DC | PRN

## 2015-09-18 MED ORDER — PALONOSETRON HCL INJECTION 0.25 MG/5ML
0.2500 mg | Freq: Once | INTRAVENOUS | Status: AC
  Administered 2015-09-18: 0.25 mg via INTRAVENOUS
  Filled 2015-09-18: qty 5

## 2015-09-18 NOTE — Progress Notes (Signed)
[name]   HEMATOLOGY/ONCOLOGY INPATIENT PROGRESS NOTE  Date of Service: 09/18/2015  Inpatient Attending: .Brunetta Genera, MD   SUBJECTIVE  Kristen Baker was seen this afternooon. She notes no acute concerns. Notes that her constipation has resolved. Eating better. Has not been ambulating. Placed order to ambulate the patient with supervision q3-4 hours while awake and discussed with her the importance of using SCD while in bed since we are not doing lovenox as a result of her intrathecal therapies.  OBJECTIVE:   PHYSICAL EXAMINATION: . Filed Vitals:   09/17/15 0604 09/17/15 1255 09/17/15 2050 09/18/15 0537  BP: 118/48 116/55 111/52 104/57  Pulse:  66 66 63  Temp: 97.8 F (36.6 C) 98.3 F (36.8 C) 98.1 F (36.7 C) 97.9 F (36.6 C)  TempSrc: Oral Oral Oral Oral  Resp: 16 16 16 16   Height: 5\' 6"  (1.676 m)     Weight: 164 lb 8 oz (74.617 kg)     SpO2: 98% 98% 97% 96%   Filed Weights   09/16/15 1059 09/17/15 0604  Weight: 166 lb 4.8 oz (75.433 kg) 164 lb 8 oz (74.617 kg)   .Body mass index is 26.56 kg/(m^2).  GENERAL: Elderly lady in no acute distress SKIN: skin color, texture, turgor are normal, no rashes or significant lesions EYES: normal, conjunctiva are pink and non-injected, sclera clear, baseline strabismus. OROPHARYNX:no exudate, no erythema and lips, buccal mucosa, and tongue normal  NECK: supple, no JVD, thyroid normal size, non-tender, without nodularity LYMPH: no palpable lymphadenopathy in the cervical, axillary or inguinal LUNGS: clear to auscultation with normal respiratory effort HEART: regular rate & rhythm, no murmurs and no lower extremity edema ABDOMEN: abdomen soft, non-tender, normoactive bowel sounds  Musculoskeletal: no cyanosis of digits and no clubbing  PSYCH: alert & oriented x 3 with fluent speech NEURO: no focal motor/sensory deficits  MEDICAL HISTORY:  Past Medical History  Diagnosis Date  . Cancer (HCC)     cervical cancer  . Burkitt's  lymphoma ([place]) 08/29/2015    SURGICAL HISTORY: Past Surgical History  Procedure Laterality Date  . Cholecystectomy    . Tonsillectomy    . Abdominal hysterectomy    . Abdominal surgery      2-3 rd of colon removed    SOCIAL HISTORY: Social History   Social History  . Marital Status: Widowed    Spouse Name: N/A  . Number of Children: N/A  . Years of Education: N/A   Occupational History  . Not on file.   Social History Main Topics  . Smoking status: Never Smoker   . Smokeless tobacco: Never Used  . Alcohol Use: No  . Drug Use: No  . Sexual Activity: No   Other Topics Concern  . Not on file   Social History Narrative    FAMILY HISTORY: History reviewed. No pertinent family history.  ALLERGIES:  is allergic to cabbage; codeine; onion; shellfish allergy; sulfa antibiotics; and zofran.  MEDICATIONS:  Scheduled Meds: . dexamethasone  8 mg Intravenous Once  . DOXOrubicin/vinCRIStine/etoposide CHEMO IV infusion for Inpatient CI   Intravenous Once  . DOXOrubicin/vinCRIStine/etoposide CHEMO IV infusion for Inpatient CI   Intravenous Once  . feeding supplement (ENSURE ENLIVE)  237 mL Oral BID BM  . methotrexate INTRATHECAL (+/- HYDROCORTISONE,Ara-C)   Intrathecal Once  . palonosetron  0.25 mg Intravenous Once  . pantoprazole  40 mg Oral Daily  . polyethylene glycol  17 g Oral Daily  . predniSONE  100 mg Oral QAC breakfast  . senna  2 tablet Oral QHS  . [START ON 09/23/2015] Vitamin D (Ergocalciferol)  50,000 Units Oral Q7 days   Continuous Infusions: . sodium chloride 20 mL/hr at 09/16/15 1600   PRN Meds:.acetaminophen, alteplase, alum & mag hydroxide-simeth, Cold Pack, heparin lock flush, heparin lock flush, Hot Pack, lidocaine-prilocaine, prochlorperazine, senna-docusate, sodium chloride, sodium chloride  REVIEW OF SYSTEMS:    10 Point review of Systems was done is negative except as noted above.   LABORATORY DATA:  I have reviewed the data as  listed  . CBC Latest Ref Rng 09/18/2015 09/17/2015 09/17/2015  WBC 4.0 - 10.5 K/uL 2.7(L) 3.8(L) 3.2(L)  Hemoglobin 12.0 - 15.0 g/dL 7.1(L) 8.5(L) 7.2(L)  Hematocrit 36.0 - 46.0 % 21.6(L) 26.1(L) 22.0(L)  Platelets 150 - 400 K/uL 233 245 189    . CMP Latest Ref Rng 09/18/2015 09/17/2015 09/17/2015  Glucose 65 - 99 mg/dL 106(H) 182(H) 148(H)  BUN 6 - 20 mg/dL 12 11 9   Creatinine 0.44 - 1.00 mg/dL 0.57 0.72 0.60  Sodium 135 - 145 mmol/L 138 137 136  Potassium 3.5 - 5.1 mmol/L 3.9 3.7 4.2  Chloride 101 - 111 mmol/L 104 102 102  CO2 22 - 32 mmol/L 30 27 27   Calcium 8.9 - 10.3 mg/dL 8.1(L) 8.4(L) 8.2(L)  Total Protein 6.5 - 8.1 g/dL 4.6(L) 5.4(L) 4.7(L)  Total Bilirubin 0.3 - 1.2 mg/dL 0.5 0.6 0.5  Alkaline Phos 38 - 126 U/L 66 85 72  AST 15 - 41 U/L 13(L) 15 8(L)  ALT 14 - 54 U/L 16 18 14      RADIOGRAPHIC STUDIES: I have personally reviewed the radiological images as listed and agreed with the findings in the report. Dg Chest 2 View  09/06/2015  CLINICAL DATA:  Onset of weakness last night, generalized weakness throughout body, recently diagnosed with cervical cancer with first chemotherapy treatment last week, neutropenia, a intermittent midline lower abdominal pain EXAM: CHEST  2 VIEW COMPARISON:  08/18/2015 FINDINGS: RIGHT jugular Port-A-Cath with tip projecting over SVC. Normal heart size, mediastinal contours and pulmonary vascularity. Atherosclerotic calcification aorta. Lungs clear. No pleural effusion or pneumothorax. Bones demineralized. IMPRESSION: No acute abnormalities. Electronically Signed   By: Lavonia Dana M.D.   On: 09/06/2015 14:17   Ct Chest Wo Contrast  08/19/2015  CLINICAL DATA:  81 year old female with abnormal CT Abdomen and Pelvis, possible lymphoma. Initial encounter. EXAM: CT CHEST WITHOUT CONTRAST TECHNIQUE: Multidetector CT imaging of the chest was performed following the standard protocol without IV contrast. COMPARISON:  CT Abdomen and Pelvis 08/18/2015. Chest  and abdominal radiographs 08/18/2015. FINDINGS: Noncontrast exam. Stable abnormal inferior posterior mediastinum and retrocrural soft tissue as described recently. No pericardial effusion. No pleural effusion. Mediastinal and axillary lymph nodes are normal. Thoracic inlet lymph nodes are normal. Calcified aortic and coronary artery atherosclerosis. Major airways are patent. There are occasional calcified granulomas. There is right upper lobe bronchiectasis with mild patchy peribronchial opacity along the major fissure (series 305, image 16). Mild tree-in-bud nodular opacity along the inferior lateral right upper lobe near the minor fissure (image 24). Minor dependent atelectasis. Degenerative changes in the thoracic spine. No acute or suspicious osseous lesion in the chest. IMPRESSION: 1. Abnormal posterior mediastinal/retrocrural soft tissue as seen on the recent CT Abdomen and Pelvis. Mediastinal, axillary, and thoracic inlet lymph nodes are normal. 2. Right upper lobe bronchiectasis and peripheral / peribronchial opacity compatible with distal airway infection or less likely scarring. Electronically Signed   By: Genevie Ann M.D.   On: 08/19/2015 23:39  [name] Wo Contrast  08/19/2015  CLINICAL DATA:  Headache.  Possible lymphoma. EXAM: MRI HEAD WITHOUT AND WITH CONTRAST TECHNIQUE: Multiplanar, multiecho pulse sequences of the brain and surrounding structures were obtained without and with intravenous contrast. CONTRAST:  73mL MULTIHANCE GADOBENATE DIMEGLUMINE 529 MG/ML IV SOLN COMPARISON:  None. FINDINGS: Ventricle size normal. Cerebral volume normal. Pituitary normal in size. Craniocervical junction normal. Negative for acute infarct. Minimal hyperintensity in the periventricular white matter may represent chronic ischemia. Negative for hemorrhage or fluid collection. Negative for edema in the brain Postcontrast imaging reveals leptomeningeal enhancement diffusely and bilaterally. There is mild smooth  enhancement of the meninges without nodularity. No enhancing mass lesion in the brain. IMPRESSION: Diffuse leptomeningeal enhancement. Given the history, this is concerning for lymphomatous involvement. Lumbar puncture with cytology recommended. Negative for acute infarct or mass lesion. Electronically Signed   By: Franchot Gallo M.D.   On: 08/19/2015 20:47   Nm Pet Image Initial (pi) Skull Base To Thigh  08/27/2015  CLINICAL DATA:  Initial treatment strategy for lymphoma. EXAM: NUCLEAR MEDICINE PET VERTEX TO THIGH TECHNIQUE: 8.75 mCi F-18 FDG was injected intravenously. Full-ring PET imaging was performed from the vertex to thigh after the radiotracer. CT data was obtained and used for attenuation correction and anatomic localization. FASTING BLOOD GLUCOSE:  Value: 126 mg/dl COMPARISON:  08/19/2015 FINDINGS: HEAD AND NECK No abnormal radiotracer accumulation identified within the brain. A small amount of gas is noted within the anterior horn of the left lateral ventricle. No hypermetabolic lymph nodes in the neck. CHEST Hypermetabolic posterior mediastinal soft tissue at the level of the thigh for ppm attic hiatus is identified. This measures 2 x 2.5 cm and has an SUV max equal to 6.9. No enlarged or hypermetabolic mediastinal or hilar lymph nodes. No hypermetabolic axillary or supraclavicular adenopathy. No suspicious pulmonary nodules identified on the CT images. Calcified granuloma is identified within the posterior left upper lobe. ABDOMEN/PELVIS No abnormal hypermetabolic activity within the liver, pancreas, adrenal glands, or spleen. Prominent retroperitoneal lymph nodes are identified. Index pre caval node just above the aortic bifurcation measures 1.3 cm and has an SUV max equal to 3.50. Increased presacral soft tissue is identified which exhibits mild increased uptake. This measures 1.x 5.1 x 4.0 cm. The SUV max within this area is equal to 4.13. SKELETON Diffuse heterogeneous tracer activity is  identified throughout the bone marrow of the axial and proximal appendicular skeleton. More focal areas of intense uptake are noted within the thoracic spine, lumbar spine and pelvis. For example cuff within the L3 vertebra there is an area of increased uptake which has an SUV max equal to 6.38. IMPRESSION: 1. Abnormal soft tissue within the posterior mediastinum exhibits malignant range FDG uptake and is compatible with clinical history of lymphoma. 2. There are a few enlarged retroperitoneal lymph nodes within the lower abdomen which exhibits nonspecific FDG uptake. Additionally, there is mild increased presacral soft tissue exhibiting low level FDG uptake. 3. Diffuse heterogeneous uptake throughout the bone marrow is noted and is worrisome for lymphomatous involvement. Electronically Signed   By: Kerby Moors M.D.   On: 08/27/2015 11:50   Ir Fluoro Guide Cv Line Right  08/26/2015  CLINICAL DATA:  ACCESS FOR CHEMOTHERAPY, BURKITT'S LYMPHOMA EXAM: RIGHT INTERNAL JUGULAR SINGLE LUMEN POWER PORT CATHETER INSERTION Date:  10/17/201610/17/2016 11:08 am Radiologist:  M. Daryll Brod, MD Guidance:  Ultrasound and fluoroscopic FLUOROSCOPY TIME:  30 seconds, 3 mGy MEDICATIONS AND MEDICAL HISTORY: 2 g Ancefadministered within 1  hour of the procedure.3 mg Versed, 50 mcg fentanyl ANESTHESIA/SEDATION: 30 minutes CONTRAST:  None. COMPLICATIONS: None immediate PROCEDURE: Informed consent was obtained from the patient following explanation of the procedure, risks, benefits and alternatives. The patient understands, agrees and consents for the procedure. All questions were addressed. A time out was performed. Maximal barrier sterile technique utilized including caps, mask, sterile gowns, sterile gloves, large sterile drape, hand hygiene, and 2% chlorhexidine scrub. Under sterile conditions and local anesthesia, right internal jugular micropuncture venous access was performed. Access was performed with ultrasound. Images were  obtained for documentation. A guide wire was inserted followed by a transitional dilator. This allowed insertion of a guide wire and catheter into the IVC. Measurements were obtained from the SVC / RA junction back to the right IJ venotomy site. In the right infraclavicular chest, a subcutaneous pocket was created over the second anterior rib. This was done under sterile conditions and local anesthesia. 1% lidocaine with epinephrine was utilized for this. A 2.5 cm incision was made in the skin. Blunt dissection was performed to create a subcutaneous pocket over the right pectoralis major muscle. The pocket was flushed with saline vigorously. There was adequate hemostasis. The port catheter was assembled and checked for leakage. The port catheter was secured in the pocket with two retention sutures. The tubing was tunneled subcutaneously to the right venotomy site and inserted into the SVC/RA junction through a valved peel-away sheath. Position was confirmed with fluoroscopy. Images were obtained for documentation. The patient tolerated the procedure well. No immediate complications. Incisions were closed in a two layer fashion with 4 - 0 Vicryl suture. Dermabond was applied to the skin. The port catheter was accessed, blood was aspirated followed by saline and heparin flushes. Needle was removed. A dry sterile dressing was applied. IMPRESSION: Ultrasound and fluoroscopically guided right internal jugular single lumen power port catheter insertion. Tip in the SVC/RA junction. Catheter ready for use. Electronically Signed   By: Jerilynn Mages.  Shick M.D.   On: 08/26/2015 11:44   Ir US Guide Vasc Access Right  08/26/2015  CLINICAL DATA:  ACCESS FOR CHEMOTHERAPY, BURKITT'S LYMPHOMA EXAM: RIGHT INTERNAL JUGULAR SINGLE LUMEN POWER PORT CATHETER INSERTION Date:  10/17/201610/17/2016 11:08 am Radiologist:  M. Daryll Brod, MD Guidance:  Ultrasound and fluoroscopic FLUOROSCOPY TIME:  30 seconds, 3 mGy MEDICATIONS AND MEDICAL  HISTORY: 2 g Ancefadministered within 1 hour of the procedure.3 mg Versed, 50 mcg fentanyl ANESTHESIA/SEDATION: 30 minutes CONTRAST:  None. COMPLICATIONS: None immediate PROCEDURE: Informed consent was obtained from the patient following explanation of the procedure, risks, benefits and alternatives. The patient understands, agrees and consents for the procedure. All questions were addressed. A time out was performed. Maximal barrier sterile technique utilized including caps, mask, sterile gowns, sterile gloves, large sterile drape, hand hygiene, and 2% chlorhexidine scrub. Under sterile conditions and local anesthesia, right internal jugular micropuncture venous access was performed. Access was performed with ultrasound. Images were obtained for documentation. A guide wire was inserted followed by a transitional dilator. This allowed insertion of a guide wire and catheter into the IVC. Measurements were obtained from the SVC / RA junction back to the right IJ venotomy site. In the right infraclavicular chest, a subcutaneous pocket was created over the second anterior rib. This was done under sterile conditions and local anesthesia. 1% lidocaine with epinephrine was utilized for this. A 2.5 cm incision was made in the skin. Blunt dissection was performed to create a subcutaneous pocket over the right pectoralis major muscle. The  pocket was flushed with saline vigorously. There was adequate hemostasis. The port catheter was assembled and checked for leakage. The port catheter was secured in the pocket with two retention sutures. The tubing was tunneled subcutaneously to the right venotomy site and inserted into the SVC/RA junction through a valved peel-away sheath. Position was confirmed with fluoroscopy. Images were obtained for documentation. The patient tolerated the procedure well. No immediate complications. Incisions were closed in a two layer fashion with 4 - 0 Vicryl suture. Dermabond was applied to the skin.  The port catheter was accessed, blood was aspirated followed by saline and heparin flushes. Needle was removed. A dry sterile dressing was applied. IMPRESSION: Ultrasound and fluoroscopically guided right internal jugular single lumen power port catheter insertion. Tip in the SVC/RA junction. Catheter ready for use. Electronically Signed   By: Jerilynn Mages.  Shick M.D.   On: 08/26/2015 11:44   Ct Biopsy  08/21/2015  CLINICAL DATA:  Suspected lymphoma EXAM: CT-GUIDED BIOPSY BONE MARROW AND PARA-AORTIC LYMPH NODE. MEDICATIONS AND MEDICAL HISTORY: Versed 2 mg, Fentanyl 100 mcg. Additional Medications: None. ANESTHESIA/SEDATION: Moderate sedation time: Third minutes PROCEDURE: The procedure, risks, benefits, and alternatives were explained to the patient. Questions regarding the procedure were encouraged and answered. The patient understands and consents to the procedure. The back was prepped with Betadine in a sterile fashion, and a sterile drape was applied covering the operative field. A sterile gown and sterile gloves were used for the procedure. Under CT guidance, an 11 gauge needle was inserted into the right iliac bone via posterior approach. Aspirates and a core were obtained Under CT guidance, an 17 gauge needle was inserted into the para caval lymph node via right posterior lateral approach. Four 18 gauge core biopsies were obtained. The guide needle was removed. Patient tolerated the procedure well without complication. Vital sign monitoring by nursing staff during the procedure will continue as patient is in the special procedures unit for post procedure observation. FINDINGS: The images document guide needle placement within the right iliac bone and pericaval lymph node. Post biopsy images demonstrate no hemorrhage. COMPLICATIONS: None IMPRESSION: Successful CT-guided bone marrow aspirate, bone marrow core, and pericaval lymph node core which was placed in saline for lymphoma flow analysis. Electronically Signed    By: Marybelle Killings M.D.   On: 08/21/2015 14:10   Ct Biopsy  08/21/2015  CLINICAL DATA:  Suspected lymphoma EXAM: CT-GUIDED BIOPSY BONE MARROW AND PARA-AORTIC LYMPH NODE. MEDICATIONS AND MEDICAL HISTORY: Versed 2 mg, Fentanyl 100 mcg. Additional Medications: None. ANESTHESIA/SEDATION: Moderate sedation time: Third minutes PROCEDURE: The procedure, risks, benefits, and alternatives were explained to the patient. Questions regarding the procedure were encouraged and answered. The patient understands and consents to the procedure. The back was prepped with Betadine in a sterile fashion, and a sterile drape was applied covering the operative field. A sterile gown and sterile gloves were used for the procedure. Under CT guidance, an 11 gauge needle was inserted into the right iliac bone via posterior approach. Aspirates and a core were obtained Under CT guidance, an 17 gauge needle was inserted into the para caval lymph node via right posterior lateral approach. Four 18 gauge core biopsies were obtained. The guide needle was removed. Patient tolerated the procedure well without complication. Vital sign monitoring by nursing staff during the procedure will continue as patient is in the special procedures unit for post procedure observation. FINDINGS: The images document guide needle placement within the right iliac bone and pericaval lymph node. Post biopsy  images demonstrate no hemorrhage. COMPLICATIONS: None IMPRESSION: Successful CT-guided bone marrow aspirate, bone marrow core, and pericaval lymph node core which was placed in saline for lymphoma flow analysis. Electronically Signed   By: Marybelle Killings M.D.   On: 08/21/2015 14:10   Dg Fluoro Guide Lumbar Puncture  09/16/2015  CLINICAL DATA:  Lymphoma, methotrexate injection. EXAM: FLUOROSCOPICALLY GUIDED LUMBAR PUNCTURE FOR INTRATHECAL CHEMOTHERAPY TECHNIQUE: Informed consent was obtained from the patient prior to the procedure, including potential complications of  headache, allergy, and pain. A 'time out' was performed. With the patient prone, the lower back was prepped with Betadine. 1% Lidocaine was used for local anesthesia. Lumbar puncture was performed at the L5-S1 level using a 20 gauge needle with return of clear CSF. 12 mg Of methotrexate was injected into the subarachnoid space. The patient tolerated the procedure well without apparent complication. FLUOROSCOPY TIME:  Fluoroscopy Time (in minutes and seconds): 0 minutes 40 seconds. Number of Acquired Images:  None. IMPRESSION: 1. Intrathecal injection of chemotherapy without complication. 2. Due to extremely slow flow, less than 1 cc CSF was collected for laboratory evaluation. Electronically Signed   By: Lorin Picket M.D.   On: 09/16/2015 16:18   Dg Fluoro Guide Lumbar Puncture  09/09/2015  CLINICAL DATA:  Burkitt's lymphoma with leptomeningeal involvement. Third dose intrathecal chemotherapy EXAM: FLUOROSCOPICALLY GUIDED LUMBAR PUNCTURE FOR INTRATHECAL CHEMOTHERAPY TECHNIQUE: Informed consent was obtained from the patient prior to the procedure, including potential complications of headache, allergy, and pain. A 'time out' was performed. With the patient prone, the lower back was prepped with Betadine. 1% Lidocaine was used for local anesthesia. Lumbar puncture was performed at the L5-S1 using a 22 gauge needle with return of clear CSF. 12 mg methotrexate and 50 mg hydrocortisone pharmacy prepared was injected into the subarachnoid space. The patient tolerated the procedure well without apparent complication. FLUOROSCOPY TIME:  1 minutes 11 seconds IMPRESSION: Intrathecal injection of chemotherapy without complication Electronically Signed   By: Suzy Bouchard M.D.   On: 09/09/2015 11:08   Dg Fluoro Guide Lumbar Puncture  08/30/2015  CLINICAL DATA:  History of Burkitt's lymphoma. Diagnostic lumbar puncture with intrathecal chemotherapy injection. Subsequent encounter. EXAM: DIAGNOSTIC LUMBAR PUNCTURE  UNDER FLUOROSCOPIC GUIDANCE FLUOROSCOPY TIME:  Radiation Exposure Index (as provided by the fluoroscopic device): If the device does not provide the exposure index: Fluoroscopy Time (in minutes and seconds):  44 seconds Number of Acquired Images:  0. PROCEDURE: Informed consent was obtained from the patient prior to the procedure, including potential complications of headache, allergy, and pain. With the patient prone, the lower back was prepped with Betadine. 1% Lidocaine was used for local anesthesia. Lumbar puncture was performed at the L5-S1 level using a 20 gauge needle with return of initially blood tinged but clearing. Fourteen ml of CSF were obtained for laboratory studies. Laboratory prepared methotrexate was injected into the subarachnoid space without difficulty. The patient tolerated the procedure well and there were no apparent complications. IMPRESSION: Diagnostic lumbar puncture and intrathecal injection of methotrexate without complication. Electronically Signed   By: Inge Rise M.D.   On: 08/30/2015 15:44   Dg Fluoro Guide Lumbar Puncture  08/26/2015  CLINICAL DATA:  Diagnostic lumbar puncture with intrathecal chemotherapy injection EXAM: FLUOROSCOPICALLY GUIDED LUMBAR PUNCTURE FOR INTRATHECAL CHEMOTHERAPY FLUOROSCOPY TIME:  dictate in minutes and seconds Radiation Exposure Index (as provided by the fluoroscopic device): Not available on this device If the device does not provide the exposure index: Fluoroscopy Time (in minutes and seconds):  1  minutes 32 seconds Number of Acquired Images:  1 PROCEDURE: Informed consent was obtained from the patient prior to the procedure, including potential complications of headache, allergy, and pain. With the patient prone, the lower back was prepped with Betadine. 1% Lidocaine was used for local anesthesia. Lumbar puncture was performed at the L3-4 level without success, likely due to ligamentous ossification. After numbing, attempt was then made at  the L2-3 level, with dry tap despite good needle placement. After numbing, needle placed at the L5-S1 level with easy access to somewhat low pressure CSF (patient NPO for port placement this am). A 20 gauge needle was used. 11 cc of clear CSF were obtained for laboratory studies. The entire syringe of laboratory prepared methotrexate was then injected into the subarachnoid space. The patient tolerated the procedure well without apparent complication. IMPRESSION: Diagnostic lumbar puncture and intrathecal injection of chemotherapy without complication. Electronically Signed   By: Monte Fantasia M.D.   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 Her CSF cytology was negative though her MRI showed diffuse leptomeningeal enhancement. Patient is status post first cycle of dose adjusted EPOCH-R and has received 3 weekly doses of intrathecal methotrexate plus hydrocortisone. Her EPOCH-R was dose reduced where day 4 of infusional doxorubicin/etoposide/vincristine was held and cyclophosphamide dose was also reduced. 2] protein calorie malnutrition -albumin level seemed to be improving. 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 Today is Day 3 EPOCH-R -shall received IV cyclophosphamide and Rituxan tomorrow. -Hoping to receive the IT methotrexate+hydrocortisone early on fridays so she can leave early afternoon. -labs  From this AM again look hemodiluted with hgb of 7.1. Will recheck Hgb/HCT and transfuse for hgb<8 -Daily CBC, CMP, uric acid  -Will need outpatient Neulasta day 7. (might do it on Saturday if she leaves by early afternoon on Friday). -Will come back for her sixth dose of intrathecal methotrexate on day 14 cycle 2. -We will get a repeat PET CT scan and MRI of the brain prior to cycle 3  -Patient  counseled to ambulate to avoid deconditioning and for DVT prophylaxis since she is not on lovenox for DVT given her intrathecal treatments. SCD while in bed. -PT consulted for ambulation evaluation  I spent 25 minutes counseling the patient face to face. The total time spent in the appointment was 25 minutes and more than 50% was on counseling and direct patient cares.    Sullivan Lone MD [place] AAHIVMS [facility] Hematology/Oncology Physician [facility]  (Office):       [phone] (Work cell):  [phone] (Fax):           [phone]  09/18/2015

## 2015-09-18 NOTE — Telephone Encounter (Signed)
Per staff message and POF I have scheduled appts. Advised scheduler of appts to be all inpatient  JMW

## 2015-09-18 NOTE — Telephone Encounter (Signed)
per pof to sch appt after IR meth-will chck sch after [facility] has sch appt

## 2015-09-19 DIAGNOSIS — D630 Anemia in neoplastic disease: Secondary | ICD-10-CM

## 2015-09-19 LAB — CBC WITH DIFFERENTIAL/PLATELET
BASOS ABS: 0 10*3/uL (ref 0.0–0.1)
Basophils Relative: 0 %
EOS PCT: 0 %
Eosinophils Absolute: 0 10*3/uL (ref 0.0–0.7)
HCT: 29.5 % — ABNORMAL LOW (ref 36.0–46.0)
Hemoglobin: 10 g/dL — ABNORMAL LOW (ref 12.0–15.0)
LYMPHS ABS: 0.7 10*3/uL (ref 0.7–4.0)
LYMPHS PCT: 30 %
MCH: 31 pg (ref 26.0–34.0)
MCHC: 33.9 g/dL (ref 30.0–36.0)
MCV: 91.3 fL (ref 78.0–100.0)
MONO ABS: 0 10*3/uL — AB (ref 0.1–1.0)
Monocytes Relative: 1 %
Neutro Abs: 1.7 10*3/uL (ref 1.7–7.7)
Neutrophils Relative %: 69 %
PLATELETS: 270 10*3/uL (ref 150–400)
RBC: 3.23 MIL/uL — ABNORMAL LOW (ref 3.87–5.11)
RDW: 14.8 % (ref 11.5–15.5)
WBC: 2.5 10*3/uL — ABNORMAL LOW (ref 4.0–10.5)

## 2015-09-19 LAB — TYPE AND SCREEN
ABO/RH(D): O POS
Antibody Screen: NEGATIVE
Unit division: 0
Unit division: 0

## 2015-09-19 LAB — COMPREHENSIVE METABOLIC PANEL
ALT: 19 U/L (ref 14–54)
AST: 14 U/L — ABNORMAL LOW (ref 15–41)
Albumin: 2.6 g/dL — ABNORMAL LOW (ref 3.5–5.0)
Alkaline Phosphatase: 67 U/L (ref 38–126)
Anion gap: 5 (ref 5–15)
BUN: 19 mg/dL (ref 6–20)
CALCIUM: 8.2 mg/dL — AB (ref 8.9–10.3)
CHLORIDE: 102 mmol/L (ref 101–111)
CO2: 30 mmol/L (ref 22–32)
CREATININE: 0.58 mg/dL (ref 0.44–1.00)
Glucose, Bld: 97 mg/dL (ref 65–99)
Potassium: 3.8 mmol/L (ref 3.5–5.1)
Sodium: 137 mmol/L (ref 135–145)
Total Bilirubin: 0.7 mg/dL (ref 0.3–1.2)
Total Protein: 4.9 g/dL — ABNORMAL LOW (ref 6.5–8.1)

## 2015-09-19 LAB — URIC ACID: URIC ACID, SERUM: 3.4 mg/dL (ref 2.3–6.6)

## 2015-09-19 MED ORDER — SODIUM CHLORIDE 0.9 % IV SOLN
400.0000 mg/m2 | Freq: Once | INTRAVENOUS | Status: AC
  Administered 2015-09-19: 780 mg via INTRAVENOUS
  Filled 2015-09-19: qty 39

## 2015-09-19 MED ORDER — EPINEPHRINE HCL 0.1 MG/ML IJ SOSY
0.2500 mg | PREFILLED_SYRINGE | Freq: Once | INTRAMUSCULAR | Status: DC | PRN
  Filled 2015-09-19: qty 10

## 2015-09-19 MED ORDER — DEXAMETHASONE SODIUM PHOSPHATE 4 MG/ML IJ SOLN
8.0000 mg | Freq: Once | INTRAMUSCULAR | Status: AC
  Administered 2015-09-19: 8 mg via INTRAVENOUS
  Filled 2015-09-19: qty 2

## 2015-09-19 MED ORDER — DIPHENHYDRAMINE HCL 50 MG/ML IJ SOLN: 50.0000 mg | Freq: Once | INTRAMUSCULAR | Status: DC | PRN

## 2015-09-19 MED ORDER — SODIUM CHLORIDE 0.9 % IV SOLN: Freq: Once | INTRAVENOUS | Status: DC | PRN

## 2015-09-19 MED ORDER — SODIUM CHLORIDE 0.9 % IV SOLN
375.0000 mg/m2 | Freq: Once | INTRAVENOUS | Status: AC
  Administered 2015-09-19: 700 mg via INTRAVENOUS
  Filled 2015-09-19: qty 50

## 2015-09-19 MED ORDER — HEPARIN SOD (PORK) LOCK FLUSH 100 UNIT/ML IV SOLN: 250.0000 [IU] | Freq: Once | INTRAVENOUS | Status: DC | PRN

## 2015-09-19 MED ORDER — ACETAMINOPHEN 325 MG PO TABS
650.0000 mg | ORAL_TABLET | Freq: Once | ORAL | Status: AC
  Administered 2015-09-19: 650 mg via ORAL
  Filled 2015-09-19: qty 2

## 2015-09-19 MED ORDER — PALONOSETRON HCL INJECTION 0.25 MG/5ML
0.2500 mg | Freq: Once | INTRAVENOUS | Status: AC
  Administered 2015-09-19: 0.25 mg via INTRAVENOUS
  Filled 2015-09-19: qty 5

## 2015-09-19 MED ORDER — ALBUTEROL SULFATE (2.5 MG/3ML) 0.083% IN NEBU: 2.5000 mg | INHALATION_SOLUTION | Freq: Once | RESPIRATORY_TRACT | Status: DC | PRN

## 2015-09-19 MED ORDER — DIPHENHYDRAMINE HCL 50 MG/ML IJ SOLN: 25.0000 mg | Freq: Once | INTRAMUSCULAR | Status: DC | PRN

## 2015-09-19 MED ORDER — EPINEPHRINE HCL 1 MG/ML IJ SOLN
0.5000 mg | Freq: Once | INTRAMUSCULAR | Status: DC | PRN
  Filled 2015-09-19: qty 1

## 2015-09-19 MED ORDER — METHYLPREDNISOLONE SODIUM SUCC 125 MG IJ SOLR
125.0000 mg | Freq: Once | INTRAMUSCULAR | Status: DC | PRN
Start: 2015-09-19 — End: 2015-09-20

## 2015-09-19 MED ORDER — HEPARIN SOD (PORK) LOCK FLUSH 100 UNIT/ML IV SOLN
500.0000 [IU] | Freq: Once | INTRAVENOUS | Status: DC | PRN
  Filled 2015-09-19: qty 5

## 2015-09-19 MED ORDER — FAMOTIDINE IN NACL 20-0.9 MG/50ML-% IV SOLN: 20.0000 mg | Freq: Once | INTRAVENOUS | Status: DC | PRN

## 2015-09-19 MED ORDER — DIPHENHYDRAMINE HCL 50 MG PO CAPS
50.0000 mg | ORAL_CAPSULE | Freq: Once | ORAL | Status: AC
  Administered 2015-09-19: 50 mg via ORAL
  Filled 2015-09-19: qty 1

## 2015-09-19 MED ORDER — ALTEPLASE 2 MG IJ SOLR: 2.0000 mg | Freq: Once | INTRAMUSCULAR | Status: DC | PRN

## 2015-09-19 MED ORDER — SODIUM CHLORIDE 0.9 % IJ SOLN: 3.0000 mL | INTRAMUSCULAR | Status: DC | PRN

## 2015-09-19 MED ORDER — SODIUM CHLORIDE 0.9 % IJ SOLN: 10.0000 mL | INTRAMUSCULAR | Status: DC | PRN

## 2015-09-19 NOTE — Progress Notes (Signed)
Chemotherapy dosages and calculations checked and verified with Aldean Baker, RN

## 2015-09-19 NOTE — Progress Notes (Signed)
[name]   HEMATOLOGY/ONCOLOGY INPATIENT PROGRESS NOTE  Date of Service: 09/19/2015  Inpatient Attending: .Brunetta Genera, MD   SUBJECTIVE  Kristen Baker was seen this morning. She is doing well and has no acute concerns. Getting her cyclophosphamide and rituximab today. Fifth dose of weekly intrathecal methotrexate tomorrow morning. We are planning for discharge hopefully regularly after every 4 hours arrest post intrathecal methotrexate. Has been ambulating several times a day. Eating better. Constipation resolved.  OBJECTIVE:  PHYSICAL EXAMINATION: . Filed Vitals:   09/18/15 1750 09/18/15 2001 09/19/15 0458 09/19/15 1500  BP: 128/51 117/49 120/47 119/45  Pulse: 72 62 53 57  Temp: 98.2 F (36.8 C) 97.6 F (36.4 C) 97.4 F (36.3 C) 97.7 F (36.5 C)  TempSrc: Oral Oral Oral Oral  Resp: 16 16 14 16   Height:      Weight:      SpO2: 95% 96% 96% 95%   Filed Weights   09/16/15 1059 09/17/15 0604  Weight: 166 lb 4.8 oz (75.433 kg) 164 lb 8 oz (74.617 kg)   .Body mass index is 26.56 kg/(m^2).  GENERAL: Elderly lady in no acute distress SKIN: skin color, texture, turgor are normal, no rashes or significant lesions EYES: normal, conjunctiva are pink and non-injected, sclera clear, baseline strabismus. OROPHARYNX:no exudate, no erythema and lips, buccal mucosa, and tongue normal  NECK: supple, no JVD, thyroid normal size, non-tender, without nodularity LYMPH: no palpable lymphadenopathy in the cervical, axillary or inguinal LUNGS: clear to auscultation with normal respiratory effort HEART: regular rate & rhythm, no murmurs and no lower extremity edema ABDOMEN: abdomen soft, non-tender, normoactive bowel sounds  Musculoskeletal: no cyanosis of digits and no clubbing  PSYCH: alert & oriented x 3 with fluent speech NEURO: no focal motor/sensory deficits  MEDICAL HISTORY:  Past Medical History  Diagnosis Date  . Cancer (HCC)     cervical cancer  . Burkitt's lymphoma ([place])  08/29/2015    SURGICAL HISTORY: Past Surgical History  Procedure Laterality Date  . Cholecystectomy    . Tonsillectomy    . Abdominal hysterectomy    . Abdominal surgery      2-3 rd of colon removed    SOCIAL HISTORY: Social History   Social History  . Marital Status: Widowed    Spouse Name: N/A  . Number of Children: N/A  . Years of Education: N/A   Occupational History  . Not on file.   Social History Main Topics  . Smoking status: Never Smoker   . Smokeless tobacco: Never Used  . Alcohol Use: No  . Drug Use: No  . Sexual Activity: No   Other Topics Concern  . Not on file   Social History Narrative    FAMILY HISTORY: History reviewed. No pertinent family history.  ALLERGIES:  is allergic to cabbage; codeine; onion; shellfish allergy; sulfa antibiotics; and zofran.  MEDICATIONS:  Scheduled Meds: . feeding supplement (ENSURE ENLIVE)  237 mL Oral BID BM  . methotrexate INTRATHECAL (+/- HYDROCORTISONE,Ara-C)   Intrathecal Once  . pantoprazole  40 mg Oral Daily  . polyethylene glycol  17 g Oral Daily  . predniSONE  100 mg Oral QAC breakfast  . riTUXimab (RITUXAN) IV infusion  375 mg/m2 (Treatment Plan Actual) Intravenous Once  . senna  2 tablet Oral QHS  . [START ON 09/23/2015] Vitamin D (Ergocalciferol)  50,000 Units Oral Q7 days   Continuous Infusions: . sodium chloride 10 mL/hr at 09/18/15 1425   PRN Meds:.sodium chloride, acetaminophen, albuterol, alteplase, alteplase, alum &  mag hydroxide-simeth, Cold Pack, diphenhydrAMINE, diphenhydrAMINE, EPINEPHrine, EPINEPHrine, EPINEPHrine, EPINEPHrine, famotidine, heparin lock flush, heparin lock flush, heparin lock flush, heparin lock flush, heparin lock flush, heparin lock flush, Hot Pack, lidocaine-prilocaine, methylPREDNISolone sodium succinate, prochlorperazine, senna-docusate, sodium chloride, sodium chloride, sodium chloride, sodium chloride, sodium chloride, sodium chloride  REVIEW OF SYSTEMS:    10 Point  review of Systems was done is negative except as noted above.   LABORATORY DATA:  I have reviewed the data as listed  . CBC Latest Ref Rng 09/19/2015 09/18/2015 09/18/2015  WBC 4.0 - 10.5 K/uL 2.5(L) - 2.7(L)  Hemoglobin 12.0 - 15.0 g/dL 10.0(L) 8.2(L) 7.1(L)  Hematocrit 36.0 - 46.0 % 29.5(L) 25.2(L) 21.6(L)  Platelets 150 - 400 K/uL 270 - 233    . CMP Latest Ref Rng 09/19/2015 09/18/2015 09/17/2015  Glucose 65 - 99 mg/dL 97 106(H) 182(H)  BUN 6 - 20 mg/dL 19 12 11   Creatinine 0.44 - 1.00 mg/dL 0.58 0.57 0.72  Sodium 135 - 145 mmol/L 137 138 137  Potassium 3.5 - 5.1 mmol/L 3.8 3.9 3.7  Chloride 101 - 111 mmol/L 102 104 102  CO2 22 - 32 mmol/L 30 30 27   Calcium 8.9 - 10.3 mg/dL 8.2(L) 8.1(L) 8.4(L)  Total Protein 6.5 - 8.1 g/dL 4.9(L) 4.6(L) 5.4(L)  Total Bilirubin 0.3 - 1.2 mg/dL 0.7 0.5 0.6  Alkaline Phos 38 - 126 U/L 67 66 85  AST 15 - 41 U/L 14(L) 13(L) 15  ALT 14 - 54 U/L 19 16 18      RADIOGRAPHIC STUDIES: I have personally reviewed the radiological images as listed and agreed with the findings in the report. Dg Chest 2 View  09/06/2015  CLINICAL DATA:  Onset of weakness last night, generalized weakness throughout body, recently diagnosed with cervical cancer with first chemotherapy treatment last week, neutropenia, a intermittent midline lower abdominal pain EXAM: CHEST  2 VIEW COMPARISON:  08/18/2015 FINDINGS: RIGHT jugular Port-A-Cath with tip projecting over SVC. Normal heart size, mediastinal contours and pulmonary vascularity. Atherosclerotic calcification aorta. Lungs clear. No pleural effusion or pneumothorax. Bones demineralized. IMPRESSION: No acute abnormalities. Electronically Signed   By: Lavonia Dana M.D.   On: 09/06/2015 14:17   Nm Pet Image Initial (pi) Skull Base To Thigh  08/27/2015  CLINICAL DATA:  Initial treatment strategy for lymphoma. EXAM: NUCLEAR MEDICINE PET VERTEX TO THIGH TECHNIQUE: 8.75 mCi F-18 FDG was injected intravenously. Full-ring PET imaging  was performed from the vertex to thigh after the radiotracer. CT data was obtained and used for attenuation correction and anatomic localization. FASTING BLOOD GLUCOSE:  Value: 126 mg/dl COMPARISON:  08/19/2015 FINDINGS: HEAD AND NECK No abnormal radiotracer accumulation identified within the brain. A small amount of gas is noted within the anterior horn of the left lateral ventricle. No hypermetabolic lymph nodes in the neck. CHEST Hypermetabolic posterior mediastinal soft tissue at the level of the thigh for ppm attic hiatus is identified. This measures 2 x 2.5 cm and has an SUV max equal to 6.9. No enlarged or hypermetabolic mediastinal or hilar lymph nodes. No hypermetabolic axillary or supraclavicular adenopathy. No suspicious pulmonary nodules identified on the CT images. Calcified granuloma is identified within the posterior left upper lobe. ABDOMEN/PELVIS No abnormal hypermetabolic activity within the liver, pancreas, adrenal glands, or spleen. Prominent retroperitoneal lymph nodes are identified. Index pre caval node just above the aortic bifurcation measures 1.3 cm and has an SUV max equal to 3.50. Increased presacral soft tissue is identified which exhibits mild increased uptake. This measures 1.x  5.1 x 4.0 cm. The SUV max within this area is equal to 4.13. SKELETON Diffuse heterogeneous tracer activity is identified throughout the bone marrow of the axial and proximal appendicular skeleton. More focal areas of intense uptake are noted within the thoracic spine, lumbar spine and pelvis. For example cuff within the L3 vertebra there is an area of increased uptake which has an SUV max equal to 6.38. IMPRESSION: 1. Abnormal soft tissue within the posterior mediastinum exhibits malignant range FDG uptake and is compatible with clinical history of lymphoma. 2. There are a few enlarged retroperitoneal lymph nodes within the lower abdomen which exhibits nonspecific FDG uptake. Additionally, there is mild  increased presacral soft tissue exhibiting low level FDG uptake. 3. Diffuse heterogeneous uptake throughout the bone marrow is noted and is worrisome for lymphomatous involvement. Electronically Signed   By: Kerby Moors M.D.   On: 08/27/2015 11:50   Ir Fluoro Guide Cv Line Right  08/26/2015  CLINICAL DATA:  ACCESS FOR CHEMOTHERAPY, BURKITT'S LYMPHOMA EXAM: RIGHT INTERNAL JUGULAR SINGLE LUMEN POWER PORT CATHETER INSERTION Date:  10/17/201610/17/2016 11:08 am Radiologist:  M. Daryll Brod, MD Guidance:  Ultrasound and fluoroscopic FLUOROSCOPY TIME:  30 seconds, 3 mGy MEDICATIONS AND MEDICAL HISTORY: 2 g Ancefadministered within 1 hour of the procedure.3 mg Versed, 50 mcg fentanyl ANESTHESIA/SEDATION: 30 minutes CONTRAST:  None. COMPLICATIONS: None immediate PROCEDURE: Informed consent was obtained from the patient following explanation of the procedure, risks, benefits and alternatives. The patient understands, agrees and consents for the procedure. All questions were addressed. A time out was performed. Maximal barrier sterile technique utilized including caps, mask, sterile gowns, sterile gloves, large sterile drape, hand hygiene, and 2% chlorhexidine scrub. Under sterile conditions and local anesthesia, right internal jugular micropuncture venous access was performed. Access was performed with ultrasound. Images were obtained for documentation. A guide wire was inserted followed by a transitional dilator. This allowed insertion of a guide wire and catheter into the IVC. Measurements were obtained from the SVC / RA junction back to the right IJ venotomy site. In the right infraclavicular chest, a subcutaneous pocket was created over the second anterior rib. This was done under sterile conditions and local anesthesia. 1% lidocaine with epinephrine was utilized for this. A 2.5 cm incision was made in the skin. Blunt dissection was performed to create a subcutaneous pocket over the right pectoralis major muscle.  The pocket was flushed with saline vigorously. There was adequate hemostasis. The port catheter was assembled and checked for leakage. The port catheter was secured in the pocket with two retention sutures. The tubing was tunneled subcutaneously to the right venotomy site and inserted into the SVC/RA junction through a valved peel-away sheath. Position was confirmed with fluoroscopy. Images were obtained for documentation. The patient tolerated the procedure well. No immediate complications. Incisions were closed in a two layer fashion with 4 - 0 Vicryl suture. Dermabond was applied to the skin. The port catheter was accessed, blood was aspirated followed by saline and heparin flushes. Needle was removed. A dry sterile dressing was applied. IMPRESSION: Ultrasound and fluoroscopically guided right internal jugular single lumen power port catheter insertion. Tip in the SVC/RA junction. Catheter ready for use. Electronically Signed   By: Jerilynn Mages.  Shick M.D.   On: 08/26/2015 11:44   Ir US Guide Vasc Access Right  08/26/2015  CLINICAL DATA:  ACCESS FOR CHEMOTHERAPY, BURKITT'S LYMPHOMA EXAM: RIGHT INTERNAL JUGULAR SINGLE LUMEN POWER PORT CATHETER INSERTION Date:  10/17/201610/17/2016 11:08 am Radiologist:  M. Daryll Brod, MD  Guidance:  Ultrasound and fluoroscopic FLUOROSCOPY TIME:  30 seconds, 3 mGy MEDICATIONS AND MEDICAL HISTORY: 2 g Ancefadministered within 1 hour of the procedure.3 mg Versed, 50 mcg fentanyl ANESTHESIA/SEDATION: 30 minutes CONTRAST:  None. COMPLICATIONS: None immediate PROCEDURE: Informed consent was obtained from the patient following explanation of the procedure, risks, benefits and alternatives. The patient understands, agrees and consents for the procedure. All questions were addressed. A time out was performed. Maximal barrier sterile technique utilized including caps, mask, sterile gowns, sterile gloves, large sterile drape, hand hygiene, and 2% chlorhexidine scrub. Under sterile conditions and  local anesthesia, right internal jugular micropuncture venous access was performed. Access was performed with ultrasound. Images were obtained for documentation. A guide wire was inserted followed by a transitional dilator. This allowed insertion of a guide wire and catheter into the IVC. Measurements were obtained from the SVC / RA junction back to the right IJ venotomy site. In the right infraclavicular chest, a subcutaneous pocket was created over the second anterior rib. This was done under sterile conditions and local anesthesia. 1% lidocaine with epinephrine was utilized for this. A 2.5 cm incision was made in the skin. Blunt dissection was performed to create a subcutaneous pocket over the right pectoralis major muscle. The pocket was flushed with saline vigorously. There was adequate hemostasis. The port catheter was assembled and checked for leakage. The port catheter was secured in the pocket with two retention sutures. The tubing was tunneled subcutaneously to the right venotomy site and inserted into the SVC/RA junction through a valved peel-away sheath. Position was confirmed with fluoroscopy. Images were obtained for documentation. The patient tolerated the procedure well. No immediate complications. Incisions were closed in a two layer fashion with 4 - 0 Vicryl suture. Dermabond was applied to the skin. The port catheter was accessed, blood was aspirated followed by saline and heparin flushes. Needle was removed. A dry sterile dressing was applied. IMPRESSION: Ultrasound and fluoroscopically guided right internal jugular single lumen power port catheter insertion. Tip in the SVC/RA junction. Catheter ready for use. Electronically Signed   By: Jerilynn Mages.  Shick M.D.   On: 08/26/2015 11:44   Ct Biopsy  08/21/2015  CLINICAL DATA:  Suspected lymphoma EXAM: CT-GUIDED BIOPSY BONE MARROW AND PARA-AORTIC LYMPH NODE. MEDICATIONS AND MEDICAL HISTORY: Versed 2 mg, Fentanyl 100 mcg. Additional Medications: None.  ANESTHESIA/SEDATION: Moderate sedation time: Third minutes PROCEDURE: The procedure, risks, benefits, and alternatives were explained to the patient. Questions regarding the procedure were encouraged and answered. The patient understands and consents to the procedure. The back was prepped with Betadine in a sterile fashion, and a sterile drape was applied covering the operative field. A sterile gown and sterile gloves were used for the procedure. Under CT guidance, an 11 gauge needle was inserted into the right iliac bone via posterior approach. Aspirates and a core were obtained Under CT guidance, an 17 gauge needle was inserted into the para caval lymph node via right posterior lateral approach. Four 18 gauge core biopsies were obtained. The guide needle was removed. Patient tolerated the procedure well without complication. Vital sign monitoring by nursing staff during the procedure will continue as patient is in the special procedures unit for post procedure observation. FINDINGS: The images document guide needle placement within the right iliac bone and pericaval lymph node. Post biopsy images demonstrate no hemorrhage. COMPLICATIONS: None IMPRESSION: Successful CT-guided bone marrow aspirate, bone marrow core, and pericaval lymph node core which was placed in saline for lymphoma flow analysis. Electronically Signed  By: Marybelle Killings M.D.   On: 08/21/2015 14:10   Ct Biopsy  08/21/2015  CLINICAL DATA:  Suspected lymphoma EXAM: CT-GUIDED BIOPSY BONE MARROW AND PARA-AORTIC LYMPH NODE. MEDICATIONS AND MEDICAL HISTORY: Versed 2 mg, Fentanyl 100 mcg. Additional Medications: None. ANESTHESIA/SEDATION: Moderate sedation time: Third minutes PROCEDURE: The procedure, risks, benefits, and alternatives were explained to the patient. Questions regarding the procedure were encouraged and answered. The patient understands and consents to the procedure. The back was prepped with Betadine in a sterile fashion, and a  sterile drape was applied covering the operative field. A sterile gown and sterile gloves were used for the procedure. Under CT guidance, an 11 gauge needle was inserted into the right iliac bone via posterior approach. Aspirates and a core were obtained Under CT guidance, an 17 gauge needle was inserted into the para caval lymph node via right posterior lateral approach. Four 18 gauge core biopsies were obtained. The guide needle was removed. Patient tolerated the procedure well without complication. Vital sign monitoring by nursing staff during the procedure will continue as patient is in the special procedures unit for post procedure observation. FINDINGS: The images document guide needle placement within the right iliac bone and pericaval lymph node. Post biopsy images demonstrate no hemorrhage. COMPLICATIONS: None IMPRESSION: Successful CT-guided bone marrow aspirate, bone marrow core, and pericaval lymph node core which was placed in saline for lymphoma flow analysis. Electronically Signed   By: Marybelle Killings M.D.   On: 08/21/2015 14:10   Dg Fluoro Guide Lumbar Puncture  09/16/2015  CLINICAL DATA:  Lymphoma, methotrexate injection. EXAM: FLUOROSCOPICALLY GUIDED LUMBAR PUNCTURE FOR INTRATHECAL CHEMOTHERAPY TECHNIQUE: Informed consent was obtained from the patient prior to the procedure, including potential complications of headache, allergy, and pain. A 'time out' was performed. With the patient prone, the lower back was prepped with Betadine. 1% Lidocaine was used for local anesthesia. Lumbar puncture was performed at the L5-S1 level using a 20 gauge needle with return of clear CSF. 12 mg Of methotrexate was injected into the subarachnoid space. The patient tolerated the procedure well without apparent complication. FLUOROSCOPY TIME:  Fluoroscopy Time (in minutes and seconds): 0 minutes 40 seconds. Number of Acquired Images:  None. IMPRESSION: 1. Intrathecal injection of chemotherapy without complication. 2.  Due to extremely slow flow, less than 1 cc CSF was collected for laboratory evaluation. Electronically Signed   By: Lorin Picket M.D.   On: 09/16/2015 16:18   Dg Fluoro Guide Lumbar Puncture  09/09/2015  CLINICAL DATA:  Burkitt's lymphoma with leptomeningeal involvement. Third dose intrathecal chemotherapy EXAM: FLUOROSCOPICALLY GUIDED LUMBAR PUNCTURE FOR INTRATHECAL CHEMOTHERAPY TECHNIQUE: Informed consent was obtained from the patient prior to the procedure, including potential complications of headache, allergy, and pain. A 'time out' was performed. With the patient prone, the lower back was prepped with Betadine. 1% Lidocaine was used for local anesthesia. Lumbar puncture was performed at the L5-S1 using a 22 gauge needle with return of clear CSF. 12 mg methotrexate and 50 mg hydrocortisone pharmacy prepared was injected into the subarachnoid space. The patient tolerated the procedure well without apparent complication. FLUOROSCOPY TIME:  1 minutes 11 seconds IMPRESSION: Intrathecal injection of chemotherapy without complication Electronically Signed   By: Suzy Bouchard M.D.   On: 09/09/2015 11:08   Dg Fluoro Guide Lumbar Puncture  08/30/2015  CLINICAL DATA:  History of Burkitt's lymphoma. Diagnostic lumbar puncture with intrathecal chemotherapy injection. Subsequent encounter. EXAM: DIAGNOSTIC LUMBAR PUNCTURE UNDER FLUOROSCOPIC GUIDANCE FLUOROSCOPY TIME:  Radiation Exposure Index (as  provided by the fluoroscopic device): If the device does not provide the exposure index: Fluoroscopy Time (in minutes and seconds):  44 seconds Number of Acquired Images:  0. PROCEDURE: Informed consent was obtained from the patient prior to the procedure, including potential complications of headache, allergy, and pain. With the patient prone, the lower back was prepped with Betadine. 1% Lidocaine was used for local anesthesia. Lumbar puncture was performed at the L5-S1 level using a 20 gauge needle with return of  initially blood tinged but clearing. Fourteen ml of CSF were obtained for laboratory studies. Laboratory prepared methotrexate was injected into the subarachnoid space without difficulty. The patient tolerated the procedure well and there were no apparent complications. IMPRESSION: Diagnostic lumbar puncture and intrathecal injection of methotrexate without complication. Electronically Signed   By: Inge Rise M.D.   On: 08/30/2015 15:44   Dg Fluoro Guide Lumbar Puncture  08/26/2015  CLINICAL DATA:  Diagnostic lumbar puncture with intrathecal chemotherapy injection EXAM: FLUOROSCOPICALLY GUIDED LUMBAR PUNCTURE FOR INTRATHECAL CHEMOTHERAPY FLUOROSCOPY TIME:  dictate in minutes and seconds Radiation Exposure Index (as provided by the fluoroscopic device): Not available on this device If the device does not provide the exposure index: Fluoroscopy Time (in minutes and seconds):  1 minutes 32 seconds Number of Acquired Images:  1 PROCEDURE: Informed consent was obtained from the patient prior to the procedure, including potential complications of headache, allergy, and pain. With the patient prone, the lower back was prepped with Betadine. 1% Lidocaine was used for local anesthesia. Lumbar puncture was performed at the L3-4 level without success, likely due to ligamentous ossification. After numbing, attempt was then made at the L2-3 level, with dry tap despite good needle placement. After numbing, needle placed at the L5-S1 level with easy access to somewhat low pressure CSF (patient NPO for port placement this am). A 20 gauge needle was used. 11 cc of clear CSF were obtained for laboratory studies. The entire syringe of laboratory prepared methotrexate was then injected into the subarachnoid space. The patient tolerated the procedure well without apparent complication. IMPRESSION: Diagnostic lumbar puncture and intrathecal injection of chemotherapy without complication. Electronically Signed   By: Monte Fantasia M.D.   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 Her CSF cytology was negative though her MRI showed diffuse leptomeningeal enhancement. Patient is status post first cycle of dose adjusted EPOCH-R and has received 3 weekly doses of intrathecal methotrexate plus hydrocortisone. Her EPOCH-R was dose reduced where day 4 of infusional doxorubicin/etoposide/vincristine was held and cyclophosphamide dose was also reduced. 2] protein calorie malnutrition -albumin level seemed to be improving. 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 Hemoglobin of after blood transfusion. Feeling much better after PRBC transfusion. Plan -patient tolerating chemotherapy well thus far. Today is Day 4/4 daEPOCH-R receiving IV cyclophosphamide and Rituxan today. -Hoping to receive the IT methotrexate+hydrocortisone early on tomorrow so she can leave early afternoon. -Daily CBC, CMP, uric acid  -Will need outpatient Neulasta day 7. (might do it on Saturday if she leaves by early afternoon on Friday). -Will come back for her sixth dose of intrathecal methotrexate on day 14 cycle 2. -We will get a repeat PET CT scan and MRI of the brain prior to cycle 3  -Patient counseled to ambulate to avoid deconditioning and for DVT prophylaxis since she is not on lovenox for DVT given her intrathecal  treatments. SCD while in bed. -PT consulted for ambulation evaluation  We'll plan to discharge the patient after adequate bedrest post intrathecal methotrexate tomorrow.  I spent 25 minutes counseling the patient face to face. The total time spent in the appointment was 25 minutes and more than 50% was on counseling and direct patient cares.    Kristen Lone MD [place] AAHIVMS [facility] Hematology/Oncology Physician [facility]  (Office):       [phone] (Work cell):   [phone] (Fax):           [phone]  09/19/2015

## 2015-09-19 NOTE — Care Management Important Message (Signed)
Important Message  Patient Details  Name: Kristen Baker MRN: [id number] Date of Birth: [birth year]   Medicare Important Message Given:  Yes    [name] 09/19/2015, 11:50 AMImportant Message  Patient Details  Name: Kristen Baker MRN: [id number] Date of Birth: 04-Feb-1933   Medicare Important Message Given:  Yes    [name] 09/19/2015, 11:49 AM

## 2015-09-19 NOTE — Care Management Note (Signed)
Case Management Note  Patient Details  Name: Kristen Baker MRN: [id number] Date of Birth: 07-12-1933  Subjective/Objective:          81 yo admitted with Burkitt Lymphoma for chemo          Action/Plan: From home with children and [facility]. Pt will need resumption orders for HHRN/PT and aide at DC.  Expected Discharge Date:   (UNKNOWN)               Expected Discharge Plan:  [place]  In-House Referral:     Discharge planning Services  CM Consult  Post Acute Care Choice:  Resumption of Svcs/PTA Provider Choice offered to:     DME Arranged:    DME Agency:     HH Arranged:  RN, PT, Nurse's Aide [place] Agency:  [place]  Status of Service:  In process, will continue to follow  Medicare Important Message Given:  Yes Date Medicare IM Given:    Medicare IM give by:    Date Additional Medicare IM Given:    Additional Medicare Important Message give by:     If discussed at [place] of Stay Meetings, dates discussed:    Additional Comments:  Lynnell Catalan, RN 09/19/2015, 2:58 PM

## 2015-09-19 NOTE — Evaluation (Signed)
Physical Therapy Evaluation Patient Details Name: Adora Candanoza MRN: [id number] DOB: Oct 24, 1933 Today's Date: 09/19/2015   History of Present Illness  81 yo female admitted with Burkitt lymphoma with CNS involvement. LP 09/16/15. Hx of cervical cancer.  Clinical Impression  On eval, pt was Min guard assist for mobility-walked ~500 feet around unit. Some unsteadiness noted when walking without UE support. Recommend RW use at home as needed. Recommend HHPT follow up at discharge to maximize independence and safety with functional mobility.     Follow Up Recommendations HH PT;Supervision - Intermittent    Equipment Recommendations  None recommended by PT    Recommendations for Other Services       Precautions / Restrictions Precautions Precautions: Fall Precaution Comments: chemo Restrictions Weight Bearing Restrictions: No      Mobility  Bed Mobility Overal bed mobility: Modified Independent                Transfers Overall transfer level: Modified independent                  Ambulation/Gait Ambulation/Gait assistance: Min guard Ambulation Distance (Feet): 500 Feet (450'x1 with pole; 50'x1 without support)   Gait Pattern/deviations: Step-through pattern;Decreased stride length     General Gait Details: close guard for safety. Less steady without 1 UE support. good gait speed  Stairs            Wheelchair Mobility    Modified Rankin (Stroke Patients Only)       Balance           Standing balance support: During functional activity Standing balance-Leahy Scale: Fair                               Pertinent Vitals/Pain Pain Assessment: No/denies pain    Home Living Family/patient expects to be discharged to:: Private residence Living Arrangements: Children Available Help at Discharge: Family Type of Home: House Home Access: Level entry       Home Equipment: Environmental consultant - 2 wheels      Prior Function Level of  Independence: Independent               Hand Dominance        Extremity/Trunk Assessment   Upper Extremity Assessment: Overall WFL for tasks assessed           Lower Extremity Assessment: Generalized weakness      Cervical / Trunk Assessment: Normal  Communication   Communication: No difficulties  Cognition Arousal/Alertness: Awake/alert Behavior During Therapy: WFL for tasks assessed/performed Overall Cognitive Status: Within Functional Limits for tasks assessed                      General Comments      Exercises        Assessment/Plan    PT Assessment Patient needs continued PT services  PT Diagnosis Difficulty walking;Generalized weakness   PT Problem List Decreased mobility;Decreased balance;Decreased strength  PT Treatment Interventions Gait training;Functional mobility training;Therapeutic activities;Balance training;Therapeutic exercise   PT Goals (Current goals can be found in the Care Plan section) Acute Rehab PT Goals Patient Stated Goal: none stated PT Goal Formulation: With patient Time For Goal Achievement: 10/03/15 Potential to Achieve Goals: Fair    Frequency Min 3X/week   Barriers to discharge        Co-evaluation               End of  Session   Activity Tolerance: Patient tolerated treatment well Patient left: in bed;with call bell/phone within reach;with bed alarm set           Time: 1009-1018 PT Time Calculation (min) (ACUTE ONLY): 9 min   Charges:   PT Evaluation $Initial PT Evaluation Tier I: 1 Procedure     PT G Codes:        Weston Anna, MPT Pager: [phone]

## 2015-09-20 ENCOUNTER — Telehealth: Payer: Self-pay | Admitting: Hematology

## 2015-09-20 ENCOUNTER — Inpatient Hospital Stay (HOSPITAL_COMMUNITY): Payer: Medicare Other

## 2015-09-20 ENCOUNTER — Other Ambulatory Visit: Payer: Self-pay | Admitting: Hematology

## 2015-09-20 DIAGNOSIS — G96198 Other disorders of meninges, not elsewhere classified: Secondary | ICD-10-CM

## 2015-09-20 DIAGNOSIS — D638 Anemia in other chronic diseases classified elsewhere: Secondary | ICD-10-CM

## 2015-09-20 DIAGNOSIS — G039 Meningitis, unspecified: Secondary | ICD-10-CM

## 2015-09-20 DIAGNOSIS — C8378 Burkitt lymphoma, lymph nodes of multiple sites: Secondary | ICD-10-CM

## 2015-09-20 LAB — COMPREHENSIVE METABOLIC PANEL
ALBUMIN: 2.5 g/dL — AB (ref 3.5–5.0)
ALK PHOS: 59 U/L (ref 38–126)
ALT: 17 U/L (ref 14–54)
AST: 13 U/L — AB (ref 15–41)
Anion gap: 5 (ref 5–15)
BILIRUBIN TOTAL: 0.7 mg/dL (ref 0.3–1.2)
BUN: 17 mg/dL (ref 6–20)
CALCIUM: 8.1 mg/dL — AB (ref 8.9–10.3)
CO2: 29 mmol/L (ref 22–32)
Chloride: 103 mmol/L (ref 101–111)
Creatinine, Ser: 0.63 mg/dL (ref 0.44–1.00)
GFR calc Af Amer: >60 mL/min (ref >60)
GFR calc non Af Amer: >60 mL/min (ref >60)
GLUCOSE: 101 mg/dL — AB (ref 65–99)
Potassium: 3.9 mmol/L (ref 3.5–5.1)
Sodium: 137 mmol/L (ref 135–145)
TOTAL PROTEIN: 4.5 g/dL — AB (ref 6.5–8.1)

## 2015-09-20 LAB — GLUCOSE, CSF: Glucose, CSF: 63 mg/dL (ref 40–70)

## 2015-09-20 LAB — URIC ACID: Uric Acid, Serum: 3.1 mg/dL (ref 2.3–6.6)

## 2015-09-20 LAB — CSF CELL COUNT WITH DIFFERENTIAL
RBC COUNT CSF: 7200 /mm3 — AB
TUBE #: 1
WBC, CSF: 2 /mm3 (ref 0–5)

## 2015-09-20 LAB — CBC WITH DIFFERENTIAL/PLATELET
BASOS ABS: 0 10*3/uL (ref 0.0–0.1)
BASOS PCT: 0 %
Eosinophils Absolute: 0 10*3/uL (ref 0.0–0.7)
Eosinophils Relative: 0 %
HEMATOCRIT: 27.1 % — AB (ref 36.0–46.0)
HEMOGLOBIN: 9.2 g/dL — AB (ref 12.0–15.0)
Lymphocytes Relative: 27 %
Lymphs Abs: 0.6 10*3/uL — ABNORMAL LOW (ref 0.7–4.0)
MCH: 30.9 pg (ref 26.0–34.0)
MCHC: 33.9 g/dL (ref 30.0–36.0)
MCV: 90.9 fL (ref 78.0–100.0)
Monocytes Absolute: 0 10*3/uL — ABNORMAL LOW (ref 0.1–1.0)
Monocytes Relative: 0 %
NEUTROS ABS: 1.7 10*3/uL (ref 1.7–7.7)
NEUTROS PCT: 73 %
Platelets: 257 10*3/uL (ref 150–400)
RBC: 2.98 MIL/uL — AB (ref 3.87–5.11)
RDW: 14.3 % (ref 11.5–15.5)
WBC: 2.4 10*3/uL — ABNORMAL LOW (ref 4.0–10.5)

## 2015-09-20 LAB — PROTEIN, CSF: Total  Protein, CSF: 38 mg/dL (ref 15–45)

## 2015-09-20 MED ORDER — SODIUM CHLORIDE 0.9 % IJ SOLN
Freq: Once | INTRAMUSCULAR | Status: DC
  Filled 2015-09-20: qty 0.48

## 2015-09-20 MED ORDER — HEPARIN SOD (PORK) LOCK FLUSH 100 UNIT/ML IV SOLN
500.0000 [IU] | INTRAVENOUS | Status: DC | PRN
  Administered 2015-09-20: 500 [IU]

## 2015-09-20 MED ORDER — POLYETHYLENE GLYCOL 3350 17 G PO PACK: 17.0000 g | PACK | Freq: Every day | ORAL | Status: DC

## 2015-09-20 MED ORDER — HEPARIN SOD (PORK) LOCK FLUSH 100 UNIT/ML IV SOLN: 500.0000 [IU] | INTRAVENOUS | Status: DC

## 2015-09-20 MED ORDER — SENNA 8.6 MG PO TABS: 2.0000 | ORAL_TABLET | Freq: Every day | ORAL | Status: AC

## 2015-09-20 NOTE — Telephone Encounter (Signed)
Central will scheduled and contact patient with IT Metotrexate for 11/21. Not other order per 11/11 pof. [facility] working on other appointments.

## 2015-09-20 NOTE — Discharge Instructions (Signed)
-  followup for Neulasta (G-GSF) shot on 09/23/2015 at Cancer clinic -followup as per appointment for labs, clinic Appointment with Dr Irene Limbo on 09/30/2015. -call Dr Grier Mitts clinic/on call oncologist for any new concerns or questions.

## 2015-09-20 NOTE — Procedures (Signed)
Procedure: LP with fluoro for CSF analysis and chemo injection. Specimen: 4.5 mL CSF to lab Bleeding: small. Complications: None immediate. Patient   -Condition: Stable.  -Disposition:  To floor.  Full Radiology Report to Follow under IMAGING

## 2015-09-20 NOTE — Discharge Summary (Addendum)
.  [place]  [phone] [phone]    Physician Discharge Summary     Patient ID: Kristen Baker MRN: [id number] DOB/AGE: 14-Mar-1933 81 y.o.  Admit date: 09/16/2015 Discharge date: 09/20/2015  Primary Care Physician:  Sullivan Lone, MD   Discharge Diagnoses:    Present on Admission:  . 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 Leptomeningeal disease . Anemia due to other cause  Discharge Medications:    Medication List    STOP taking these medications        allopurinol 100 MG tablet  Commonly known as:  ZYLOPRIM     dexamethasone 4 MG tablet  Commonly known as:  DECADRON     naproxen sodium 220 MG tablet  Commonly known as:  ANAPROX      TAKE these medications        omeprazole 40 MG capsule  Commonly known as:  PRILOSEC  Take 40 mg by mouth daily.     polyethylene glycol packet  Commonly known as:  MIRALAX / GLYCOLAX  Take 17 g by mouth daily.     prochlorperazine 10 MG tablet  Commonly known as:  COMPAZINE  Take 1 tablet (10 mg total) by mouth every 6 (six) hours as needed for nausea or vomiting.     senna 8.6 MG Tabs tablet  Commonly known as:  SENOKOT  Take 2 tablets (17.2 mg total) by mouth at bedtime.     traMADol 50 MG tablet  Commonly known as:  ULTRAM  Take 1 tablet (50 mg total) by mouth every 6 (six) hours as needed for moderate pain.     Vitamin D (Ergocalciferol) 50000 UNITS Caps capsule  Commonly known as:  DRISDOL  Take 50,000 Units by mouth every 7 (seven) days. Take on Tuesdays         Disposition and Follow-up:   Significant Diagnostic Studies:  Dg Chest 2 View  09/06/2015  CLINICAL DATA:  Onset of weakness last night, generalized weakness throughout body, recently diagnosed with cervical cancer with first chemotherapy treatment last week, neutropenia, a intermittent midline lower abdominal pain EXAM: CHEST  2 VIEW COMPARISON:  08/18/2015 FINDINGS: RIGHT jugular Port-A-Cath with tip projecting  over SVC. Normal heart size, mediastinal contours and pulmonary vascularity. Atherosclerotic calcification aorta. Lungs clear. No pleural effusion or pneumothorax. Bones demineralized. IMPRESSION: No acute abnormalities. Electronically Signed   By: Lavonia Dana M.D.   On: 09/06/2015 14:17   Nm Pet Image Initial (pi) Skull Base To Thigh  08/27/2015  CLINICAL DATA:  Initial treatment strategy for lymphoma. EXAM: NUCLEAR MEDICINE PET VERTEX TO THIGH TECHNIQUE: 8.75 mCi F-18 FDG was injected intravenously. Full-ring PET imaging was performed from the vertex to thigh after the radiotracer. CT data was obtained and used for attenuation correction and anatomic localization. FASTING BLOOD GLUCOSE:  Value: 126 mg/dl COMPARISON:  08/19/2015 FINDINGS: HEAD AND NECK No abnormal radiotracer accumulation identified within the brain. A small amount of gas is noted within the anterior horn of the left lateral ventricle. No hypermetabolic lymph nodes in the neck. CHEST Hypermetabolic posterior mediastinal soft tissue at the level of the thigh for ppm attic hiatus is identified. This measures 2 x 2.5 cm and has an SUV max equal to 6.9. No enlarged or hypermetabolic mediastinal or hilar lymph nodes. No hypermetabolic axillary or supraclavicular adenopathy. No suspicious pulmonary nodules identified on the CT images. Calcified granuloma is identified within the posterior left upper lobe. ABDOMEN/PELVIS No abnormal hypermetabolic activity within the liver, pancreas, adrenal  glands, or spleen. Prominent retroperitoneal lymph nodes are identified. Index pre caval node just above the aortic bifurcation measures 1.3 cm and has an SUV max equal to 3.50. Increased presacral soft tissue is identified which exhibits mild increased uptake. This measures 1.x 5.1 x 4.0 cm. The SUV max within this area is equal to 4.13. SKELETON Diffuse heterogeneous tracer activity is identified throughout the bone marrow of the axial and proximal appendicular  skeleton. More focal areas of intense uptake are noted within the thoracic spine, lumbar spine and pelvis. For example cuff within the L3 vertebra there is an area of increased uptake which has an SUV max equal to 6.38. IMPRESSION: 1. Abnormal soft tissue within the posterior mediastinum exhibits malignant range FDG uptake and is compatible with clinical history of lymphoma. 2. There are a few enlarged retroperitoneal lymph nodes within the lower abdomen which exhibits nonspecific FDG uptake. Additionally, there is mild increased presacral soft tissue exhibiting low level FDG uptake. 3. Diffuse heterogeneous uptake throughout the bone marrow is noted and is worrisome for lymphomatous involvement. Electronically Signed   By: Kerby Moors M.D.   On: 08/27/2015 11:50   Ir Fluoro Guide Cv Line Right  08/26/2015  CLINICAL DATA:  ACCESS FOR CHEMOTHERAPY, BURKITT'S LYMPHOMA EXAM: RIGHT INTERNAL JUGULAR SINGLE LUMEN POWER PORT CATHETER INSERTION Date:  10/17/201610/17/2016 11:08 am Radiologist:  M. Daryll Brod, MD Guidance:  Ultrasound and fluoroscopic FLUOROSCOPY TIME:  30 seconds, 3 mGy MEDICATIONS AND MEDICAL HISTORY: 2 g Ancefadministered within 1 hour of the procedure.3 mg Versed, 50 mcg fentanyl ANESTHESIA/SEDATION: 30 minutes CONTRAST:  None. COMPLICATIONS: None immediate PROCEDURE: Informed consent was obtained from the patient following explanation of the procedure, risks, benefits and alternatives. The patient understands, agrees and consents for the procedure. All questions were addressed. A time out was performed. Maximal barrier sterile technique utilized including caps, mask, sterile gowns, sterile gloves, large sterile drape, hand hygiene, and 2% chlorhexidine scrub. Under sterile conditions and local anesthesia, right internal jugular micropuncture venous access was performed. Access was performed with ultrasound. Images were obtained for documentation. A guide wire was inserted followed by a  transitional dilator. This allowed insertion of a guide wire and catheter into the IVC. Measurements were obtained from the SVC / RA junction back to the right IJ venotomy site. In the right infraclavicular chest, a subcutaneous pocket was created over the second anterior rib. This was done under sterile conditions and local anesthesia. 1% lidocaine with epinephrine was utilized for this. A 2.5 cm incision was made in the skin. Blunt dissection was performed to create a subcutaneous pocket over the right pectoralis major muscle. The pocket was flushed with saline vigorously. There was adequate hemostasis. The port catheter was assembled and checked for leakage. The port catheter was secured in the pocket with two retention sutures. The tubing was tunneled subcutaneously to the right venotomy site and inserted into the SVC/RA junction through a valved peel-away sheath. Position was confirmed with fluoroscopy. Images were obtained for documentation. The patient tolerated the procedure well. No immediate complications. Incisions were closed in a two layer fashion with 4 - 0 Vicryl suture. Dermabond was applied to the skin. The port catheter was accessed, blood was aspirated followed by saline and heparin flushes. Needle was removed. A dry sterile dressing was applied. IMPRESSION: Ultrasound and fluoroscopically guided right internal jugular single lumen power port catheter insertion. Tip in the SVC/RA junction. Catheter ready for use. Electronically Signed   By: Jerilynn Mages.  Shick M.D.  On: 08/26/2015 11:44   Ir US Guide Vasc Access Right  08/26/2015  CLINICAL DATA:  ACCESS FOR CHEMOTHERAPY, BURKITT'S LYMPHOMA EXAM: RIGHT INTERNAL JUGULAR SINGLE LUMEN POWER PORT CATHETER INSERTION Date:  10/17/201610/17/2016 11:08 am Radiologist:  M. Daryll Brod, MD Guidance:  Ultrasound and fluoroscopic FLUOROSCOPY TIME:  30 seconds, 3 mGy MEDICATIONS AND MEDICAL HISTORY: 2 g Ancefadministered within 1 hour of the procedure.3 mg Versed,  50 mcg fentanyl ANESTHESIA/SEDATION: 30 minutes CONTRAST:  None. COMPLICATIONS: None immediate PROCEDURE: Informed consent was obtained from the patient following explanation of the procedure, risks, benefits and alternatives. The patient understands, agrees and consents for the procedure. All questions were addressed. A time out was performed. Maximal barrier sterile technique utilized including caps, mask, sterile gowns, sterile gloves, large sterile drape, hand hygiene, and 2% chlorhexidine scrub. Under sterile conditions and local anesthesia, right internal jugular micropuncture venous access was performed. Access was performed with ultrasound. Images were obtained for documentation. A guide wire was inserted followed by a transitional dilator. This allowed insertion of a guide wire and catheter into the IVC. Measurements were obtained from the SVC / RA junction back to the right IJ venotomy site. In the right infraclavicular chest, a subcutaneous pocket was created over the second anterior rib. This was done under sterile conditions and local anesthesia. 1% lidocaine with epinephrine was utilized for this. A 2.5 cm incision was made in the skin. Blunt dissection was performed to create a subcutaneous pocket over the right pectoralis major muscle. The pocket was flushed with saline vigorously. There was adequate hemostasis. The port catheter was assembled and checked for leakage. The port catheter was secured in the pocket with two retention sutures. The tubing was tunneled subcutaneously to the right venotomy site and inserted into the SVC/RA junction through a valved peel-away sheath. Position was confirmed with fluoroscopy. Images were obtained for documentation. The patient tolerated the procedure well. No immediate complications. Incisions were closed in a two layer fashion with 4 - 0 Vicryl suture. Dermabond was applied to the skin. The port catheter was accessed, blood was aspirated followed by saline and  heparin flushes. Needle was removed. A dry sterile dressing was applied. IMPRESSION: Ultrasound and fluoroscopically guided right internal jugular single lumen power port catheter insertion. Tip in the SVC/RA junction. Catheter ready for use. Electronically Signed   By: Jerilynn Mages.  Shick M.D.   On: 08/26/2015 11:44   Ct Biopsy  08/21/2015  CLINICAL DATA:  Suspected lymphoma EXAM: CT-GUIDED BIOPSY BONE MARROW AND PARA-AORTIC LYMPH NODE. MEDICATIONS AND MEDICAL HISTORY: Versed 2 mg, Fentanyl 100 mcg. Additional Medications: None. ANESTHESIA/SEDATION: Moderate sedation time: Third minutes PROCEDURE: The procedure, risks, benefits, and alternatives were explained to the patient. Questions regarding the procedure were encouraged and answered. The patient understands and consents to the procedure. The back was prepped with Betadine in a sterile fashion, and a sterile drape was applied covering the operative field. A sterile gown and sterile gloves were used for the procedure. Under CT guidance, an 11 gauge needle was inserted into the right iliac bone via posterior approach. Aspirates and a core were obtained Under CT guidance, an 17 gauge needle was inserted into the para caval lymph node via right posterior lateral approach. Four 18 gauge core biopsies were obtained. The guide needle was removed. Patient tolerated the procedure well without complication. Vital sign monitoring by nursing staff during the procedure will continue as patient is in the special procedures unit for post procedure observation. FINDINGS: The images document guide needle  placement within the right iliac bone and pericaval lymph node. Post biopsy images demonstrate no hemorrhage. COMPLICATIONS: None IMPRESSION: Successful CT-guided bone marrow aspirate, bone marrow core, and pericaval lymph node core which was placed in saline for lymphoma flow analysis. Electronically Signed   By: Marybelle Killings M.D.   On: 08/21/2015 14:10   Dg Fluoro Guide Lumbar  Puncture  09/16/2015  CLINICAL DATA:  Lymphoma, methotrexate injection. EXAM: FLUOROSCOPICALLY GUIDED LUMBAR PUNCTURE FOR INTRATHECAL CHEMOTHERAPY TECHNIQUE: Informed consent was obtained from the patient prior to the procedure, including potential complications of headache, allergy, and pain. A 'time out' was performed. With the patient prone, the lower back was prepped with Betadine. 1% Lidocaine was used for local anesthesia. Lumbar puncture was performed at the L5-S1 level using a 20 gauge needle with return of clear CSF. 12 mg Of methotrexate was injected into the subarachnoid space. The patient tolerated the procedure well without apparent complication. FLUOROSCOPY TIME:  Fluoroscopy Time (in minutes and seconds): 0 minutes 40 seconds. Number of Acquired Images:  None. IMPRESSION: 1. Intrathecal injection of chemotherapy without complication. 2. Due to extremely slow flow, less than 1 cc CSF was collected for laboratory evaluation. Electronically Signed   By: Lorin Picket M.D.   On: 09/16/2015 16:18   Dg Fluoro Guide Lumbar Puncture  09/09/2015  CLINICAL DATA:  Burkitt's lymphoma with leptomeningeal involvement. Third dose intrathecal chemotherapy EXAM: FLUOROSCOPICALLY GUIDED LUMBAR PUNCTURE FOR INTRATHECAL CHEMOTHERAPY TECHNIQUE: Informed consent was obtained from the patient prior to the procedure, including potential complications of headache, allergy, and pain. A 'time out' was performed. With the patient prone, the lower back was prepped with Betadine. 1% Lidocaine was used for local anesthesia. Lumbar puncture was performed at the L5-S1 using a 22 gauge needle with return of clear CSF. 12 mg methotrexate and 50 mg hydrocortisone pharmacy prepared was injected into the subarachnoid space. The patient tolerated the procedure well without apparent complication. FLUOROSCOPY TIME:  1 minutes 11 seconds IMPRESSION: Intrathecal injection of chemotherapy without complication Electronically Signed   By:  Suzy Bouchard M.D.   On: 09/09/2015 11:08   Dg Fluoro Guide Lumbar Puncture  08/30/2015  CLINICAL DATA:  History of Burkitt's lymphoma. Diagnostic lumbar puncture with intrathecal chemotherapy injection. Subsequent encounter. EXAM: DIAGNOSTIC LUMBAR PUNCTURE UNDER FLUOROSCOPIC GUIDANCE FLUOROSCOPY TIME:  Radiation Exposure Index (as provided by the fluoroscopic device): If the device does not provide the exposure index: Fluoroscopy Time (in minutes and seconds):  44 seconds Number of Acquired Images:  0. PROCEDURE: Informed consent was obtained from the patient prior to the procedure, including potential complications of headache, allergy, and pain. With the patient prone, the lower back was prepped with Betadine. 1% Lidocaine was used for local anesthesia. Lumbar puncture was performed at the L5-S1 level using a 20 gauge needle with return of initially blood tinged but clearing. Fourteen ml of CSF were obtained for laboratory studies. Laboratory prepared methotrexate was injected into the subarachnoid space without difficulty. The patient tolerated the procedure well and there were no apparent complications. IMPRESSION: Diagnostic lumbar puncture and intrathecal injection of methotrexate without complication. Electronically Signed   By: Inge Rise M.D.   On: 08/30/2015 15:44   Dg Fluoro Guide Lumbar Puncture  08/26/2015  CLINICAL DATA:  Diagnostic lumbar puncture with intrathecal chemotherapy injection EXAM: FLUOROSCOPICALLY GUIDED LUMBAR PUNCTURE FOR INTRATHECAL CHEMOTHERAPY FLUOROSCOPY TIME:  dictate in minutes and seconds Radiation Exposure Index (as provided by the fluoroscopic device): Not available on this device If the device does not  provide the exposure index: Fluoroscopy Time (in minutes and seconds):  1 minutes 32 seconds Number of Acquired Images:  1 PROCEDURE: Informed consent was obtained from the patient prior to the procedure, including potential complications of headache, allergy,  and pain. With the patient prone, the lower back was prepped with Betadine. 1% Lidocaine was used for local anesthesia. Lumbar puncture was performed at the L3-4 level without success, likely due to ligamentous ossification. After numbing, attempt was then made at the L2-3 level, with dry tap despite good needle placement. After numbing, needle placed at the L5-S1 level with easy access to somewhat low pressure CSF (patient NPO for port placement this am). A 20 gauge needle was used. 11 cc of clear CSF were obtained for laboratory studies. The entire syringe of laboratory prepared methotrexate was then injected into the subarachnoid space. The patient tolerated the procedure well without apparent complication. IMPRESSION: Diagnostic lumbar puncture and intrathecal injection of chemotherapy without complication. Electronically Signed   By: Monte Fantasia M.D.   On: 08/26/2015 15:04    Discharge Laboratory Values: Component     Latest Ref Rng 09/18/2015 09/19/2015  WBC     4.0 - 10.5 K/uL  2.5 (L)  RBC     3.87 - 5.11 MIL/uL  3.23 (L)  Hemoglobin     12.0 - 15.0 g/dL 8.2 (L) 10.0 (L)  HCT     36.0 - 46.0 % 25.2 (L) 29.5 (L)  MCV     78.0 - 100.0 fL  91.3  MCH     26.0 - 34.0 pg  31.0  MCHC     30.0 - 36.0 g/dL  33.9  RDW     11.5 - 15.5 %  14.8  Platelets     150 - 400 K/uL  270  Neutrophils       69  NEUT#     1.7 - 7.7 K/uL  1.7  Lymphocytes       30  Lymphocyte #     0.7 - 4.0 K/uL  0.7  Monocytes Relative       1  Monocyte #     0.1 - 1.0 K/uL  0.0 (L)  Eosinophil       0  Eosinophils Absolute     0.0 - 0.7 K/uL  0.0  Basophil       0  Basophils Absolute     0.0 - 0.1 K/uL  0.0  Sodium     135 - 145 mmol/L  137  Potassium     3.5 - 5.1 mmol/L  3.8  Chloride     101 - 111 mmol/L  102  CO2     22 - 32 mmol/L  30  Glucose     65 - 99 mg/dL  97  BUN     6 - 20 mg/dL  19  Creatinine     0.44 - 1.00 mg/dL  0.58  Calcium     8.9 - 10.3 mg/dL  8.2 (L)  Total  Protein     6.5 - 8.1 g/dL  4.9 (L)  Albumin     3.5 - 5.0 g/dL  2.6 (L)  AST     15 - 41 U/L  14 (L)  ALT     14 - 54 U/L  19  Alkaline Phosphatase     38 - 126 U/L  67  Total Bilirubin     0.3 - 1.2 mg/dL  0.7  EGFR (Non-African Amer.)     >  60 mL/min  >60  EGFR (African American)     >60 mL/min  >60  Anion gap     5 - 15  5  Tube #         Color, CSF     COLORLESS    Appearance, CSF     CLEAR    Supernatant         RBC Count, CSF     0 /cu mm    WBC, CSF     0 - 5 /cu mm    Other Cells, CSF         Uric Acid, Serum     2.3 - 6.6 mg/dL  3.4  Glucose, CSF     40 - 70 mg/dL    Total  Protein, CSF     15 - 45 mg/dL     Component     Latest Ref Rng 09/20/2015  WBC     4.0 - 10.5 K/uL 2.4 (L)  RBC     3.87 - 5.11 MIL/uL 2.98 (L)  Hemoglobin     12.0 - 15.0 g/dL 9.2 (L)  HCT     36.0 - 46.0 % 27.1 (L)  MCV     78.0 - 100.0 fL 90.9  MCH     26.0 - 34.0 pg 30.9  MCHC     30.0 - 36.0 g/dL 33.9  RDW     11.5 - 15.5 % 14.3  Platelets     150 - 400 K/uL 257  Neutrophils      73  NEUT#     1.7 - 7.7 K/uL 1.7  Lymphocytes      27  Lymphocyte #     0.7 - 4.0 K/uL 0.6 (L)  Monocytes Relative      0  Monocyte #     0.1 - 1.0 K/uL 0.0 (L)  Eosinophil      0  Eosinophils Absolute     0.0 - 0.7 K/uL 0.0  Basophil      0  Basophils Absolute     0.0 - 0.1 K/uL 0.0  Sodium     135 - 145 mmol/L 137  Potassium     3.5 - 5.1 mmol/L 3.9  Chloride     101 - 111 mmol/L 103  CO2     22 - 32 mmol/L 29  Glucose     65 - 99 mg/dL 101 (H)  BUN     6 - 20 mg/dL 17  Creatinine     0.44 - 1.00 mg/dL 0.63  Calcium     8.9 - 10.3 mg/dL 8.1 (L)  Total Protein     6.5 - 8.1 g/dL 4.5 (L)  Albumin     3.5 - 5.0 g/dL 2.5 (L)  AST     15 - 41 U/L 13 (L)  ALT     14 - 54 U/L 17  Alkaline Phosphatase     38 - 126 U/L 59  Total Bilirubin     0.3 - 1.2 mg/dL 0.7  EGFR (Non-African Amer.)     >60 mL/min >60  EGFR (African American)     >60 mL/min >60    Anion gap     5 - 15 5  Tube #      1  Color, CSF     COLORLESS RED (A)  Appearance, CSF     CLEAR CLOUDY (A)  Supernatant      COLORLESS  RBC Count,  CSF     0 /cu mm 7200 (H)  WBC, CSF     0 - 5 /cu mm 2  Other Cells, CSF      TOO FEW TO COUNT, SMEAR AVAILABLE FOR REVIEW  Uric Acid, Serum     2.3 - 6.6 mg/dL 3.1  Glucose, CSF     40 - 70 mg/dL 63  Total  Protein, CSF     15 - 45 mg/dL 38    Brief H and P: For complete details please refer to admission H and P, but in brief,  Ms Truda Staub is a 81 year old Caucasian female with 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 Her CSF cytology was negative though her MRI showed diffuse leptomeningeal enhancement. She was admitted for her 2nd cycle of daEPOCH-R which was dose reduced where day 4 of infusional doxorubicin/etoposide/vincristine was held and cyclophosphamide dose was also reduced. Patient also received day 1 and Day 5 IT methotrexate +Hydrocortisone for management of her leptomeningeal disease. She receive PRBC transfusion for symptomatic anemia. She was discharged home in stable condition.  Patient is to f/u for her Neulasta shot on 11/14 and next dose of IT methotrexate and clinic followup with labs on 09/30/2015.  Physical Exam at Discharge: BP 123/50 mmHg  Pulse 57  Temp(Src) 98.2 F (36.8 C) (Oral)  Resp 20  Ht _0  (1.676 m)  Wt 164 lb 8 oz (74.617 kg)  BMI 26.56 kg/m2  SpO2 96% GENERAL: Elderly lady in no acute distress SKIN: skin color, texture, turgor are normal, no rashes or significant lesions EYES: normal, conjunctiva are pink and non-injected, sclera clear, baseline strabismus. OROPHARYNX:no exudate, no erythema and lips, buccal mucosa, and tongue normal  NECK: supple, no JVD, thyroid normal size, non-tender, without nodularity LYMPH: no palpable lymphadenopathy in the cervical, axillary or inguinal LUNGS: clear to auscultation with normal respiratory effort HEART:  regular rate & rhythm, no murmurs and no lower extremity edema ABDOMEN: abdomen soft, non-tender, normoactive bowel sounds  Musculoskeletal: no cyanosis of digits and no clubbing  PSYCH: alert & oriented x 3 with fluent speech NEURO: no focal motor/sensory deficits   Hospital Course:  Active Problems:   Burkitt lymphoma ([place])   Leptomeningeal disease   Encounter for antineoplastic chemotherapy   Anemia due to other cause   Diet:  Regular diet   Activity:  Up as tolerated  Condition at Discharge:  Stable with guarded prognosis   Signed: Dr. Sullivan Lone MD [place] [phone]  09/20/2015, 1:23 PM  Time spent discharging patient >87mns

## 2015-09-20 NOTE — Progress Notes (Signed)
BSA, Dosage, and Dilution of intrathecal MTX verified with 2nd RN Reyne Dumas.

## 2015-09-21 NOTE — Care Management Note (Signed)
Case Management Note  Patient Details  Name: Kristen Baker MRN: [id number] Date of Birth: 1933-03-02               Action/Plan: Call received from [facility] that resumption of care orders needed. Per Consult to Care Mgmt placed by attending. Requested CM reestablish HH with [facility]. Orders placed. Notified [facility].   Expected Discharge Date:  09/20/2015               Expected Discharge Plan:  [place]  In-House Referral:     Discharge planning Services  CM Consult  Post Acute Care Choice:  Resumption of Svcs/PTA Provider Choice offered to:      HH Arranged:  RN, PT, Nurse's Aide [place] Agency:  [place]  Status of Service:  Completed, signed off  Medicare Important Message Given:  Yes Date Medicare IM Given:    Medicare IM give by:    Date Additional Medicare IM Given:    Additional Medicare Important Message give by:     If discussed at [place] of Stay Meetings, dates discussed:    Additional Comments:  Erenest Rasher, RN 09/21/2015, 3:54 PM

## 2015-09-23 ENCOUNTER — Ambulatory Visit (HOSPITAL_BASED_OUTPATIENT_CLINIC_OR_DEPARTMENT_OTHER): Payer: Medicare Other

## 2015-09-23 VITALS — BP 113/51 | HR 81 | Temp 98.4°F

## 2015-09-23 DIAGNOSIS — Z5189 Encounter for other specified aftercare: Secondary | ICD-10-CM

## 2015-09-23 DIAGNOSIS — C8378 Burkitt lymphoma, lymph nodes of multiple sites: Secondary | ICD-10-CM

## 2015-09-23 MED ORDER — PEGFILGRASTIM INJECTION 6 MG/0.6ML ~~LOC~~
6.0000 mg | PREFILLED_SYRINGE | Freq: Once | SUBCUTANEOUS | Status: AC
  Administered 2015-09-23: 6 mg via SUBCUTANEOUS
  Filled 2015-09-23: qty 0.6

## 2015-09-25 ENCOUNTER — Other Ambulatory Visit: Payer: Self-pay | Admitting: *Deleted

## 2015-09-25 ENCOUNTER — Ambulatory Visit (HOSPITAL_BASED_OUTPATIENT_CLINIC_OR_DEPARTMENT_OTHER): Payer: Medicare Other

## 2015-09-25 ENCOUNTER — Inpatient Hospital Stay (HOSPITAL_COMMUNITY)
Admission: AD | Admit: 2015-09-25 | Discharge: 2015-09-30 | DRG: 064 | Disposition: A | Discharge status: Skilled Nursing Facility | Payer: Medicare Other | Source: Ambulatory Visit | Attending: Internal Medicine | Admitting: Internal Medicine

## 2015-09-25 ENCOUNTER — Ambulatory Visit (HOSPITAL_BASED_OUTPATIENT_CLINIC_OR_DEPARTMENT_OTHER): Payer: Medicare Other | Admitting: Hematology

## 2015-09-25 ENCOUNTER — Encounter (HOSPITAL_COMMUNITY): Payer: Self-pay | Admitting: Internal Medicine

## 2015-09-25 ENCOUNTER — Telehealth: Payer: Self-pay | Admitting: *Deleted

## 2015-09-25 ENCOUNTER — Encounter: Payer: Self-pay | Admitting: Hematology

## 2015-09-25 ENCOUNTER — Ambulatory Visit (HOSPITAL_COMMUNITY)
Admission: RE | Admit: 2015-09-25 | Discharge: 2015-09-25 | Disposition: A | Discharge status: Home / Self Care | Payer: Medicare Other | Source: Ambulatory Visit | Attending: Hematology | Admitting: Hematology

## 2015-09-25 VITALS — BP 151/54 | HR 87 | Temp 98.4°F | Resp 16

## 2015-09-25 DIAGNOSIS — G44031 Episodic paroxysmal hemicrania, intractable: Secondary | ICD-10-CM

## 2015-09-25 DIAGNOSIS — C8378 Burkitt lymphoma, lymph nodes of multiple sites: Secondary | ICD-10-CM | POA: Diagnosis not present

## 2015-09-25 DIAGNOSIS — Z9221 Personal history of antineoplastic chemotherapy: Secondary | ICD-10-CM

## 2015-09-25 DIAGNOSIS — C7932 Secondary malignant neoplasm of cerebral meninges: Secondary | ICD-10-CM | POA: Diagnosis present

## 2015-09-25 DIAGNOSIS — Z91018 Allergy to other foods: Secondary | ICD-10-CM

## 2015-09-25 DIAGNOSIS — C8370 Burkitt lymphoma, unspecified site: Secondary | ICD-10-CM

## 2015-09-25 DIAGNOSIS — E871 Hypo-osmolality and hyponatremia: Secondary | ICD-10-CM

## 2015-09-25 DIAGNOSIS — Z91013 Allergy to seafood: Secondary | ICD-10-CM

## 2015-09-25 DIAGNOSIS — Z79891 Long term (current) use of opiate analgesic: Secondary | ICD-10-CM

## 2015-09-25 DIAGNOSIS — I890 Lymphedema, not elsewhere classified: Secondary | ICD-10-CM | POA: Diagnosis present

## 2015-09-25 DIAGNOSIS — D702 Other drug-induced agranulocytosis: Secondary | ICD-10-CM | POA: Diagnosis not present

## 2015-09-25 DIAGNOSIS — T451X5A Adverse effect of antineoplastic and immunosuppressive drugs, initial encounter: Secondary | ICD-10-CM | POA: Diagnosis present

## 2015-09-25 DIAGNOSIS — D61810 Antineoplastic chemotherapy induced pancytopenia: Secondary | ICD-10-CM | POA: Diagnosis present

## 2015-09-25 DIAGNOSIS — I6789 Other cerebrovascular disease: Secondary | ICD-10-CM | POA: Diagnosis not present

## 2015-09-25 DIAGNOSIS — Z95828 Presence of other vascular implants and grafts: Secondary | ICD-10-CM

## 2015-09-25 DIAGNOSIS — R531 Weakness: Secondary | ICD-10-CM | POA: Diagnosis not present

## 2015-09-25 DIAGNOSIS — R51 Headache: Secondary | ICD-10-CM | POA: Diagnosis present

## 2015-09-25 DIAGNOSIS — Z888 Allergy status to other drugs, medicaments and biological substances status: Secondary | ICD-10-CM

## 2015-09-25 DIAGNOSIS — Z79899 Other long term (current) drug therapy: Secondary | ICD-10-CM | POA: Diagnosis not present

## 2015-09-25 DIAGNOSIS — D649 Anemia, unspecified: Secondary | ICD-10-CM | POA: Insufficient documentation

## 2015-09-25 DIAGNOSIS — R519 Headache, unspecified: Secondary | ICD-10-CM

## 2015-09-25 DIAGNOSIS — Z8541 Personal history of malignant neoplasm of cervix uteri: Secondary | ICD-10-CM | POA: Diagnosis not present

## 2015-09-25 DIAGNOSIS — G44019 Episodic cluster headache, not intractable: Secondary | ICD-10-CM | POA: Diagnosis not present

## 2015-09-25 DIAGNOSIS — E860 Dehydration: Secondary | ICD-10-CM

## 2015-09-25 DIAGNOSIS — I63133 Cerebral infarction due to embolism of bilateral carotid arteries: Secondary | ICD-10-CM | POA: Diagnosis not present

## 2015-09-25 DIAGNOSIS — D701 Agranulocytosis secondary to cancer chemotherapy: Secondary | ICD-10-CM | POA: Diagnosis not present

## 2015-09-25 DIAGNOSIS — R112 Nausea with vomiting, unspecified: Secondary | ICD-10-CM

## 2015-09-25 DIAGNOSIS — D72819 Decreased white blood cell count, unspecified: Secondary | ICD-10-CM

## 2015-09-25 DIAGNOSIS — Z882 Allergy status to sulfonamides status: Secondary | ICD-10-CM

## 2015-09-25 DIAGNOSIS — G44021 Chronic cluster headache, intractable: Secondary | ICD-10-CM | POA: Diagnosis not present

## 2015-09-25 DIAGNOSIS — G960 Cerebrospinal fluid leak: Secondary | ICD-10-CM | POA: Diagnosis present

## 2015-09-25 DIAGNOSIS — D6489 Other specified anemias: Secondary | ICD-10-CM

## 2015-09-25 DIAGNOSIS — I639 Cerebral infarction, unspecified: Secondary | ICD-10-CM | POA: Diagnosis present

## 2015-09-25 DIAGNOSIS — C8371 Burkitt lymphoma, lymph nodes of head, face, and neck: Secondary | ICD-10-CM | POA: Insufficient documentation

## 2015-09-25 DIAGNOSIS — K5909 Other constipation: Secondary | ICD-10-CM

## 2015-09-25 DIAGNOSIS — I6330 Cerebral infarction due to thrombosis of unspecified cerebral artery: Secondary | ICD-10-CM | POA: Diagnosis not present

## 2015-09-25 LAB — SAMPLE TO BLOOD BANK

## 2015-09-25 LAB — GLUCOSE, CAPILLARY: GLUCOSE-CAPILLARY: 195 mg/dL — AB (ref 65–99)

## 2015-09-25 LAB — COMPREHENSIVE METABOLIC PANEL (CC13)
ALBUMIN: 2.7 g/dL — AB (ref 3.5–5.0)
ALK PHOS: 75 U/L (ref 40–150)
ALT: 15 U/L (ref 0–55)
ANION GAP: 7 meq/L (ref 3–11)
AST: <7 U/L (ref 5–34)
BILIRUBIN TOTAL: 0.49 mg/dL (ref 0.20–1.20)
BUN: 13.3 mg/dL (ref 7.0–26.0)
CO2: 26 mEq/L (ref 22–29)
Calcium: 8.3 mg/dL — ABNORMAL LOW (ref 8.4–10.4)
Chloride: 98 mEq/L (ref 98–109)
Creatinine: 0.6 mg/dL (ref 0.6–1.1)
EGFR: 86 mL/min/{1.73_m2} — AB (ref >90)
Glucose: 156 mg/dl — ABNORMAL HIGH (ref 70–140)
Potassium: 4.2 mEq/L (ref 3.5–5.1)
Sodium: 131 mEq/L — ABNORMAL LOW (ref 136–145)
TOTAL PROTEIN: 5.1 g/dL — AB (ref 6.4–8.3)

## 2015-09-25 LAB — CBC WITH DIFFERENTIAL/PLATELET
HEMATOCRIT: 29 % — AB (ref 34.8–46.6)
HEMOGLOBIN: 9.8 g/dL — AB (ref 11.6–15.9)
MCH: 30.4 pg (ref 25.1–34.0)
MCHC: 33.8 g/dL (ref 31.5–36.0)
MCV: 90.1 fL (ref 79.5–101.0)
PLATELETS: 117 10*3/uL — AB (ref 145–400)
RBC: 3.22 10*6/uL — ABNORMAL LOW (ref 3.70–5.45)
RDW: 13.5 % (ref 11.2–14.5)
WBC: 0.4 10*3/uL — CL (ref 3.9–10.3)

## 2015-09-25 LAB — MANUAL DIFFERENTIAL
ALC: 0.3 10*3/uL — AB (ref 0.9–3.3)
ANC (CHCC MAN DIFF): 0 10*3/uL — AB (ref 1.5–6.5)
Band Neutrophils: 0 % (ref 0–10)
Basophil: 3 % — ABNORMAL HIGH (ref 0–2)
Blasts: 0 % (ref 0–0)
EOS: 4 % (ref 0–7)
LYMPH: 85 % — ABNORMAL HIGH (ref 14–49)
MONO: 4 % (ref 0–14)
MYELOCYTES: 0 % (ref 0–0)
Metamyelocytes: 0 % (ref 0–0)
Other Cell: 0 % (ref 0–0)
PLT EST: DECREASED
PROMYELO: 0 % (ref 0–0)
SEG: 4 % — AB (ref 38–77)
Variant Lymph: 0 % (ref 0–0)
nRBC: 0 % (ref 0–0)

## 2015-09-25 LAB — LACTATE DEHYDROGENASE (CC13): LDH: 181 U/L (ref 125–245)

## 2015-09-25 LAB — HOLD TUBE, BLOOD BANK

## 2015-09-25 LAB — MAGNESIUM: MAGNESIUM: 1.7 mg/dL (ref 1.7–2.4)

## 2015-09-25 MED ORDER — PANTOPRAZOLE SODIUM 40 MG PO TBEC
40.0000 mg | DELAYED_RELEASE_TABLET | Freq: Every day | ORAL | Status: DC
  Administered 2015-09-26 – 2015-09-30 (×5): 40 mg via ORAL
  Filled 2015-09-25 (×5): qty 1

## 2015-09-25 MED ORDER — SENNA 8.6 MG PO TABS
2.0000 | ORAL_TABLET | Freq: Every day | ORAL | Status: DC
  Administered 2015-09-28: 17.2 mg via ORAL
  Filled 2015-09-25: qty 2

## 2015-09-25 MED ORDER — DEXTROSE-NACL 5-0.9 % IV SOLN
INTRAVENOUS | Status: AC
  Administered 2015-09-25: 16:00:00 via INTRAVENOUS

## 2015-09-25 MED ORDER — PROMETHAZINE HCL 25 MG PO TABS: 25.0000 mg | ORAL_TABLET | Freq: Four times a day (QID) | ORAL | Status: DC | PRN

## 2015-09-25 MED ORDER — PROMETHAZINE HCL 25 MG/ML IJ SOLN: 12.5000 mg | Freq: Four times a day (QID) | INTRAMUSCULAR | Status: DC | PRN

## 2015-09-25 MED ORDER — POLYETHYLENE GLYCOL 3350 17 G PO PACK
17.0000 g | PACK | Freq: Every day | ORAL | Status: DC
  Administered 2015-09-28: 17 g via ORAL
  Filled 2015-09-25 (×2): qty 1

## 2015-09-25 MED ORDER — SODIUM CHLORIDE 0.9 % IJ SOLN
10.0000 mL | INTRAMUSCULAR | Status: DC | PRN
  Administered 2015-09-25: 10 mL via INTRAVENOUS
  Filled 2015-09-25: qty 10

## 2015-09-25 MED ORDER — SODIUM CHLORIDE 0.9 % IV SOLN
Freq: Once | INTRAVENOUS | Status: AC
  Administered 2015-09-25: 16:00:00 via INTRAVENOUS
  Filled 2015-09-25: qty 5

## 2015-09-25 MED ORDER — TRAMADOL HCL 50 MG PO TABS
50.0000 mg | ORAL_TABLET | Freq: Four times a day (QID) | ORAL | Status: DC | PRN
  Administered 2015-09-26: 50 mg via ORAL
  Filled 2015-09-25: qty 1

## 2015-09-25 MED ORDER — HEPARIN SOD (PORK) LOCK FLUSH 100 UNIT/ML IV SOLN
500.0000 [IU] | Freq: Once | INTRAVENOUS | Status: DC
  Filled 2015-09-25: qty 5

## 2015-09-25 MED ORDER — DEXTROSE-NACL 5-0.45 % IV SOLN: INTRAVENOUS | Status: DC

## 2015-09-25 MED ORDER — PROCHLORPERAZINE MALEATE 10 MG PO TABS: 10.0000 mg | ORAL_TABLET | Freq: Four times a day (QID) | ORAL | Status: DC | PRN

## 2015-09-25 MED ORDER — MORPHINE SULFATE (PF) 2 MG/ML IV SOLN
2.0000 mg | INTRAVENOUS | Status: DC | PRN
  Filled 2015-09-25: qty 1

## 2015-09-25 MED ORDER — PROMETHAZINE HCL 25 MG RE SUPP: 12.5000 mg | Freq: Four times a day (QID) | RECTAL | Status: DC | PRN

## 2015-09-25 MED ORDER — INSULIN ASPART 100 UNIT/ML ~~LOC~~ SOLN
0.0000 [IU] | Freq: Three times a day (TID) | SUBCUTANEOUS | Status: DC
  Administered 2015-09-26: 2 [IU] via SUBCUTANEOUS
  Administered 2015-09-26 – 2015-09-30 (×4): 1 [IU] via SUBCUTANEOUS

## 2015-09-25 NOTE — Progress Notes (Signed)
[name] [facility]  HEMATOLOGY ONCOLOGY PROGRESS NOTE  Date of service: .  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 Da EPOCH-R s/p 2 cycles. 2. IT methotrexate + Hydrocortisone qweekly s/p 5 doses   INTERVAL HISTORY:  Kristen Baker is here for early followup following her recent discharge from the hospital on Friday 111/09/2015.  She got her Neulasta shot on 09/23/2015.  She is here for evaluation of persistent nausea and vomiting since yesterday, generalized weakness and poor oral intake.  She also notes some orthostatic headaches.  Patient notes she was feeling very weak.  She received IV fluids and one dose of Emend+dexamethasone in the clinic with improvement in her nausea. Headaches are nearly resolved when she lays flat but bothersome when she sits up which brings up a concern of possible CSF leak. No fevers or chills.  Her vitals were noted at home on Monday and today by her home nurse and noted to be stable with no fevers.  She is here with her daughter.  I discussed her case with Dr. Broadus John a hospitalist and admitted her for further evaluation and management of her persistent nausea,  Headache and dehydration.  REVIEW OF SYSTEMS:    10 Point review of systems of done and is negative except as noted above.  . Past Medical History  Diagnosis Date  . Cancer (HCC)     cervical cancer  . Burkitt's lymphoma ([place]) 08/29/2015    . Past Surgical History  Procedure Laterality Date  . Cholecystectomy    . Tonsillectomy    . Abdominal hysterectomy    . Abdominal surgery      2-3 rd of colon removed    . Social History  Substance Use Topics  . Smoking status: Never Smoker   . Smokeless tobacco: Never Used  . Alcohol Use: No    ALLERGIES:  is allergic to cabbage; codeine; onion; shellfish allergy; sulfa antibiotics; and zofran.  MEDICATIONS:  Reviewed in  EPIC.  PHYSICAL EXAMINATION: ECOG PERFORMANCE STATUS: 2 - Symptomatic, <50% confined to bed  . Filed Vitals:   09/25/15 1601  BP: 151/54  Pulse: 87  Temp: 98.4 F (36.9 C)  Resp: 16    There were no vitals filed for this visit.visit  Part  .There is no weight on file to calculate BMI.  GENERAL:alert,elderly lady- appears weak and was uncomfortable initially due to significant nausea. SKIN: no acute skin rashes EYES: normal, conjunctiva are pink and non-injected, sclera clear OROPHARYNX:no exudate, no erythema and lips,oral mucosa is somewhat dry. NECK: supple, no JVD, thyroid normal size, non-tender, without nodularity LYMPH:  no palpable lymphadenopathy in the cervical, axillary or inguinal LUNGS: clear to auscultation with normal respiratory effort HEART: regular rate & rhythm,  no murmurs and no lower extremity edema ABDOMEN: abdomen soft, non-tender, normoactive bowel sounds  Musculoskeletal: no cyanosis of digits and no clubbing  PSYCH: alert & oriented x 3 with fluent speech NEURO: no focal motor/sensory deficits  LABORATORY DATA:   . CBC Latest Ref Rng 09/25/2015 09/20/2015 09/19/2015  WBC 3.9 - 10.3 10e3/uL 0.4(LL) 2.4(L) 2.5(L)  Hemoglobin 11.6 - 15.9 g/dL 9.8(L) 9.2(L) 10.0(L)  Hematocrit 34.8 - 46.6 % 29.0(L) 27.1(L) 29.5(L)  Platelets 145 - 400 10e3/uL 117(L) 257 270    . CMP Latest Ref Rng 09/25/2015 09/20/2015 09/19/2015  Glucose 70 - 140 mg/dl 156(H) 101(H) 97  BUN 7.0 - 26.0 mg/dL  13.3 17 19   Creatinine 0.6 - 1.1 mg/dL 0.6 0.63 0.58  Sodium 136 - 145 mEq/L 131(L) 137 137  Potassium 3.5 - 5.1 mEq/L 4.2 3.9 3.8  Chloride 101 - 111 mmol/L - 103 102  CO2 22 - 29 mEq/L 26 29 30   Calcium 8.4 - 10.4 mg/dL 8.3(L) 8.1(L) 8.2(L)  Total Protein 6.4 - 8.3 g/dL 5.1(L) 4.5(L) 4.9(L)  Total Bilirubin 0.20 - 1.20 mg/dL 0.49 0.7 0.7  Alkaline Phos 40 - 150 U/L 75 59 67  AST 5 - 34 U/L <7 13(L) 14(L)  ALT 0 - 55 U/L 15 17 19    . Lab Results  Component Value  Date   LDH 181 09/25/2015   RADIOGRAPHIC STUDIES: I have personally reviewed the radiological images as listed and agreed with the findings in the report. Dg Chest 2 View  09/06/2015  CLINICAL DATA:  Onset of weakness last night, generalized weakness throughout body, recently diagnosed with cervical cancer with first chemotherapy treatment last week, neutropenia, a intermittent midline lower abdominal pain EXAM: CHEST  2 VIEW COMPARISON:  08/18/2015 FINDINGS: RIGHT jugular Port-A-Cath with tip projecting over SVC. Normal heart size, mediastinal contours and pulmonary vascularity. Atherosclerotic calcification aorta. Lungs clear. No pleural effusion or pneumothorax. Bones demineralized. IMPRESSION: No acute abnormalities. Electronically Signed   By: Lavonia Dana M.D.   On: 09/06/2015 14:17   Nm Pet Image Initial (pi) Skull Base To Thigh  08/27/2015  CLINICAL DATA:  Initial treatment strategy for lymphoma. EXAM: NUCLEAR MEDICINE PET VERTEX TO THIGH TECHNIQUE: 8.75 mCi F-18 FDG was injected intravenously. Full-ring PET imaging was performed from the vertex to thigh after the radiotracer. CT data was obtained and used for attenuation correction and anatomic localization. FASTING BLOOD GLUCOSE:  Value: 126 mg/dl COMPARISON:  08/19/2015 FINDINGS: HEAD AND NECK No abnormal radiotracer accumulation identified within the brain. A small amount of gas is noted within the anterior horn of the left lateral ventricle. No hypermetabolic lymph nodes in the neck. CHEST Hypermetabolic posterior mediastinal soft tissue at the level of the thigh for ppm attic hiatus is identified. This measures 2 x 2.5 cm and has an SUV max equal to 6.9. No enlarged or hypermetabolic mediastinal or hilar lymph nodes. No hypermetabolic axillary or supraclavicular adenopathy. No suspicious pulmonary nodules identified on the CT images. Calcified granuloma is identified within the posterior left upper lobe. ABDOMEN/PELVIS No abnormal  hypermetabolic activity within the liver, pancreas, adrenal glands, or spleen. Prominent retroperitoneal lymph nodes are identified. Index pre caval node just above the aortic bifurcation measures 1.3 cm and has an SUV max equal to 3.50. Increased presacral soft tissue is identified which exhibits mild increased uptake. This measures 1.x 5.1 x 4.0 cm. The SUV max within this area is equal to 4.13. SKELETON Diffuse heterogeneous tracer activity is identified throughout the bone marrow of the axial and proximal appendicular skeleton. More focal areas of intense uptake are noted within the thoracic spine, lumbar spine and pelvis. For example cuff within the L3 vertebra there is an area of increased uptake which has an SUV max equal to 6.38. IMPRESSION: 1. Abnormal soft tissue within the posterior mediastinum exhibits malignant range FDG uptake and is compatible with clinical history of lymphoma. 2. There are a few enlarged retroperitoneal lymph nodes within the lower abdomen which exhibits nonspecific FDG uptake. Additionally, there is mild increased presacral soft tissue exhibiting low level FDG uptake. 3. Diffuse heterogeneous uptake throughout the bone marrow is noted and is worrisome for lymphomatous involvement. Electronically  Signed   By: Kerby Moors M.D.   On: 08/27/2015 11:50   Dg Fluoro Guide Lumbar Puncture  09/20/2015  CLINICAL DATA:  81 year old female with lymphoma. Repeat intrathecal chemotherapy injection. Subsequent encounter. EXAM: DIAGNOSTIC LUMBAR PUNCTURE UNDER FLUOROSCOPIC GUIDANCE FLUOROSCOPY TIME:  Radiation Exposure Index (as provided by the fluoroscopic device): If the device does not provide the exposure index: Fluoroscopy Time (in minutes and seconds):  0 minutes 30 seconds Number of Acquired Images:  4 PROCEDURE: Informed consent was obtained from the patient prior to the procedure, including potential complications of headache, allergy, and pain. A "time-out" was performed. The  chemotherapy pack age from the pharmacy was confirmed with the patient name and date of birth. With the patient prone, the lower back was prepped with Betadine. 1% Lidocaine was used for local anesthesia. Lumbar puncture was performed at the L2-L3 level using right sub laminar approach, a 3.5 inch x 20 gauge needle. Initially when the stylet was removed blood was returned. The needle was repositioned, but blood again was returned. At this point a cross-table lateral view was obtained and demonstrated the tip of the needle within the posterior L3 vertebral body. The needle was then withdrawn 1 cm and slightly blood-tinged and cloudy CSF was returned. 4.5 ml of CSF were obtained for laboratory studies. The patient tolerated the procedure well and there were no apparent complications. Several mL of CSF were then held within the tubing to use as flush. The chemotherapy needle was connected to the unused hub of the 3 way stopcock, and the chemotherapy was slowly injected. This was followed by the CSF flush, and then the stylet was reintroduced to the needle. The needle was withdrawn, direct pressure was held, and hemostasis was noted. Appropriate post procedural orders were placed on the chart. The patient was transported to the ward in stable condition for continued treatment. IMPRESSION: Fluoroscopic guided lumbar puncture with intrathecal injection of chemotherapy at L2-L3. 4.5 mL of slightly blood-tinged, cloudy CSF obtained for laboratory studies prior to injection. Electronically Signed   By: Genevie Ann M.D.   On: 09/20/2015 13:50   Dg Fluoro Guide Lumbar Puncture  09/16/2015  CLINICAL DATA:  Lymphoma, methotrexate injection. EXAM: FLUOROSCOPICALLY GUIDED LUMBAR PUNCTURE FOR INTRATHECAL CHEMOTHERAPY TECHNIQUE: Informed consent was obtained from the patient prior to the procedure, including potential complications of headache, allergy, and pain. A 'time out' was performed. With the patient prone, the lower back was  prepped with Betadine. 1% Lidocaine was used for local anesthesia. Lumbar puncture was performed at the L5-S1 level using a 20 gauge needle with return of clear CSF. 12 mg Of methotrexate was injected into the subarachnoid space. The patient tolerated the procedure well without apparent complication. FLUOROSCOPY TIME:  Fluoroscopy Time (in minutes and seconds): 0 minutes 40 seconds. Number of Acquired Images:  None. IMPRESSION: 1. Intrathecal injection of chemotherapy without complication. 2. Due to extremely slow flow, less than 1 cc CSF was collected for laboratory evaluation. Electronically Signed   By: Lorin Picket M.D.   On: 09/16/2015 16:18   Dg Fluoro Guide Lumbar Puncture  09/09/2015  CLINICAL DATA:  Burkitt's lymphoma with leptomeningeal involvement. Third dose intrathecal chemotherapy EXAM: FLUOROSCOPICALLY GUIDED LUMBAR PUNCTURE FOR INTRATHECAL CHEMOTHERAPY TECHNIQUE: Informed consent was obtained from the patient prior to the procedure, including potential complications of headache, allergy, and pain. A 'time out' was performed. With the patient prone, the lower back was prepped with Betadine. 1% Lidocaine was used for local anesthesia. Lumbar puncture was performed  at the L5-S1 using a 22 gauge needle with return of clear CSF. 12 mg methotrexate and 50 mg hydrocortisone pharmacy prepared was injected into the subarachnoid space. The patient tolerated the procedure well without apparent complication. FLUOROSCOPY TIME:  1 minutes 11 seconds IMPRESSION: Intrathecal injection of chemotherapy without complication Electronically Signed   By: Suzy Bouchard M.D.   On: 09/09/2015 11:08   Dg Fluoro Guide Lumbar Puncture  08/30/2015  CLINICAL DATA:  History of Burkitt's lymphoma. Diagnostic lumbar puncture with intrathecal chemotherapy injection. Subsequent encounter. EXAM: DIAGNOSTIC LUMBAR PUNCTURE UNDER FLUOROSCOPIC GUIDANCE FLUOROSCOPY TIME:  Radiation Exposure Index (as provided by the  fluoroscopic device): If the device does not provide the exposure index: Fluoroscopy Time (in minutes and seconds):  44 seconds Number of Acquired Images:  0. PROCEDURE: Informed consent was obtained from the patient prior to the procedure, including potential complications of headache, allergy, and pain. With the patient prone, the lower back was prepped with Betadine. 1% Lidocaine was used for local anesthesia. Lumbar puncture was performed at the L5-S1 level using a 20 gauge needle with return of initially blood tinged but clearing. Fourteen ml of CSF were obtained for laboratory studies. Laboratory prepared methotrexate was injected into the subarachnoid space without difficulty. The patient tolerated the procedure well and there were no apparent complications. IMPRESSION: Diagnostic lumbar puncture and intrathecal injection of methotrexate without complication. Electronically Signed   By: Inge Rise M.D.   On: 08/30/2015 15:44    ASSESSMENT & PLAN:   81 yo caucasian female with   1)Stage IVb Burkitts lymphoma with leptomeningeal involvement. On presentation patient had Retroperitoneal and Posterior Mediastinal Lnadenopathy with significantly elevated LDH and leukoerythroblastic picture on peipheral blood smear with multiple large atypical Lymphocytes. Patient had significant type B constitutional symptoms which have now resolved . MRI Brain with evidence of leptomeningeal involvement consistent with the patients symptoms of chin numbness though LP was unrevealing. BM packed with lymphoma LDH down from 3000's to 1900 to 1215 to 500's to 368 to 352 to 311 to 243 to 181 (today) S/p 2 cycles of daEPOCH-R + IT Methotrexate (weekly for now - received 5rd dose on 09/20/2015)  2) pancytopenia with significant neutropenia likely related to her chemotherapy.  Patient is likely at the nadir of her counts at this time.  She has received Neulasta on 09/23/2015 and we expect her WBC counts to bounce back  soon. No fevers or chills at this time.  3)significant fatigue likely from chemotherapy.  4) dehydration  5) significant nausea and vomiting since yesterday along with orthostatic headaches and neck pain.  Could be from possible CSF leak from her recent intrathecal chemotherapy.  No overt fevers to suggest an infection or meningitis at this time.  Headaches and neck pains are significantly improved with laying flat in bed which is in favor of CSF leak.  Rule out worsening CNS disease though less likely.  No other overt focal neurological deficits.  6)hhyponatremia likely related to dehydration and poor solute intake. Plan -we'll admit the patient to the hospital for control of her nausea and further evaluation and management of headaches and neck pain. -Patient received D5NS IV fluids in the clinic. -Was given Emend and dexamethasone for control of nausea with significant improvement in her nausea. -Bedrest -Continue IV fluids -when necessary Compazine or Phenergan for nausea. -We'll get an MRI of the brain with and without contrast as discussed with Dr. Broadus John. -If any fevers are noted would recommend broad spectrum antibiotic coverage for  collar for neutropenic fever and also  Cover for meningeal source of infection. -If headaches are persistent and positional might need to consider an epidural blood patch. -We will likely hold her next intrathecal methotrexate plus hydrocortisone dose scheduled for 09/30/2015. -dietary consultation to optimize oral intake. -we'll continue to follow daily. -CBC with differential and CMP daily.   I spent 25 minutes counseling the patient face to face. The total time spent in the appointment was 30 minutes and more than 50% was on counseling and direct patient cares and coordination of care with hospitalist team    Sullivan Lone MD [place] AAHIV[facility] Hematology/Oncology Physician [facility]  (Office):       [phone] (Work cell):   [phone] (Fax):           [phone]

## 2015-09-25 NOTE — H&P (Signed)
[facility] History and Physical  Kristen Baker [id number] DOB: 05/28/1933 DOA: 09/25/2015  Referring physician: Dr. Suzan Slick PCP: Sullivan Lone, MD   Chief Complaint: headache nausea and weakness  HPI: Kristen Baker is a 81 y.o. female recently diagnosed with Burkitt's lymphoma and started on EPOCH-R which she has received 2 cycles with intrathecal mature tracks 8 and hydrocortisone for left lip to meningeal involvement. Went to her oncologist office reporting generalized weakness headache and nausea. She relates this all started about 2 days prior to admission she has been able to keep only minimal fluids. She denies any fever, photophobia or focal symptoms. She denies any sick contacts any diarrhea, cough, chest pain or shortness of breath. She relates no cough with eating or drinking. She relates she has been tolerating her chemotherapy well and has been tapered down on her steroids. She also relates that her headache is worse with sitting up  In the doctor's office: Her sodium is 131 with a chloride of 98 and creatinine is 0.8 glucose 156   Review of Systems:  Constitutional:  No weight loss, night sweats, Fevers, chills, fatigue.  HEENT:  No headaches, Difficulty swallowing,Tooth/dental problems,Sore throat,  No sneezing, itching, ear ache, nasal congestion, post nasal drip,  Cardio-vascular:  No chest pain, Orthopnea, PND, swelling in lower extremities, anasarca, dizziness, palpitations  GI:  No heartburn, indigestion, abdominal pain, nausea, vomiting, diarrhea, change in bowel habits, loss of appetite  Resp:  No shortness of breath with exertion or at rest. No excess mucus, no productive cough, No non-productive cough, No coughing up of blood.No change in color of mucus.No wheezing.No chest wall deformity  Skin:  no rash or lesions.  GU:  no dysuria, change in color of urine, no urgency or frequency. No flank pain.  Musculoskeletal:  No joint pain or swelling. No  decreased range of motion. No back pain.  Psych:  No change in mood or affect. No depression or anxiety. No memory loss.   Past Medical History  Diagnosis Date  . Cancer (HCC)     cervical cancer  . Burkitt's lymphoma ([place]) 08/29/2015   Past Surgical History  Procedure Laterality Date  . Cholecystectomy    . Tonsillectomy    . Abdominal hysterectomy    . Abdominal surgery      2-3 rd of colon removed   Social History:  reports that she has never smoked. She has never used smokeless tobacco. She reports that she does not drink alcohol or use illicit drugs.  Allergies  Allergen Reactions  . Cabbage     unknown  . Codeine Nausea Only    Nausea, headache  . Onion     unknown  . Shellfish Allergy     Cant breath   . Sulfa Antibiotics     Rash   . Zofran [Ondansetron Hcl] Nausea And Vomiting    Family History  Problem Relation Age of Onset  . Gallbladder disease Mother     Prior to Admission medications   Medication Sig Start Date End Date Taking? Authorizing Provider  omeprazole (PRILOSEC) 40 MG capsule Take 40 mg by mouth daily.   Yes Historical Provider, MD  polyethylene glycol (MIRALAX / GLYCOLAX) packet Take 17 g by mouth daily. 09/20/15  Yes Tornillo, MD  [place] infusion   Yes Historical Provider, MD  prochlorperazine (COMPAZINE) 10 MG tablet Take 1 tablet (10 mg total) by mouth every 6 (six) hours as needed for nausea or vomiting. 08/31/15  Yes  Annita Brod, MD  senna (SENOKOT) 8.6 MG TABS tablet Take 2 tablets (17.2 mg total) by mouth at bedtime. 09/20/15  Yes Brunetta Genera, MD  traMADol (ULTRAM) 50 MG tablet Take 1 tablet (50 mg total) by mouth every 6 (six) hours as needed for moderate pain. 08/31/15  Yes Annita Brod, MD  Vitamin D, Ergocalciferol, (DRISDOL) 50000 UNITS CAPS capsule Take 50,000 Units by mouth every 7 (seven) days. Take on Tuesdays   Yes Historical Provider, MD   Physical Exam: There were no  vitals filed for this visit.  Wt Readings from Last 3 Encounters:  09/17/15 74.617 kg (164 lb 8 oz)  09/09/15 79.334 kg (174 lb 14.4 oz)  09/09/15 78.926 kg (174 lb)    General:  Appears calm and comfortable in no acute distress Eyes: PERRL, normal lids, irises & conjunctiva ENT: grossly normal hearing, lips & tongue Neck: no LAD, masses or thyromegaly Cardiovascular: RRR. Telemetry: SR, no arrhythmias  Respiratory: CTA bilaterally, no w/r/r. Normal respiratory effort. Abdomen: soft, ntnd Skin: no rash or induration seen on limited exam Musculoskeletal: grossly normal tone BUE/BLE Psychiatric: grossly normal mood and affect, speech fluent and appropriate Neurologic: She is awake alert and oriented 3 nonfocal rigidity 3-12 are grossly intact sensation is intact throughout muscle strength is followed in all 4 extremities the tendon reflexes are 2+ symmetrical bilaterally.           Labs on Admission:  Basic Metabolic Panel:  Recent Labs Lab 09/19/15 0845 09/20/15 0445 09/25/15 1532  NA 137 137 131*  K 3.8 3.9 4.2  CL 102 103  --   CO2 30 29 26   GLUCOSE 97 101* 156*  BUN 19 17 13.3  CREATININE 0.58 0.63 0.6  CALCIUM 8.2* 8.1* 8.3*   Liver Function Tests:  Recent Labs Lab 09/19/15 0845 09/20/15 0445 09/25/15 1532  AST 14* 13* <7  ALT 19 17 15   ALKPHOS 67 59 75  BILITOT 0.7 0.7 0.49  PROT 4.9* 4.5* 5.1*  ALBUMIN 2.6* 2.5* 2.7*   No results for input(s): LIPASE, AMYLASE in the last 168 hours. No results for input(s): AMMONIA in the last 168 hours. CBC:  Recent Labs Lab 09/19/15 0845 09/20/15 0445 09/25/15 1532  WBC 2.5* 2.4* 0.4*  NEUTROABS 1.7 1.7  --   HGB 10.0* 9.2* 9.8*  HCT 29.5* 27.1* 29.0*  MCV 91.3 90.9 90.1  PLT 270 257 117*   Cardiac Enzymes: No results for input(s): CKTOTAL, CKMB, CKMBINDEX, TROPONINI in the last 168 hours.  BNP (last 3 results) No results for input(s): BNP in the last 8760 hours.  ProBNP (last 3 results) No results  for input(s): PROBNP in the last 8760 hours.  CBG: No results for input(s): GLUCAP in the last 168 hours.  Radiological Exams on Admission: No results found.  EKG: Independently reviewed. none  Assessment/Plan Generalized weakness/  Headache: - She relates her headache is worse when she sits it OR stands up, no nuchal rigidity. I am concerned about a CSF leak. Also in the differential would be chemically induced meningitis as she had been on MTX treatment. Also contributing to this could be hypovolemia she is hyponatremic and her chloride is less than 100. - -Check an MRI of the brain and lumbar spine, check urinary sodium urinary creatinine. - Start her on supportive care aggressive IV fluid Compazine and Phenergan for nausea. Tramadol for pain. For breakthrough pain I will use morphine. - Colleges already been notified and they will see in  the morning. - Posterior doesn't O's. Check a CBC and a basic metabolic panel in the morning. Also check appointments level.  Burkitt lymphoma [facility]): - Oncology follow-up.   Oncology consulted  Code Status:Full DVT Prophylaxis:SCD's Family Communication: daughter Disposition Plan: inpatient  Time spent: 79 min  Charlynne Cousins [facility] Pager [phone]

## 2015-09-25 NOTE — Progress Notes (Signed)
Patient arrived to unit at around 1700,from the [place]. Patient is alert and oriented x3. Placed comfortably in bed.  She c/o headache and neck pain, offered pain med, patient refused.  Will continue to monitor.

## 2015-09-25 NOTE — Patient Instructions (Signed)

## 2015-09-25 NOTE — Telephone Encounter (Signed)
TC from pt's daughter, Rosalee Kaufman.  She states her mother was discharged from hospital on 09/20/15 s/p 5 days chemotherapy.  Pt now extremely weak, with increased nausea despite medication,, loose stools, very pale and sleeping more.  Denies fever or chills.  Pt is not scheduled to see Dr. Irene Limbo until Monday, 09/30/15. Diane feels she needs to be seen sooner and hopefully avoid ED trip or admission to hospital. Spoke with Delle Reining, RN with Dr. Heath Lark that we could see pt in [facility] this afternoon after labs.  Loren will inform Dr. Irene Limbo.  Advised daughter of appt and she is agreeable and as soon as pt gets dressed they will be on their way here.  POF sent

## 2015-09-26 ENCOUNTER — Inpatient Hospital Stay (HOSPITAL_COMMUNITY): Payer: Medicare Other

## 2015-09-26 DIAGNOSIS — D649 Anemia, unspecified: Secondary | ICD-10-CM

## 2015-09-26 DIAGNOSIS — E871 Hypo-osmolality and hyponatremia: Secondary | ICD-10-CM

## 2015-09-26 DIAGNOSIS — D702 Other drug-induced agranulocytosis: Secondary | ICD-10-CM

## 2015-09-26 DIAGNOSIS — C8371 Burkitt lymphoma, lymph nodes of head, face, and neck: Secondary | ICD-10-CM

## 2015-09-26 DIAGNOSIS — D72819 Decreased white blood cell count, unspecified: Secondary | ICD-10-CM

## 2015-09-26 DIAGNOSIS — G44021 Chronic cluster headache, intractable: Secondary | ICD-10-CM

## 2015-09-26 DIAGNOSIS — R531 Weakness: Secondary | ICD-10-CM

## 2015-09-26 DIAGNOSIS — C719 Malignant neoplasm of brain, unspecified: Secondary | ICD-10-CM

## 2015-09-26 DIAGNOSIS — I6330 Cerebral infarction due to thrombosis of unspecified cerebral artery: Secondary | ICD-10-CM

## 2015-09-26 HISTORY — DX: Malignant neoplasm of brain, unspecified: C71.9

## 2015-09-26 LAB — GLUCOSE, CAPILLARY
GLUCOSE-CAPILLARY: 160 mg/dL — AB (ref 65–99)
GLUCOSE-CAPILLARY: 102 mg/dL — AB (ref 65–99)
Glucose-Capillary: 131 mg/dL — ABNORMAL HIGH (ref 65–99)
Glucose-Capillary: 88 mg/dL (ref 65–99)

## 2015-09-26 LAB — SODIUM, URINE, RANDOM: Sodium, Ur: 53 mmol/L

## 2015-09-26 LAB — CBC
HEMATOCRIT: 26.8 % — AB (ref 36.0–46.0)
HEMOGLOBIN: 9.1 g/dL — AB (ref 12.0–15.0)
MCH: 30.8 pg (ref 26.0–34.0)
MCHC: 34 g/dL (ref 30.0–36.0)
MCV: 90.8 fL (ref 78.0–100.0)
Platelets: 102 10*3/uL — ABNORMAL LOW (ref 150–400)
RBC: 2.95 MIL/uL — AB (ref 3.87–5.11)
RDW: 13.3 % (ref 11.5–15.5)
WBC: 0.4 10*3/uL — CL (ref 4.0–10.5)

## 2015-09-26 LAB — BASIC METABOLIC PANEL
Anion gap: 6 (ref 5–15)
BUN: 11 mg/dL (ref 6–20)
CHLORIDE: 98 mmol/L — AB (ref 101–111)
CO2: 27 mmol/L (ref 22–32)
CREATININE: 0.49 mg/dL (ref 0.44–1.00)
Calcium: 8.3 mg/dL — ABNORMAL LOW (ref 8.9–10.3)
GFR calc non Af Amer: >60 mL/min (ref >60)
Glucose, Bld: 174 mg/dL — ABNORMAL HIGH (ref 65–99)
POTASSIUM: 4 mmol/L (ref 3.5–5.1)
Sodium: 131 mmol/L — ABNORMAL LOW (ref 135–145)

## 2015-09-26 LAB — CREATININE, URINE, RANDOM: CREATININE, URINE: 32.28 mg/dL

## 2015-09-26 MED ORDER — SODIUM CHLORIDE 0.9 % IV BOLUS (SEPSIS): 1000.0000 mL | Freq: Once | INTRAVENOUS | Status: DC

## 2015-09-26 MED ORDER — ACETAMINOPHEN 325 MG PO TABS
650.0000 mg | ORAL_TABLET | Freq: Four times a day (QID) | ORAL | Status: DC | PRN
  Administered 2015-09-27: 650 mg via ORAL
  Filled 2015-09-26: qty 2

## 2015-09-26 MED ORDER — GADOBENATE DIMEGLUMINE 529 MG/ML IV SOLN
15.0000 mL | Freq: Once | INTRAVENOUS | Status: AC | PRN
  Administered 2015-09-26: 14 mL via INTRAVENOUS

## 2015-09-26 MED ORDER — LIP MEDEX EX OINT
TOPICAL_OINTMENT | CUTANEOUS | Status: AC
  Administered 2015-09-26: 05:00:00
  Filled 2015-09-26: qty 7

## 2015-09-26 MED ORDER — FOLIC ACID 1 MG PO TABS
1.0000 mg | ORAL_TABLET | Freq: Every day | ORAL | Status: DC
  Administered 2015-09-27 – 2015-09-30 (×4): 1 mg via ORAL
  Filled 2015-09-26 (×4): qty 1

## 2015-09-26 MED ORDER — SODIUM CHLORIDE 0.9 % IV SOLN
INTRAVENOUS | Status: DC
  Administered 2015-09-26 – 2015-09-29 (×4): via INTRAVENOUS

## 2015-09-26 MED ORDER — SODIUM CHLORIDE 0.9 % IV BOLUS (SEPSIS)
500.0000 mL | Freq: Once | INTRAVENOUS | Status: AC
  Administered 2015-09-26: 500 mL via INTRAVENOUS

## 2015-09-26 NOTE — Plan of Care (Signed)
Problem: Pain Managment: Goal: General experience of comfort will improve Outcome: Progressing Patient denies pain and headaches.

## 2015-09-26 NOTE — Plan of Care (Signed)
Problem: Safety: Goal: Ability to remain free from injury will improve Outcome: Progressing oob with assist to [facility]

## 2015-09-26 NOTE — Consult Note (Signed)
Stroke Consult Consulting Physician: Dr Aileen Fass  Chief Complaint: generalized weakness HPI: Kristen Baker is an 81 y.o. female hx of Burkitt's lymphoma on chemotherapy and intrathecal methotrexate presenting to the ED with generalized weakness and malaise. Headache is worse with standing. No nuchal rigidity. Of note, she did have an LP completed around 1 week ago. Neurology consulted after MRI brain shows acute infarcts.   MRI brain and L spine imaging reviewed, pertinent for lumbar spine showing multiple osseous lesions concerning for lymphomatous involvement. Brain shows 3 small infarcts in the bilateral frontal lobes and a new 10x4 mm focus of enhancement in the right cerebellum concerning for a nodular leptomeningieal or possible parenchymal metastasis.   Date last known well: unclear Time last known well:unclear tPA Given: No: unclear LSW, known metastatic disease   Past Medical History  Diagnosis Date  . Cancer (HCC)     cervical cancer  . Burkitt's lymphoma ([place]) 08/29/2015    Past Surgical History  Procedure Laterality Date  . Cholecystectomy    . Tonsillectomy    . Abdominal hysterectomy    . Abdominal surgery      2-3 rd of colon removed    Family History  Problem Relation Age of Onset  . Gallbladder disease Mother    Social History:  reports that she has never smoked. She has never used smokeless tobacco. She reports that she does not drink alcohol or use illicit drugs.  Allergies:  Allergies  Allergen Reactions  . Cabbage     unknown  . Codeine Nausea Only    Nausea, headache  . Onion     unknown  . Shellfish Allergy     Cant breath   . Sulfa Antibiotics     Rash   . Zofran [Ondansetron Hcl] Nausea And Vomiting    Medications Prior to Admission  Medication Sig Dispense Refill  . omeprazole (PRILOSEC) 40 MG capsule Take 40 mg by mouth daily.    . polyethylene glycol (MIRALAX / GLYCOLAX) packet Take 17 g by mouth daily. 30 each 0  . PRESCRIPTION  MEDICATION [facility] infusion    . prochlorperazine (COMPAZINE) 10 MG tablet Take 1 tablet (10 mg total) by mouth every 6 (six) hours as needed for nausea or vomiting. 30 tablet 0  . senna (SENOKOT) 8.6 MG TABS tablet Take 2 tablets (17.2 mg total) by mouth at bedtime. 120 each 0  . traMADol (ULTRAM) 50 MG tablet Take 1 tablet (50 mg total) by mouth every 6 (six) hours as needed for moderate pain. 30 tablet 1  . Vitamin D, Ergocalciferol, (DRISDOL) 50000 UNITS CAPS capsule Take 50,000 Units by mouth every 7 (seven) days. Take on Tuesdays      ROS: Out of a complete 14 system review, the patient complains of only the following symptoms, and all other reviewed systems are negative. +weakness  Physical Examination: Filed Vitals:   09/26/15 1930  BP: 106/44  Pulse: 112  Temp: 99.7 F (37.6 C)  Resp: 20   Physical Exam  Constitutional: He appears well-developed and well-nourished.  Psych: Affect appropriate to situation Eyes: No scleral injection HENT: No OP obstrucion Head: Normocephalic.  Cardiovascular: Normal rate and regular rhythm.  Respiratory: Effort normal and breath sounds normal.  GI: Soft. Bowel sounds are normal. No distension. There is no tenderness.  Skin: WDI   Neurologic Examination: Mental Status: Lethargic but easily aroused, oriented to name and date, thought content appropriate.  Speech fluent without evidence of aphasia. Mild dysarthria. Able to  follow multi-step commands. Cranial Nerves: II: optic discs not visualized, visual fields grossly normal, pupils equal, round, reactive to light III,IV, VI: mild ptosis right eye, lateral deviation of right eye, unable to move past midline V,VII: smile symmetric, facial light touch sensation normal bilaterally VIII: hearing normal bilaterally IX,X: gag reflex present XI: trapezius strength/neck flexion strength normal bilaterally XII: tongue strength normal  Motor: Limited but appears symmetric and able to move all  extremities against gravity and light resistance Tone and bulk:normal tone throughout; no atrophy noted Sensory:  light touch intact throughout, bilaterally Deep Tendon Reflexes: 1+ and symmetric throughout Plantars: Right: downgoing   Left: downgoing Cerebellar: normal finger-to-nose Gait: deferred  Laboratory Studies:   Basic Metabolic Panel:  Recent Labs Lab 09/20/15 0445 09/25/15 1532 09/25/15 1823 09/26/15 0438  NA 137 131*  --  131*  K 3.9 4.2  --  4.0  CL 103  --   --  98*  CO2 29 26  --  27  GLUCOSE 101* 156*  --  174*  BUN 17 13.3  --  11  CREATININE 0.63 0.6  --  0.49  CALCIUM 8.1* 8.3*  --  8.3*  MG  --   --  1.7  --     Liver Function Tests:  Recent Labs Lab 09/20/15 0445 09/25/15 1532  AST 13* <7  ALT 17 15  ALKPHOS 59 75  BILITOT 0.7 0.49  PROT 4.5* 5.1*  ALBUMIN 2.5* 2.7*   No results for input(s): LIPASE, AMYLASE in the last 168 hours. No results for input(s): AMMONIA in the last 168 hours.  CBC:  Recent Labs Lab 09/20/15 0445 09/25/15 1532 09/26/15 0438  WBC 2.4* 0.4* 0.4*  NEUTROABS 1.7  --   --   HGB 9.2* 9.8* 9.1*  HCT 27.1* 29.0* 26.8*  MCV 90.9 90.1 90.8  PLT 257 117* 102*    Cardiac Enzymes: No results for input(s): CKTOTAL, CKMB, CKMBINDEX, TROPONINI in the last 168 hours.  BNP: Invalid input(s): POCBNP  CBG:  Recent Labs Lab 09/25/15 2121 09/26/15 0735 09/26/15 1233 09/26/15 1639 09/26/15 2030  GLUCAP 195* 160* 131* 88 102*    Microbiology: Results for orders placed or performed during the hospital encounter of 08/18/15  CSF culture with Stat gram stain     Status: None   Collection Time: 08/20/15  4:55 PM  Result Value Ref Range Status   Specimen Description CSF  Final   Special Requests NONE  Final   Gram Stain   Final    WBC PRESENT, PREDOMINANTLY MONONUCLEAR NO ORGANISMS SEEN CYTOSPIN    Culture NO GROWTH 3 DAYS  Final   Report Status 08/23/2015 FINAL  Final  Gram stain     Status: None    Collection Time: 08/26/15  2:22 PM  Result Value Ref Range Status   Specimen Description CSF  Final   Special Requests NONE  Final   Gram Stain   Final    NO ORGANISMS SEEN WBC SEEN Gram Stain Report Called to,Read Back By and Verified With: D TORRES AT 1517 ON 10.17.2016 BY NBROOKS    Report Status 08/26/2015 FINAL  Final    Coagulation Studies: No results for input(s): LABPROT, INR in the last 72 hours.  Urinalysis: No results for input(s): COLORURINE, LABSPEC, PHURINE, GLUCOSEU, HGBUR, BILIRUBINUR, KETONESUR, PROTEINUR, UROBILINOGEN, NITRITE, LEUKOCYTESUR in the last 168 hours.  Invalid input(s): APPERANCEUR  Lipid Panel:  No results found for: CHOL, TRIG, HDL, CHOLHDL, VLDL, LDLCALC  HgbA1C: No results  found for: HGBA1C  Urine Drug Screen:  No results found for: LABOPIA, COCAINSCRNUR, LABBENZ, AMPHETMU, THCU, LABBARB  Alcohol Level: No results for input(s): ETH in the last 168 hours.  Other results:  Imaging: Mr Kizzie Fantasia Contrast  09/26/2015  CLINICAL DATA:  Headaches. Burkitt's lymphoma on chemotherapy. Low back pain. EXAM: MRI HEAD WITHOUT AND WITH CONTRAST TECHNIQUE: Multiplanar, multiecho pulse sequences of the brain and surrounding structures were obtained without and with intravenous contrast. CONTRAST:  57mL MULTIHANCE GADOBENATE DIMEGLUMINE 529 MG/ML IV SOLN COMPARISON:  08/19/2015 FINDINGS: There are 2 small foci of restricted diffusion which measure 5 mm in the right centrum semiovale and 3 mm in the left frontal white matter anterior to the frontal horn, neither of which have associated enhancement. There is an 8 mm focus of hyperintense diffusion weighted signal in the subcortical white matter of the posterior left frontal lobe with normal ADC and no enhancement. There is no evidence of intracranial hemorrhage, mass, midline shift, or extra-axial fluid collection. There is mild generalized cerebral atrophy. Minimal T2 hyperintensities are present in the deep cerebral  white matter bilaterally, slightly progressed from the prior study and nonspecific but may be post treatment in nature. There is a new 10 x 4 mm nodular focus of enhancement in the right cerebellum (series 11, image 10 and series 13, image 19) without associated parenchymal edema. No abnormal parenchymal brain enhancement or definite abnormal leptomeningeal enhancement is identified elsewhere. Diffuse smooth pachymeningeal enhancement on the prior study has decreased/largely resolved, although some of the apparent difference may be accounted for by differences in magnetic field strength between the 2 examinations (3T on the prior study). Prior bilateral cataract extraction is noted. No significant inflammatory changes are identified in the paranasal sinuses or mastoid air cells. Major intracranial vascular flow voids are preserved. IMPRESSION: 1. Three subcentimeter foci of abnormal diffusion-weighted signal in the white matter of both frontal lobes, consistent with acute to subacute small vessel infarcts. 2. New 10 x 4 mm focus of enhancement in the right cerebellum concerning for a nodular leptomeningeal or possibly parenchymal metastasis. 3. Decreased diffuse pachymeningeal enhancement. Electronically Signed   By: Logan Bores M.D.   On: 09/26/2015 15:45   Mr Lumbar Spine Wo Contrast  09/26/2015  CLINICAL DATA:  Low back pain. Headache. History of Burkitt's lymphoma. EXAM: MRI LUMBAR SPINE WITHOUT CONTRAST TECHNIQUE: Multiplanar, multisequence MR imaging of the lumbar spine was performed. No intravenous contrast was administered. COMPARISON:  None. FINDINGS: The vertebral bodies of the lumbar spine are normal in size. The vertebral bodies of the lumbar spine are normal in alignment. There is degenerative disc disease with disc height loss at L4-5 and L5-S1. There is abnormal marrow lesion in the left side of the sacrum measuring 2.4 x 2.6 cm most consistent lipomatous involvement. There are peripherally T2  hyperintense marrow lesions in the S1, S3 vertebral bodies as well as the posterior elements consistent with areas of lipomatous involvement. Stippled low signal foci throughout the lumbar spine concerning for lymphomatous involvement. The spinal cord is normal in signal and contour. The cord terminates normally at L1 . The nerve roots of the cauda equina and the filum terminale are normal. The visualized portions of the SI joints are unremarkable. Partially visualized is a retroperitoneal soft tissue mass adjacent to the right psoas muscle measuring at least 5.2 by 3.9 cm. There is an enlarged left para-aortic lymph node measuring 11 mm in short axis. T12-L1: No significant disc bulge. No evidence of  neural foraminal stenosis. No central canal stenosis. L1-L2: No significant disc bulge. No evidence of neural foraminal stenosis. No central canal stenosis. Bilateral facet arthropathy. L2-L3: Mild broad-based disc bulge. Mild bilateral facet arthropathy. No evidence of neural foraminal stenosis. No central canal stenosis. L3-L4: Mild broad-based disc bulge. Moderate bilateral facet arthropathy. No evidence of neural foraminal stenosis. No central canal stenosis. L4-L5: Moderate broad-based disc bulge with a more focal right lateral component abutting the right extra foraminal L4 nerve root. No evidence of neural foraminal stenosis. Mild spinal stenosis. L5-S1: Mild broad-based disc bulge with a right lateral disc osteophyte complex. Mild bilateral facet arthropathy. No evidence of neural foraminal stenosis. No central canal stenosis. IMPRESSION: 1. No acute injury of the lumbar spine. 2. Mild lumbar spine spondylosis as described above. 3. Multiple osseous lesions of the sacrum and ilium most concerning for lymphomatous involvement. Stippled low signal foci throughout the lumbar spine concerning for lymphomatous involvement. Electronically Signed   By: Kathreen Devoid   On: 09/26/2015 14:48    Assessment: 81 y.o.  female with hx of Burkitt's lymphoma admitted with generalized weakness, malaise and refractory headache. Neurology consulted after MRI brain shows 3 small infarcts.    -check 2D echo to rule out embolic source. Hold on MRA and carotid doppler as likely not going to change management -check lipid panel and hemoglobin A1c -ASA 81mg  daily if no medical contraindication -continue IV hydration and pain management of headache. Would consider blood patch if headache does not improve.    Jim Like, DO [facility] [phone]  If 7pm- 7am, please page neurology on call as listed in [place]. 09/26/2015, 9:02 PM

## 2015-09-26 NOTE — Progress Notes (Signed)
[facility] PROGRESS NOTE    Progress Note   Kristen Baker [id number] DOB: Aug 09, 1933 DOA: 09/25/2015 PCP: Sullivan Lone, MD   Brief Narrative:   Kristen Baker is an 81 y.o. female has medical history of Burkitt's lymphoma on chemotherapy and intrathecal MTX came into the hospital complaining of headache generalized nausea  Assessment/Plan:  Generalized weakness/ Headache: - MRI of the brain and lumbar spine are pending. - Her symptoms are worse with standing up and better with lying down, which have not changed. - Continue IV fluids. Phenergan and Compazine for nausea.  Burkitt lymphoma ([place]) with leptomeningeal involvement: As per college is no she has received 2 cycles of chemotherapy and her B symptoms have improved. Going back to the chart LDH has improved significantly.  Pancytopenia/neutropenia drug-induced: Likely due to chemotherapy. She has remained afebrile.  Hyponatremia: Increase IV fluids change him to normal saline.     DVT Prophylaxis - Lovenox ordered.  Family Communication: daughter Disposition Plan: Home 3-4 days Code Status:     Code Status Orders        Start     Ordered   09/25/15 1756  Full code   Continuous     09/25/15 1758    Advance Directive Documentation        Most Recent Value   Type of Advance Directive  Living will   Pre-existing out of facility DNR order (yellow form or pink MOST form)     "MOST" Form in Place?          IV Access:    Peripheral IV   Procedures and diagnostic studies:   No results found.   Medical Consultants:    None.  Anti-Infectives:   Anti-infectives    None      Subjective:    Kristen Baker she relates she still has headache when she sits up.  Objective:    Filed Vitals:   09/25/15 2123 09/25/15 2206 09/26/15 0500 09/26/15 0520  BP: 126/51  112/44   Pulse: 73 80 85   Temp: 97.4 F (36.3 C)  97.7 F (36.5 C)   TempSrc: Oral  Oral   Resp: 16  16   Height:     5\' 5"  (1.651 m)  Weight:    74.3 kg (163 lb 12.8 oz)  SpO2: 95%  94%     Intake/Output Summary (Last 24 hours) at 09/26/15 0743 Last data filed at 09/26/15 0521  Gross per 24 hour  Intake  933.5 ml  Output    650 ml  Net  283.5 ml   Filed Weights   09/26/15 0520  Weight: 74.3 kg (163 lb 12.8 oz)    Exam: Gen:  NAD Cardiovascular:  RRR, No M/R/G Chest and lungs:   CTAB Abdomen:  Abdomen soft, NT/ND, + BS Extremities:  No C/E/C   Data Reviewed:    Labs: Basic Metabolic Panel:  Recent Labs Lab 09/19/15 0845 09/20/15 0445 09/25/15 1532 09/25/15 1823 09/26/15 0438  NA 137 137 131*  --  131*  K 3.8 3.9 4.2  --  4.0  CL 102 103  --   --  98*  CO2 30 29 26   --  27  GLUCOSE 97 101* 156*  --  174*  BUN 19 17 13.3  --  11  CREATININE 0.58 0.63 0.6  --  0.49  CALCIUM 8.2* 8.1* 8.3*  --  8.3*  MG  --   --   --  1.7  --  GFR Estimated Creatinine Clearance: 56.6 mL/min (by C-G formula based on Cr of 0.49). Liver Function Tests:  Recent Labs Lab 09/19/15 0845 09/20/15 0445 09/25/15 1532  AST 14* 13* <7  ALT 19 17 15   ALKPHOS 67 59 75  BILITOT 0.7 0.7 0.49  PROT 4.9* 4.5* 5.1*  ALBUMIN 2.6* 2.5* 2.7*   No results for input(s): LIPASE, AMYLASE in the last 168 hours. No results for input(s): AMMONIA in the last 168 hours. Coagulation profile No results for input(s): INR, PROTIME in the last 168 hours.  CBC:  Recent Labs Lab 09/19/15 0845 09/20/15 0445 09/25/15 1532 09/26/15 0438  WBC 2.5* 2.4* 0.4* 0.4*  NEUTROABS 1.7 1.7  --   --   HGB 10.0* 9.2* 9.8* 9.1*  HCT 29.5* 27.1* 29.0* 26.8*  MCV 91.3 90.9 90.1 90.8  PLT 270 257 117* 102*   Cardiac Enzymes: No results for input(s): CKTOTAL, CKMB, CKMBINDEX, TROPONINI in the last 168 hours. BNP (last 3 results) No results for input(s): PROBNP in the last 8760 hours. CBG:  Recent Labs Lab 09/25/15 2121  GLUCAP 195*   D-Dimer: No results for input(s): DDIMER in the last 72 hours. Hgb A1c: No  results for input(s): HGBA1C in the last 72 hours. Lipid Profile: No results for input(s): CHOL, HDL, LDLCALC, TRIG, CHOLHDL, LDLDIRECT in the last 72 hours. Thyroid function studies: No results for input(s): TSH, T4TOTAL, T3FREE, THYROIDAB in the last 72 hours.  Invalid input(s): FREET3 Anemia work up: No results for input(s): VITAMINB12, FOLATE, FERRITIN, TIBC, IRON, RETICCTPCT in the last 72 hours. Sepsis Labs:  Recent Labs Lab 09/19/15 0845 09/20/15 0445 09/25/15 1532 09/26/15 0438  WBC 2.5* 2.4* 0.4* 0.4*   Microbiology No results found for this or any previous visit (from the past 240 hour(s)).   Medications:   . insulin aspart  0-9 Units Subcutaneous TID WC  . lip balm      . pantoprazole  40 mg Oral Daily  . polyethylene glycol  17 g Oral Daily  . senna  2 tablet Oral QHS   Continuous Infusions: . dextrose 5 % and 0.45% NaCl 75 mL/hr at 09/25/15 1800    Time spent: 15 min   LOS: 1 day   Charlynne Cousins  [facility] Pager [phone]  *Please refer to [email or web address], password [email or web address] to get updated schedule on who will round on this patient, as hospitalists switch teams weekly. If 7PM-7AM, please contact night-coverage at [email or web address], password [email or web address] for any overnight needs.  09/26/2015, 7:43 AM

## 2015-09-26 NOTE — Progress Notes (Signed)
Initial Nutrition Assessment  DOCUMENTATION CODES:   Severe malnutrition in context of chronic illness  INTERVENTION:   -Provide Ensure Enlive po BID, each supplement provides 350 kcal and 20 grams of protein -Encourage PO intake -RD to continue to monitor  NUTRITION DIAGNOSIS:   Malnutrition related to chronic illness as evidenced by percent weight loss, energy intake < or equal to 75% for > or equal to 1 month.  GOAL:   Patient will meet greater than or equal to 90% of their needs  MONITOR:   PO intake, Supplement acceptance, Labs, Weight trends, Skin, I & O's  REASON FOR ASSESSMENT:   Malnutrition Screening Tool    ASSESSMENT:   81 y.o. female has medical history of Burkitt's lymphoma on chemotherapy and intrathecal MTX came into the hospital complaining of headache generalized nausea  Patient in room with family at bedside. Per family, her appetite and eating had improved since her last admission when she received chemotherapy. Yesterday, the patient was vomiting and feeling very nauseous. She had eaten 2 crackers with chicken salad and grits.  This morning patient reports she ate yogurt and cream of potato soup with no issues. RD to order Ensure supplements for patient to sip on.  Per weight history, pt has lost 16 lb since 10/10 (9% weight loss x 1 month, significant for time frame). Unable to perform nutrition focused physical exam as patient went to the bathroom by the end of the visit.  Labs reviewed: CBGs: 160-195 Mg WNL  Diet Order:  Diet full liquid Room service appropriate?: Yes; Fluid consistency:: Thin  Skin:  Reviewed, no issues  Last BM:  11/16  Height:   Ht Readings from Last 1 Encounters:  09/26/15 5\' 5"  (1.651 m)    Weight:   Wt Readings from Last 1 Encounters:  09/26/15 163 lb 12.8 oz (74.3 kg)    Ideal Body Weight:  56.8 kg  BMI:  Body mass index is 27.26 kg/(m^2).  Estimated Nutritional Needs:   Kcal:  1900-2100  Protein:   105-115g  Fluid:  2L/day  EDUCATION NEEDS:   No education needs identified at this time  Clayton Bibles, MS, RD, LDN Pager: [phone] After Hours Pager: [phone]

## 2015-09-26 NOTE — Progress Notes (Signed)
WBC 0.4 this am. The same as yesterday.

## 2015-09-26 NOTE — Progress Notes (Signed)
Held pm laxatives. Patient reports she had 2 watery stools at home today.

## 2015-09-26 NOTE — Progress Notes (Signed)
MRI Tech called and stated MRI would not be done tonight but would be done in am.

## 2015-09-27 ENCOUNTER — Inpatient Hospital Stay (HOSPITAL_COMMUNITY): Payer: Medicare Other

## 2015-09-27 ENCOUNTER — Ambulatory Visit
Admission: RE | Admit: 2015-09-27 | Discharge: 2015-09-27 | Disposition: A | Discharge status: Home / Self Care | Payer: Medicare Other | Source: Ambulatory Visit | Attending: Radiation Oncology | Admitting: Radiation Oncology

## 2015-09-27 ENCOUNTER — Ambulatory Visit: Payer: No Typology Code available for payment source | Attending: Radiation Oncology | Admitting: Radiation Oncology

## 2015-09-27 DIAGNOSIS — I63133 Cerebral infarction due to embolism of bilateral carotid arteries: Secondary | ICD-10-CM

## 2015-09-27 DIAGNOSIS — D649 Anemia, unspecified: Secondary | ICD-10-CM | POA: Insufficient documentation

## 2015-09-27 DIAGNOSIS — C8370 Burkitt lymphoma, unspecified site: Secondary | ICD-10-CM | POA: Insufficient documentation

## 2015-09-27 DIAGNOSIS — I6789 Other cerebrovascular disease: Secondary | ICD-10-CM

## 2015-09-27 DIAGNOSIS — C7931 Secondary malignant neoplasm of brain: Secondary | ICD-10-CM | POA: Insufficient documentation

## 2015-09-27 DIAGNOSIS — M899 Disorder of bone, unspecified: Secondary | ICD-10-CM | POA: Insufficient documentation

## 2015-09-27 DIAGNOSIS — C8371 Burkitt lymphoma, lymph nodes of head, face, and neck: Secondary | ICD-10-CM | POA: Insufficient documentation

## 2015-09-27 DIAGNOSIS — C709 Malignant neoplasm of meninges, unspecified: Secondary | ICD-10-CM | POA: Insufficient documentation

## 2015-09-27 LAB — LIPID PANEL
Cholesterol: 119 mg/dL (ref 0–200)
HDL: 50 mg/dL (ref >40)
LDL Cholesterol: 58 mg/dL (ref 0–99)
Total CHOL/HDL Ratio: 2.4 RATIO
Triglycerides: 54 mg/dL (ref <150)
VLDL: 11 mg/dL (ref 0–40)

## 2015-09-27 LAB — BASIC METABOLIC PANEL
Anion gap: 8 (ref 5–15)
BUN: 12 mg/dL (ref 6–20)
CO2: 25 mmol/L (ref 22–32)
Calcium: 8.1 mg/dL — ABNORMAL LOW (ref 8.9–10.3)
Chloride: 98 mmol/L — ABNORMAL LOW (ref 101–111)
Creatinine, Ser: 0.69 mg/dL (ref 0.44–1.00)
GFR calc Af Amer: >60 mL/min (ref >60)
GFR calc non Af Amer: >60 mL/min (ref >60)
Glucose, Bld: 112 mg/dL — ABNORMAL HIGH (ref 65–99)
Potassium: 4 mmol/L (ref 3.5–5.1)
Sodium: 131 mmol/L — ABNORMAL LOW (ref 135–145)

## 2015-09-27 LAB — PREPARE RBC (CROSSMATCH)

## 2015-09-27 LAB — CBC
HCT: 25.2 % — ABNORMAL LOW (ref 36.0–46.0)
Hemoglobin: 8.5 g/dL — ABNORMAL LOW (ref 12.0–15.0)
MCH: 30.5 pg (ref 26.0–34.0)
MCHC: 33.7 g/dL (ref 30.0–36.0)
MCV: 90.3 fL (ref 78.0–100.0)
Platelets: 63 10*3/uL — ABNORMAL LOW (ref 150–400)
RBC: 2.79 MIL/uL — ABNORMAL LOW (ref 3.87–5.11)
RDW: 13.6 % (ref 11.5–15.5)
WBC: 1.4 10*3/uL — CL (ref 4.0–10.5)

## 2015-09-27 LAB — GLUCOSE, CAPILLARY
GLUCOSE-CAPILLARY: 104 mg/dL — AB (ref 65–99)
GLUCOSE-CAPILLARY: 117 mg/dL — AB (ref 65–99)
Glucose-Capillary: 148 mg/dL — ABNORMAL HIGH (ref 65–99)
Glucose-Capillary: 130 mg/dL — ABNORMAL HIGH (ref 65–99)

## 2015-09-27 LAB — CRYPTOCOCCAL ANTIGEN: Crypto Ag: NEGATIVE

## 2015-09-27 MED ORDER — SODIUM CHLORIDE 0.9 % IJ SOLN: 3.0000 mL | INTRAMUSCULAR | Status: DC | PRN

## 2015-09-27 MED ORDER — SODIUM CHLORIDE 0.9 % IV SOLN
250.0000 mL | Freq: Once | INTRAVENOUS | Status: AC
  Administered 2015-09-27: 250 mL via INTRAVENOUS

## 2015-09-27 MED ORDER — HEPARIN SOD (PORK) LOCK FLUSH 100 UNIT/ML IV SOLN
500.0000 [IU] | Freq: Every day | INTRAVENOUS | Status: AC | PRN
  Administered 2015-09-30: 500 [IU]
  Filled 2015-09-27: qty 5

## 2015-09-27 MED ORDER — HEPARIN SOD (PORK) LOCK FLUSH 100 UNIT/ML IV SOLN: 250.0000 [IU] | INTRAVENOUS | Status: DC | PRN

## 2015-09-27 MED ORDER — ASPIRIN EC 81 MG PO TBEC
81.0000 mg | DELAYED_RELEASE_TABLET | Freq: Every day | ORAL | Status: DC
Start: 2015-09-27 — End: 2015-09-27

## 2015-09-27 MED ORDER — SODIUM CHLORIDE 0.9 % IJ SOLN: 10.0000 mL | INTRAMUSCULAR | Status: DC | PRN

## 2015-09-27 MED ORDER — DEXAMETHASONE 4 MG PO TABS
4.0000 mg | ORAL_TABLET | Freq: Two times a day (BID) | ORAL | Status: DC
  Administered 2015-09-27 – 2015-09-30 (×8): 4 mg via ORAL
  Filled 2015-09-27 (×8): qty 1

## 2015-09-27 MED ORDER — ACETAMINOPHEN 325 MG PO TABS
650.0000 mg | ORAL_TABLET | Freq: Once | ORAL | Status: AC
  Administered 2015-09-27: 650 mg via ORAL
  Filled 2015-09-27: qty 2

## 2015-09-27 MED ORDER — SODIUM CHLORIDE 0.9 % IV BOLUS (SEPSIS)
500.0000 mL | Freq: Once | INTRAVENOUS | Status: AC
  Administered 2015-09-27: 500 mL via INTRAVENOUS

## 2015-09-27 NOTE — Progress Notes (Signed)
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 Da EPOCH-R s/p 2 cycles. 2. IT methotrexate + Hydrocortisone qweekly s/p 5 doses   Subjective:  Kristen Baker was seen this evening with her daughter and other family members at bedside. She notes she is feeling quite weak. Agreeable with blood transfusion. -ordered with her informed consent Poor oral intake. No headaches no nausea. Confusion from yesterday appears resolved . WBC appear to be bouncing back with the neulasta. No fevers or chills.  REVIEW OF SYSTEMS:    10 Point review of systems of done and is negative except as noted above.  . Past Medical History  Diagnosis Date  . Cancer (HCC)     cervical cancer  . Burkitt's lymphoma ([place]) 08/29/2015    . Past Surgical History  Procedure Laterality Date  . Cholecystectomy    . Tonsillectomy    . Abdominal hysterectomy    . Abdominal surgery      2-3 rd of colon removed    . Social History  Substance Use Topics  . Smoking status: Never Smoker   . Smokeless tobacco: Never Used  . Alcohol Use: No    ALLERGIES:  is allergic to cabbage; codeine; onion; shellfish allergy; sulfa antibiotics; and zofran.  MEDICATIONS:  Reviewed in EPIC.  PHYSICAL EXAMINATION: ECOG PERFORMANCE STATUS: 2 - Symptomatic, <50% confined to bed  . Filed Vitals:   09/27/15 2210  BP: 112/48  Pulse: 73  Temp: 97.3 F (36.3 C)  Resp: 20    Filed Weights   09/26/15 0520  Weight: 163 lb 12.8 oz (74.3 kg)  visit  Part  .Body mass index is 27.26 kg/(m^2).  GENERAL:alert,elderly lady, SKIN: no acute skin rashes EYES: normal, conjunctiva are pink and non-injected, sclera clear OROPHARYNX:no exudate, no erythema and lips,oral mucosa is somewhat dry. NECK: supple, no JVD, thyroid normal size,  non-tender, without nodularity LYMPH:  no palpable lymphadenopathy in the cervical, axillary or inguinal LUNGS: clear to auscultation with normal respiratory effort HEART: regular rate & rhythm,  no murmurs and no lower extremity edema ABDOMEN: abdomen soft, non-tender, normoactive bowel sounds  Musculoskeletal: no cyanosis of digits and no clubbing  PSYCH: alert & oriented x 3 with fluent speech NEURO: no focal motor/sensory deficits  LABORATORY DATA:   . CBC Latest Ref Rng 09/27/2015 09/26/2015 09/25/2015  WBC 4.0 - 10.5 K/uL 1.4(LL) 0.4(LL) 0.4(LL)  Hemoglobin 12.0 - 15.0 g/dL 8.5(L) 9.1(L) 9.8(L)  Hematocrit 36.0 - 46.0 % 25.2(L) 26.8(L) 29.0(L)  Platelets 150 - 400 K/uL 63(L) 102(L) 117(L)    . CMP Latest Ref Rng 09/27/2015 09/26/2015 09/25/2015  Glucose 65 - 99 mg/dL 112(H) 174(H) 156(H)  BUN 6 - 20 mg/dL 12 11 13.3  Creatinine 0.44 - 1.00 mg/dL 0.69 0.49 0.6  Sodium 135 - 145 mmol/L 131(L) 131(L) 131(L)  Potassium 3.5 - 5.1 mmol/L 4.0 4.0 4.2  Chloride 101 - 111 mmol/L 98(L) 98(L) -  CO2 22 - 32 mmol/L 25 27 26   Calcium 8.9 - 10.3 mg/dL 8.1(L) 8.3(L) 8.3(L)  Total Protein 6.4 - 8.3 g/dL - - 5.1(L)  Total Bilirubin 0.20 - 1.20 mg/dL - - 0.49  Alkaline Phos 40 - 150 U/L - - 75  AST 5 - 34 U/L - - <7  ALT 0 - 55 U/L - - 15   . Lab Results  Component Value Date   LDH 181 09/25/2015   RADIOGRAPHIC STUDIES: I have personally reviewed the radiological images as listed and agreed with the findings in the report. Dg Chest 2 View  09/06/2015  CLINICAL DATA:  Onset of weakness last night, generalized weakness throughout body, recently diagnosed with cervical cancer with first chemotherapy treatment last week, neutropenia, a intermittent midline lower abdominal pain EXAM: CHEST  2 VIEW COMPARISON:  08/18/2015 FINDINGS: RIGHT jugular Port-A-Cath with tip projecting over SVC. Normal heart size, mediastinal contours and pulmonary vascularity. Atherosclerotic calcification aorta.  Lungs clear. No pleural effusion or pneumothorax. Bones demineralized. IMPRESSION: No acute abnormalities. Electronically Signed   By: Lavonia Dana M.D.   On: 09/06/2015 14:17   [name] [id number] Contrast  09/26/2015  CLINICAL DATA:  Headaches. Burkitt's lymphoma on chemotherapy. Low back pain. EXAM: MRI HEAD WITHOUT AND WITH CONTRAST TECHNIQUE: Multiplanar, multiecho pulse sequences of the brain and surrounding structures were obtained without and with intravenous contrast. CONTRAST:  38mL MULTIHANCE GADOBENATE DIMEGLUMINE 529 MG/ML IV SOLN COMPARISON:  08/19/2015 FINDINGS: There are 2 small foci of restricted diffusion which measure 5 mm in the right centrum semiovale and 3 mm in the left frontal white matter anterior to the frontal horn, neither of which have associated enhancement. There is an 8 mm focus of hyperintense diffusion weighted signal in the subcortical white matter of the posterior left frontal lobe with normal ADC and no enhancement. There is no evidence of intracranial hemorrhage, mass, midline shift, or extra-axial fluid collection. There is mild generalized cerebral atrophy. Minimal T2 hyperintensities are present in the deep cerebral white matter bilaterally, slightly progressed from the prior study and nonspecific but may be post treatment in nature. There is a new 10 x 4 mm nodular focus of enhancement in the right cerebellum (series 11, image 10 and series 13, image 19) without associated parenchymal edema. No abnormal parenchymal brain enhancement or definite abnormal leptomeningeal enhancement is identified elsewhere. Diffuse smooth pachymeningeal enhancement on the prior study has decreased/largely resolved, although some of the apparent difference may be accounted for by differences in magnetic field strength between the 2 examinations (3T on the prior study). Prior bilateral cataract extraction is noted. No significant inflammatory changes are identified in the paranasal sinuses or  mastoid air cells. Major intracranial vascular flow voids are preserved. IMPRESSION: 1. Three subcentimeter foci of abnormal diffusion-weighted signal in the white matter of both frontal lobes, consistent with acute to subacute small vessel infarcts. 2. New 10 x 4 mm focus of enhancement in the right cerebellum concerning for a nodular leptomeningeal or possibly parenchymal metastasis. 3. Decreased diffuse pachymeningeal enhancement. Electronically Signed   By: Logan Bores M.D.   On: 09/26/2015 15:45   Mr Lumbar Spine Wo Contrast  09/26/2015  CLINICAL DATA:  Low back pain. Headache. History of Burkitt's lymphoma. EXAM: MRI LUMBAR SPINE WITHOUT CONTRAST TECHNIQUE: Multiplanar, multisequence MR imaging of the lumbar spine was performed. No intravenous contrast was administered. COMPARISON:  None. FINDINGS: The vertebral bodies of the lumbar spine are normal in size. The vertebral bodies of the lumbar spine are normal in alignment. There is degenerative disc disease with disc height loss at L4-5 and L5-S1. There is abnormal marrow lesion in the left side of the sacrum measuring 2.4 x 2.6 cm most consistent lipomatous involvement. There are peripherally T2 hyperintense marrow lesions in the S1, S3 vertebral bodies as well as the posterior elements consistent with areas of lipomatous involvement. Stippled low signal foci throughout the  lumbar spine concerning for lymphomatous involvement. The spinal cord is normal in signal and contour. The cord terminates normally at L1 . The nerve roots of the cauda equina and the filum terminale are normal. The visualized portions of the SI joints are unremarkable. Partially visualized is a retroperitoneal soft tissue mass adjacent to the right psoas muscle measuring at least 5.2 by 3.9 cm. There is an enlarged left para-aortic lymph node measuring 11 mm in short axis. T12-L1: No significant disc bulge. No evidence of neural foraminal stenosis. No central canal stenosis. L1-L2:  No significant disc bulge. No evidence of neural foraminal stenosis. No central canal stenosis. Bilateral facet arthropathy. L2-L3: Mild broad-based disc bulge. Mild bilateral facet arthropathy. No evidence of neural foraminal stenosis. No central canal stenosis. L3-L4: Mild broad-based disc bulge. Moderate bilateral facet arthropathy. No evidence of neural foraminal stenosis. No central canal stenosis. L4-L5: Moderate broad-based disc bulge with a more focal right lateral component abutting the right extra foraminal L4 nerve root. No evidence of neural foraminal stenosis. Mild spinal stenosis. L5-S1: Mild broad-based disc bulge with a right lateral disc osteophyte complex. Mild bilateral facet arthropathy. No evidence of neural foraminal stenosis. No central canal stenosis. IMPRESSION: 1. No acute injury of the lumbar spine. 2. Mild lumbar spine spondylosis as described above. 3. Multiple osseous lesions of the sacrum and ilium most concerning for lymphomatous involvement. Stippled low signal foci throughout the lumbar spine concerning for lymphomatous involvement. Electronically Signed   By: Kathreen Devoid   On: 09/26/2015 14:48   Dg Fluoro Guide Lumbar Puncture  09/20/2015  CLINICAL DATA:  81 year old female with lymphoma. Repeat intrathecal chemotherapy injection. Subsequent encounter. EXAM: DIAGNOSTIC LUMBAR PUNCTURE UNDER FLUOROSCOPIC GUIDANCE FLUOROSCOPY TIME:  Radiation Exposure Index (as provided by the fluoroscopic device): If the device does not provide the exposure index: Fluoroscopy Time (in minutes and seconds):  0 minutes 30 seconds Number of Acquired Images:  4 PROCEDURE: Informed consent was obtained from the patient prior to the procedure, including potential complications of headache, allergy, and pain. A "time-out" was performed. The chemotherapy pack age from the pharmacy was confirmed with the patient name and date of birth. With the patient prone, the lower back was prepped with Betadine. 1%  Lidocaine was used for local anesthesia. Lumbar puncture was performed at the L2-L3 level using right sub laminar approach, a 3.5 inch x 20 gauge needle. Initially when the stylet was removed blood was returned. The needle was repositioned, but blood again was returned. At this point a cross-table lateral view was obtained and demonstrated the tip of the needle within the posterior L3 vertebral body. The needle was then withdrawn 1 cm and slightly blood-tinged and cloudy CSF was returned. 4.5 ml of CSF were obtained for laboratory studies. The patient tolerated the procedure well and there were no apparent complications. Several mL of CSF were then held within the tubing to use as flush. The chemotherapy needle was connected to the unused hub of the 3 way stopcock, and the chemotherapy was slowly injected. This was followed by the CSF flush, and then the stylet was reintroduced to the needle. The needle was withdrawn, direct pressure was held, and hemostasis was noted. Appropriate post procedural orders were placed on the chart. The patient was transported to the ward in stable condition for continued treatment. IMPRESSION: Fluoroscopic guided lumbar puncture with intrathecal injection of chemotherapy at L2-L3. 4.5 mL of slightly blood-tinged, cloudy CSF obtained for laboratory studies prior to injection. Electronically Signed  By: Genevie Ann M.D.   On: 09/20/2015 13:50   Dg Fluoro Guide Lumbar Puncture  09/16/2015  CLINICAL DATA:  Lymphoma, methotrexate injection. EXAM: FLUOROSCOPICALLY GUIDED LUMBAR PUNCTURE FOR INTRATHECAL CHEMOTHERAPY TECHNIQUE: Informed consent was obtained from the patient prior to the procedure, including potential complications of headache, allergy, and pain. A 'time out' was performed. With the patient prone, the lower back was prepped with Betadine. 1% Lidocaine was used for local anesthesia. Lumbar puncture was performed at the L5-S1 level using a 20 gauge needle with return of clear  CSF. 12 mg Of methotrexate was injected into the subarachnoid space. The patient tolerated the procedure well without apparent complication. FLUOROSCOPY TIME:  Fluoroscopy Time (in minutes and seconds): 0 minutes 40 seconds. Number of Acquired Images:  None. IMPRESSION: 1. Intrathecal injection of chemotherapy without complication. 2. Due to extremely slow flow, less than 1 cc CSF was collected for laboratory evaluation. Electronically Signed   By: Lorin Picket M.D.   On: 09/16/2015 16:18   Dg Fluoro Guide Lumbar Puncture  09/09/2015  CLINICAL DATA:  Burkitt's lymphoma with leptomeningeal involvement. Third dose intrathecal chemotherapy EXAM: FLUOROSCOPICALLY GUIDED LUMBAR PUNCTURE FOR INTRATHECAL CHEMOTHERAPY TECHNIQUE: Informed consent was obtained from the patient prior to the procedure, including potential complications of headache, allergy, and pain. A 'time out' was performed. With the patient prone, the lower back was prepped with Betadine. 1% Lidocaine was used for local anesthesia. Lumbar puncture was performed at the L5-S1 using a 22 gauge needle with return of clear CSF. 12 mg methotrexate and 50 mg hydrocortisone pharmacy prepared was injected into the subarachnoid space. The patient tolerated the procedure well without apparent complication. FLUOROSCOPY TIME:  1 minutes 11 seconds IMPRESSION: Intrathecal injection of chemotherapy without complication Electronically Signed   By: Suzy Bouchard M.D.   On: 09/09/2015 11:08   Dg Fluoro Guide Lumbar Puncture  08/30/2015  CLINICAL DATA:  History of Burkitt's lymphoma. Diagnostic lumbar puncture with intrathecal chemotherapy injection. Subsequent encounter. EXAM: DIAGNOSTIC LUMBAR PUNCTURE UNDER FLUOROSCOPIC GUIDANCE FLUOROSCOPY TIME:  Radiation Exposure Index (as provided by the fluoroscopic device): If the device does not provide the exposure index: Fluoroscopy Time (in minutes and seconds):  44 seconds Number of Acquired Images:  0. PROCEDURE:  Informed consent was obtained from the patient prior to the procedure, including potential complications of headache, allergy, and pain. With the patient prone, the lower back was prepped with Betadine. 1% Lidocaine was used for local anesthesia. Lumbar puncture was performed at the L5-S1 level using a 20 gauge needle with return of initially blood tinged but clearing. Fourteen ml of CSF were obtained for laboratory studies. Laboratory prepared methotrexate was injected into the subarachnoid space without difficulty. The patient tolerated the procedure well and there were no apparent complications. IMPRESSION: Diagnostic lumbar puncture and intrathecal injection of methotrexate without complication. Electronically Signed   By: Inge Rise M.D.   On: 08/30/2015 15:44    ASSESSMENT & PLAN:   81 yo caucasian female with   1)Stage IVb Burkitts lymphoma with leptomeningeal involvement. On presentation patient had Retroperitoneal and Posterior Mediastinal Lnadenopathy with significantly elevated LDH and leukoerythroblastic picture on peipheral blood smear with multiple large atypical Lymphocytes. Patient had significant type B constitutional symptoms which have now resolved . MRI Brain with evidence of leptomeningeal involvement consistent with the patients symptoms of chin numbness though LP was unrevealing. Leptomeningeal enhancement much improved on MRI today but concerning new lesion in the rt cerebellum. BM packed with lymphoma LDH down from  3000's to 1900 to 1215 to 500's to 368 to 352 to 311 to 243 to 181 (today) S/p 2 cycles of daEPOCH-R + IT Methotrexate (weekly for now - received 5rd dose on 09/20/2015)  2) pancytopenia with significant neutropenia likely related to her chemotherapy.  Patient is likely at the nadir of her counts at this time.  She has received Neulasta on 09/23/2015. WBC count appear to be bouncing back. WBC upto 1.4k today. No fevers or chills at this  time.  3)significant fatigue likely from chemotherapy and symptomatic anemia and underlying disease.  4) dehydration -resolved  5) significant nausea and vomiting since yesterday along with orthostatic headaches and neck pain.  Could be from possible CSF leak from her recent intrathecal chemotherapy.  No overt fevers to suggest an infection or meningitis at this time.  Headaches and neck pains are significantly improved with laying flat in bed which is in favor of CSF leak.   MRI of the brain shows largely resolved leptomeningeal enhancement.  However noted to have 1 small 10x17mm nodular enhancing lesion in the right cerebellum concerning for parenchymal recurrence of her Burkitt's versus treatment related inflammation vs less likely an infectious process. 6) Subcentimeter lesions on MRI ?lacunar infarcts vs treatment related white matter changes. 7)hyponatremia likely related to dehydration and poor solute intake. 8) MRI L spine shows known BM involvement from [place]  -discussed further goals of care today. -patient and her family decided that we will see how she feels over the next 2 weeks off treatment to determine next steps - which is a reasonable plan. -if in the next 1-2 weeks patient still feels very fatigue and is unable to sustain her performance status and nutrition she notes that she would want to proceed with best supportive cares alone and not pursue additional definitive therapies. -Also if she is doing okay will get rpt MRI brain as outpatient in 2 weeks as well as PET/CT to reassess status of disease. -no headache/nausea today - try to sit up and then ambulate patient with PT tomorrow. -If orthostatic headache recurrent would consider epidural blood patch once ANC>1000. -dietary input. Help encourage patient with po intake. -will plan for possible discharge early to mid week home with HHS vs SNF. -continue dexamethasone 4mg  po BID   I spent 35 minutes counseling the  patient face to face. The total time spent in the appointment was 40 minutes and more than 50% was on counseling and direct patient cares and coordination of care with hospitalist team    Sullivan Lone MD [place] AAHIVMS [facility] Hematology/Oncology Physician [facility]  (Office):       [phone] (Work cell):  [phone] (Fax):           [phone]

## 2015-09-27 NOTE — Progress Notes (Signed)
Paged NP Schorr about low grade fever and results of MRI. Order for neuro cks obtained.

## 2015-09-27 NOTE — Progress Notes (Addendum)
Patient temp 99.7 this am. Paged NP Schorr. Also Urine output only 200-225 cc last night. BP SBP <100 this am. Orders received.

## 2015-09-27 NOTE — Progress Notes (Signed)
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 Da EPOCH-R s/p 2 cycles. 2. IT methotrexate + Hydrocortisone qweekly s/p 5 doses   Subjective:  Kristen Baker was seen this evening.  She notes that her headaches have much improved.  No active nausea or vomiting since admission. Some orthostatic headache this morning.oon her MRI of the brain late afternoon.  This was reviewed in detail with the patient's daughter at bedside.  Her previous leptomeningeal enhancement appears improved but that is a concerning new spot in the right cerebellum that potentially could represent brain parenchymal progression.  LDH level still within normal limits.  Continues to be an IV fluids.  We are monitoring counts for recovery.overall appears much more comfortable today. Nursing staff and daughter have noted some confusion.  REVIEW OF SYSTEMS:    10 Point review of systems of done and is negative except as noted above.  . Past Medical History  Diagnosis Date  . Cancer (HCC)     cervical cancer  . Burkitt's lymphoma ([place]) 08/29/2015    . Past Surgical History  Procedure Laterality Date  . Cholecystectomy    . Tonsillectomy    . Abdominal hysterectomy    . Abdominal surgery      2-3 rd of colon removed    . Social History  Substance Use Topics  . Smoking status: Never Smoker   . Smokeless tobacco: Never Used  . Alcohol Use: No    ALLERGIES:  is allergic to cabbage; codeine; onion; shellfish allergy; sulfa antibiotics; and zofran.  MEDICATIONS:  Reviewed in EPIC.  PHYSICAL EXAMINATION: ECOG PERFORMANCE STATUS: 2 - Symptomatic, <50% confined to bed  . Filed Vitals:   09/26/15 2325  BP: 112/51  Pulse: 99  Temp: 98.6 F (37 C)  Resp: 20    Filed Weights   09/26/15 0520  Weight: 163 lb  12.8 oz (74.3 kg)  visit  Part  .Body mass index is 27.26 kg/(m^2).  GENERAL:alert,elderly lady, SKIN: no acute skin rashes EYES: normal, conjunctiva are pink and non-injected, sclera clear OROPHARYNX:no exudate, no erythema and lips,oral mucosa is somewhat dry. NECK: supple, no JVD, thyroid normal size, non-tender, without nodularity LYMPH:  no palpable lymphadenopathy in the cervical, axillary or inguinal LUNGS: clear to auscultation with normal respiratory effort HEART: regular rate & rhythm,  no murmurs and no lower extremity edema ABDOMEN: abdomen soft, non-tender, normoactive bowel sounds  Musculoskeletal: no cyanosis of digits and no clubbing  PSYCH: alert & oriented x 3 with fluent speech NEURO: no focal motor/sensory deficits  LABORATORY DATA:   . CBC Latest Ref Rng 09/26/2015 09/25/2015 09/20/2015  WBC 4.0 - 10.5 K/uL 0.4(LL) 0.4(LL) 2.4(L)  Hemoglobin 12.0 - 15.0 g/dL 9.1(L) 9.8(L) 9.2(L)  Hematocrit 36.0 - 46.0 % 26.8(L) 29.0(L) 27.1(L)  Platelets 150 - 400 K/uL 102(L) 117(L) 257    . CMP Latest Ref Rng 09/26/2015 09/25/2015 09/20/2015  Glucose 65 - 99 mg/dL 174(H) 156(H) 101(H)  BUN 6 - 20 mg/dL 11 13.3 17  Creatinine 0.44 - 1.00 mg/dL 0.49 0.6 0.63  Sodium 135 - 145 mmol/L 131(L) 131(L) 137  Potassium 3.5 - 5.1 mmol/L 4.0 4.2 3.9  Chloride 101 - 111 mmol/L 98(L) - 103  CO2 22 - 32 mmol/L 27 26 29   Calcium 8.9 - 10.3 mg/dL 8.3(L) 8.3(L) 8.1(L)  Total Protein  6.4 - 8.3 g/dL - 5.1(L) 4.5(L)  Total Bilirubin 0.20 - 1.20 mg/dL - 0.49 0.7  Alkaline Phos 40 - 150 U/L - 75 59  AST 5 - 34 U/L - <7 13(L)  ALT 0 - 55 U/L - 15 17   . Lab Results  Component Value Date   LDH 181 09/25/2015   RADIOGRAPHIC STUDIES: I have personally reviewed the radiological images as listed and agreed with the findings in the report. Dg Chest 2 View  09/06/2015  CLINICAL DATA:  Onset of weakness last night, generalized weakness throughout body, recently diagnosed with cervical  cancer with first chemotherapy treatment last week, neutropenia, a intermittent midline lower abdominal pain EXAM: CHEST  2 VIEW COMPARISON:  08/18/2015 FINDINGS: RIGHT jugular Port-A-Cath with tip projecting over SVC. Normal heart size, mediastinal contours and pulmonary vascularity. Atherosclerotic calcification aorta. Lungs clear. No pleural effusion or pneumothorax. Bones demineralized. IMPRESSION: No acute abnormalities. Electronically Signed   By: Lavonia Dana M.D.   On: 09/06/2015 14:17   [name] [id number] Contrast  09/26/2015  CLINICAL DATA:  Headaches. Burkitt's lymphoma on chemotherapy. Low back pain. EXAM: MRI HEAD WITHOUT AND WITH CONTRAST TECHNIQUE: Multiplanar, multiecho pulse sequences of the brain and surrounding structures were obtained without and with intravenous contrast. CONTRAST:  32mL MULTIHANCE GADOBENATE DIMEGLUMINE 529 MG/ML IV SOLN COMPARISON:  08/19/2015 FINDINGS: There are 2 small foci of restricted diffusion which measure 5 mm in the right centrum semiovale and 3 mm in the left frontal white matter anterior to the frontal horn, neither of which have associated enhancement. There is an 8 mm focus of hyperintense diffusion weighted signal in the subcortical white matter of the posterior left frontal lobe with normal ADC and no enhancement. There is no evidence of intracranial hemorrhage, mass, midline shift, or extra-axial fluid collection. There is mild generalized cerebral atrophy. Minimal T2 hyperintensities are present in the deep cerebral white matter bilaterally, slightly progressed from the prior study and nonspecific but may be post treatment in nature. There is a new 10 x 4 mm nodular focus of enhancement in the right cerebellum (series 11, image 10 and series 13, image 19) without associated parenchymal edema. No abnormal parenchymal brain enhancement or definite abnormal leptomeningeal enhancement is identified elsewhere. Diffuse smooth pachymeningeal enhancement on the prior  study has decreased/largely resolved, although some of the apparent difference may be accounted for by differences in magnetic field strength between the 2 examinations (3T on the prior study). Prior bilateral cataract extraction is noted. No significant inflammatory changes are identified in the paranasal sinuses or mastoid air cells. Major intracranial vascular flow voids are preserved. IMPRESSION: 1. Three subcentimeter foci of abnormal diffusion-weighted signal in the white matter of both frontal lobes, consistent with acute to subacute small vessel infarcts. 2. New 10 x 4 mm focus of enhancement in the right cerebellum concerning for a nodular leptomeningeal or possibly parenchymal metastasis. 3. Decreased diffuse pachymeningeal enhancement. Electronically Signed   By: Logan Bores M.D.   On: 09/26/2015 15:45   Mr Lumbar Spine Wo Contrast  09/26/2015  CLINICAL DATA:  Low back pain. Headache. History of Burkitt's lymphoma. EXAM: MRI LUMBAR SPINE WITHOUT CONTRAST TECHNIQUE: Multiplanar, multisequence MR imaging of the lumbar spine was performed. No intravenous contrast was administered. COMPARISON:  None. FINDINGS: The vertebral bodies of the lumbar spine are normal in size. The vertebral bodies of the lumbar spine are normal in alignment. There is degenerative disc disease with disc height loss at L4-5 and L5-S1. There  is abnormal marrow lesion in the left side of the sacrum measuring 2.4 x 2.6 cm most consistent lipomatous involvement. There are peripherally T2 hyperintense marrow lesions in the S1, S3 vertebral bodies as well as the posterior elements consistent with areas of lipomatous involvement. Stippled low signal foci throughout the lumbar spine concerning for lymphomatous involvement. The spinal cord is normal in signal and contour. The cord terminates normally at L1 . The nerve roots of the cauda equina and the filum terminale are normal. The visualized portions of the SI joints are unremarkable.  Partially visualized is a retroperitoneal soft tissue mass adjacent to the right psoas muscle measuring at least 5.2 by 3.9 cm. There is an enlarged left para-aortic lymph node measuring 11 mm in short axis. T12-L1: No significant disc bulge. No evidence of neural foraminal stenosis. No central canal stenosis. L1-L2: No significant disc bulge. No evidence of neural foraminal stenosis. No central canal stenosis. Bilateral facet arthropathy. L2-L3: Mild broad-based disc bulge. Mild bilateral facet arthropathy. No evidence of neural foraminal stenosis. No central canal stenosis. L3-L4: Mild broad-based disc bulge. Moderate bilateral facet arthropathy. No evidence of neural foraminal stenosis. No central canal stenosis. L4-L5: Moderate broad-based disc bulge with a more focal right lateral component abutting the right extra foraminal L4 nerve root. No evidence of neural foraminal stenosis. Mild spinal stenosis. L5-S1: Mild broad-based disc bulge with a right lateral disc osteophyte complex. Mild bilateral facet arthropathy. No evidence of neural foraminal stenosis. No central canal stenosis. IMPRESSION: 1. No acute injury of the lumbar spine. 2. Mild lumbar spine spondylosis as described above. 3. Multiple osseous lesions of the sacrum and ilium most concerning for lymphomatous involvement. Stippled low signal foci throughout the lumbar spine concerning for lymphomatous involvement. Electronically Signed   By: Kathreen Devoid   On: 09/26/2015 14:48   Dg Fluoro Guide Lumbar Puncture  09/20/2015  CLINICAL DATA:  81 year old female with lymphoma. Repeat intrathecal chemotherapy injection. Subsequent encounter. EXAM: DIAGNOSTIC LUMBAR PUNCTURE UNDER FLUOROSCOPIC GUIDANCE FLUOROSCOPY TIME:  Radiation Exposure Index (as provided by the fluoroscopic device): If the device does not provide the exposure index: Fluoroscopy Time (in minutes and seconds):  0 minutes 30 seconds Number of Acquired Images:  4 PROCEDURE: Informed  consent was obtained from the patient prior to the procedure, including potential complications of headache, allergy, and pain. A "time-out" was performed. The chemotherapy pack age from the pharmacy was confirmed with the patient name and date of birth. With the patient prone, the lower back was prepped with Betadine. 1% Lidocaine was used for local anesthesia. Lumbar puncture was performed at the L2-L3 level using right sub laminar approach, a 3.5 inch x 20 gauge needle. Initially when the stylet was removed blood was returned. The needle was repositioned, but blood again was returned. At this point a cross-table lateral view was obtained and demonstrated the tip of the needle within the posterior L3 vertebral body. The needle was then withdrawn 1 cm and slightly blood-tinged and cloudy CSF was returned. 4.5 ml of CSF were obtained for laboratory studies. The patient tolerated the procedure well and there were no apparent complications. Several mL of CSF were then held within the tubing to use as flush. The chemotherapy needle was connected to the unused hub of the 3 way stopcock, and the chemotherapy was slowly injected. This was followed by the CSF flush, and then the stylet was reintroduced to the needle. The needle was withdrawn, direct pressure was held, and hemostasis was noted. Appropriate  post procedural orders were placed on the chart. The patient was transported to the ward in stable condition for continued treatment. IMPRESSION: Fluoroscopic guided lumbar puncture with intrathecal injection of chemotherapy at L2-L3. 4.5 mL of slightly blood-tinged, cloudy CSF obtained for laboratory studies prior to injection. Electronically Signed   By: Genevie Ann M.D.   On: 09/20/2015 13:50   Dg Fluoro Guide Lumbar Puncture  09/16/2015  CLINICAL DATA:  Lymphoma, methotrexate injection. EXAM: FLUOROSCOPICALLY GUIDED LUMBAR PUNCTURE FOR INTRATHECAL CHEMOTHERAPY TECHNIQUE: Informed consent was obtained from the patient  prior to the procedure, including potential complications of headache, allergy, and pain. A 'time out' was performed. With the patient prone, the lower back was prepped with Betadine. 1% Lidocaine was used for local anesthesia. Lumbar puncture was performed at the L5-S1 level using a 20 gauge needle with return of clear CSF. 12 mg Of methotrexate was injected into the subarachnoid space. The patient tolerated the procedure well without apparent complication. FLUOROSCOPY TIME:  Fluoroscopy Time (in minutes and seconds): 0 minutes 40 seconds. Number of Acquired Images:  None. IMPRESSION: 1. Intrathecal injection of chemotherapy without complication. 2. Due to extremely slow flow, less than 1 cc CSF was collected for laboratory evaluation. Electronically Signed   By: Lorin Picket M.D.   On: 09/16/2015 16:18   Dg Fluoro Guide Lumbar Puncture  09/09/2015  CLINICAL DATA:  Burkitt's lymphoma with leptomeningeal involvement. Third dose intrathecal chemotherapy EXAM: FLUOROSCOPICALLY GUIDED LUMBAR PUNCTURE FOR INTRATHECAL CHEMOTHERAPY TECHNIQUE: Informed consent was obtained from the patient prior to the procedure, including potential complications of headache, allergy, and pain. A 'time out' was performed. With the patient prone, the lower back was prepped with Betadine. 1% Lidocaine was used for local anesthesia. Lumbar puncture was performed at the L5-S1 using a 22 gauge needle with return of clear CSF. 12 mg methotrexate and 50 mg hydrocortisone pharmacy prepared was injected into the subarachnoid space. The patient tolerated the procedure well without apparent complication. FLUOROSCOPY TIME:  1 minutes 11 seconds IMPRESSION: Intrathecal injection of chemotherapy without complication Electronically Signed   By: Suzy Bouchard M.D.   On: 09/09/2015 11:08   Dg Fluoro Guide Lumbar Puncture  08/30/2015  CLINICAL DATA:  History of Burkitt's lymphoma. Diagnostic lumbar puncture with intrathecal chemotherapy  injection. Subsequent encounter. EXAM: DIAGNOSTIC LUMBAR PUNCTURE UNDER FLUOROSCOPIC GUIDANCE FLUOROSCOPY TIME:  Radiation Exposure Index (as provided by the fluoroscopic device): If the device does not provide the exposure index: Fluoroscopy Time (in minutes and seconds):  44 seconds Number of Acquired Images:  0. PROCEDURE: Informed consent was obtained from the patient prior to the procedure, including potential complications of headache, allergy, and pain. With the patient prone, the lower back was prepped with Betadine. 1% Lidocaine was used for local anesthesia. Lumbar puncture was performed at the L5-S1 level using a 20 gauge needle with return of initially blood tinged but clearing. Fourteen ml of CSF were obtained for laboratory studies. Laboratory prepared methotrexate was injected into the subarachnoid space without difficulty. The patient tolerated the procedure well and there were no apparent complications. IMPRESSION: Diagnostic lumbar puncture and intrathecal injection of methotrexate without complication. Electronically Signed   By: Inge Rise M.D.   On: 08/30/2015 15:44    ASSESSMENT & PLAN:   81 yo caucasian female with   1)Stage IVb Burkitts lymphoma with leptomeningeal involvement. On presentation patient had Retroperitoneal and Posterior Mediastinal Lnadenopathy with significantly elevated LDH and leukoerythroblastic picture on peipheral blood smear with multiple large atypical Lymphocytes. Patient had  significant type B constitutional symptoms which have now resolved . MRI Brain with evidence of leptomeningeal involvement consistent with the patients symptoms of chin numbness though LP was unrevealing. Leptomeningeal enhancement much improved on MRI today but concerning new lesion in the rt cerebellum. BM packed with lymphoma LDH down from 3000's to 1900 to 1215 to 500's to 368 to 352 to 311 to 243 to 181 (today) S/p 2 cycles of daEPOCH-R + IT Methotrexate (weekly for now -  received 5rd dose on 09/20/2015)  2) pancytopenia with significant neutropenia likely related to her chemotherapy.  Patient is likely at the nadir of her counts at this time.  She has received Neulasta on 09/23/2015 and we expect her WBC counts to bounce back soon. No fevers or chills at this time.  3)significant fatigue likely from chemotherapy.  4) dehydration -resolving  5) significant nausea and vomiting since yesterday along with orthostatic headaches and neck pain.  Could be from possible CSF leak from her recent intrathecal chemotherapy.  No overt fevers to suggest an infection or meningitis at this time.  Headaches and neck pains are significantly improved with laying flat in bed which is in favor of CSF leak.   MRI of the brain shows largely resolved leptomeningeal enhancement.  However noted to have 1 small 10x14mm nodular enhancing lesion in the right cerebellum concerning for parenchymal recurrence of her Burkitt's versus treatment related inflammation vs less likely an infectious process. 6) Subcentimeter lesions on MRI ?lacunar infarcts vs treatment related white matter changes. 7)hyponatremia likely related to dehydration and poor solute intake. 8) MRI L spine shows known BM involvement from [facility] -continue isotonic IVF to treat hyponatremia -will restart dexamethasone 4mg  po BID -bed rest today to allow for healing of possible CSF leak. -if persistent orthostatic headaches would typically consider epidurography and epidural blood patch by IR but given her significant neutropenia would like to wait if possible. If absolutely needed might need to consider doing it with antibiotic prophylaxis. -will consult radiation oncology regarding their thoughts. -would typically start the patient on aspirin for suspected lacular infarct but with some epidural intervention/dropping platelet counts would hold off for now. -discussed MRI results with the patients daughter in details at  bedside. -will continue to follow up with patient regarding goals of care. If her systemic disease has responded well as noted by her LDH level and based on planned outpatient rpt PET/CT scan might have some potential benefit from consideration of brain irradiation. -would plan to do a short interval outpatient brain MRI to look for interval change in the rt cerebellar lesion - if Burkitts will grow quite quickly. -holding IT MTX planned for 11/21. -will continue to define of goals of care.  I spent 40 minutes counseling the patient face to face. The total time spent in the appointment was 45 minutes and more than 50% was on counseling and direct patient cares and coordination of care with hospitalist team    Sullivan Lone MD [place] AAHIVMS [facility] Hematology/Oncology Physician [facility]  (Office):       [phone] (Work cell):  [phone] (Fax):           [phone]

## 2015-09-27 NOTE — Progress Notes (Signed)
Echocardiogram 2D Echocardiogram has been performed.  Joelene Millin 09/27/2015, 1:18 PM

## 2015-09-27 NOTE — Care Management Note (Signed)
Case Management Note  Patient Details  Name: Kristen Baker MRN: [id number] Date of Birth: Sep 08, 1933  Subjective/Objective:                 81 yo admitted with Burkitt lymphoma    Action/Plan: From home with services from [facility]  Expected Discharge Date:   (unknown)               Expected Discharge Plan:  [place]  In-House Referral:     Discharge planning Services  CM Consult  Post Acute Care Choice:  Resumption of Svcs/PTA Provider Choice offered to:     DME Arranged:    DME Agency:     HH Arranged:  RN, PT [place] Agency:  [place]  Status of Service:  In process, will continue to follow  Medicare Important Message Given:  Yes Date Medicare IM Given:    Medicare IM give by:    Date Additional Medicare IM Given:    Additional Medicare Important Message give by:     If discussed at [place] of Stay Meetings, dates discussed:    Additional Comments: Pt was receiving HHPT/RN from [facility] prior to admission. Will need MD order to resume services at [facility]. CM will continue to follow.  Lynnell Catalan, RN 09/27/2015, 2:10 PM

## 2015-09-27 NOTE — Progress Notes (Signed)
Follow-up:  Notified by RN regarding MRI results which revealed findings c/w acute infarct as well as possible metastatic lesions. Consulted w/ Neurology who has agreed to evaluate pt tonight but does not feel she needs to be transferred to [facility]. Will f/u neurology recommendations.   Jeryl Columbia, NP-C [facility] Pager [phone]

## 2015-09-27 NOTE — Care Management Important Message (Signed)
Important Message  Patient Details  Name: Kristen Baker MRN: [id number] Date of Birth: 1933-02-02   Medicare Important Message Given:  Yes    Shelda Altes 09/27/2015, 12:23 [place] Message  Patient Details  Name: Kristen Baker MRN: [id number] Date of Birth: 17-Feb-1933   Medicare Important Message Given:  Yes    Shelda Altes 09/27/2015, 12:22 PM

## 2015-09-27 NOTE — Progress Notes (Signed)
[facility] PROGRESS NOTE    Progress Note   Rodina Sanmartin [id number] DOB: March 09, 1933 DOA: 09/25/2015 PCP: Sullivan Lone, MD   Brief Narrative:   Kristen Baker is an 81 y.o. female has medical history of Burkitt's lymphoma on chemotherapy and intrathecal MTX came into the hospital complaining of headache generalized nausea  Assessment/Plan:  Generalized weakness/ Headache due to CVA - MRI of the brain showed multiple acute infarcts, 2-d echo order appreciate neurology assistance. - Continue IV fluids. Phenergan and Compazine for nausea. She relates headache not improved. - Hold asa due to thrombocytopenia, Lipid panel and A1c pending.  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 Going back to the chart LDH has improved significantly. Appriciate neurology assistance, dexamethasone started.  Pancytopenia/neutropenia drug-induced: Likely due to chemotherapy. Mild fever, with mild rise in WBC  Hyponatremia: Cont  IV fluids NS and cont to monitor.  Normocytic anemia: - hbg had a mild drop. - Cont to monitor.     DVT Prophylaxis - Lovenox ordered.  Family Communication: daughter Disposition Plan: Home 3-4 days Code Status:     Code Status Orders        Start     Ordered   09/25/15 1756  Full code   Continuous     09/25/15 1758    Advance Directive Documentation        Most Recent Value   Type of Advance Directive  Living will   Pre-existing out of facility DNR order (yellow form or pink MOST form)     "MOST" Form in Place?          IV Access:    Peripheral IV   Procedures and diagnostic studies:   [name] [id number] Contrast  09/26/2015  CLINICAL DATA:  Headaches. Burkitt's lymphoma on chemotherapy. Low back pain. EXAM: MRI HEAD WITHOUT AND WITH CONTRAST TECHNIQUE: Multiplanar, multiecho pulse sequences of the brain and surrounding structures were obtained without  and with intravenous contrast. CONTRAST:  60mL MULTIHANCE GADOBENATE DIMEGLUMINE 529 MG/ML IV SOLN COMPARISON:  08/19/2015 FINDINGS: There are 2 small foci of restricted diffusion which measure 5 mm in the right centrum semiovale and 3 mm in the left frontal white matter anterior to the frontal horn, neither of which have associated enhancement. There is an 8 mm focus of hyperintense diffusion weighted signal in the subcortical white matter of the posterior left frontal lobe with normal ADC and no enhancement. There is no evidence of intracranial hemorrhage, mass, midline shift, or extra-axial fluid collection. There is mild generalized cerebral atrophy. Minimal T2 hyperintensities are present in the deep cerebral white matter bilaterally, slightly progressed from the prior study and nonspecific but may be post treatment in nature. There is a new 10 x 4 mm nodular focus of enhancement in the right cerebellum (series 11, image 10 and series 13, image 19) without associated parenchymal edema. No abnormal parenchymal brain enhancement or definite abnormal leptomeningeal enhancement is identified elsewhere. Diffuse smooth pachymeningeal enhancement on the prior study has decreased/largely resolved, although some of the apparent difference may be accounted for by differences in magnetic field strength between the 2 examinations (3T on the prior study). Prior bilateral cataract extraction is noted. No significant inflammatory changes are identified in the paranasal sinuses or mastoid air cells. Major intracranial vascular flow voids are preserved. IMPRESSION: 1. Three subcentimeter foci of abnormal diffusion-weighted signal in the white matter of both frontal lobes, consistent with acute to subacute  small vessel infarcts. 2. New 10 x 4 mm focus of enhancement in the right cerebellum concerning for a nodular leptomeningeal or possibly parenchymal metastasis. 3. Decreased diffuse pachymeningeal enhancement. Electronically  Signed   By: Logan Bores M.D.   On: 09/26/2015 15:45   Mr Lumbar Spine Wo Contrast  09/26/2015  CLINICAL DATA:  Low back pain. Headache. History of Burkitt's lymphoma. EXAM: MRI LUMBAR SPINE WITHOUT CONTRAST TECHNIQUE: Multiplanar, multisequence MR imaging of the lumbar spine was performed. No intravenous contrast was administered. COMPARISON:  None. FINDINGS: The vertebral bodies of the lumbar spine are normal in size. The vertebral bodies of the lumbar spine are normal in alignment. There is degenerative disc disease with disc height loss at L4-5 and L5-S1. There is abnormal marrow lesion in the left side of the sacrum measuring 2.4 x 2.6 cm most consistent lipomatous involvement. There are peripherally T2 hyperintense marrow lesions in the S1, S3 vertebral bodies as well as the posterior elements consistent with areas of lipomatous involvement. Stippled low signal foci throughout the lumbar spine concerning for lymphomatous involvement. The spinal cord is normal in signal and contour. The cord terminates normally at L1 . The nerve roots of the cauda equina and the filum terminale are normal. The visualized portions of the SI joints are unremarkable. Partially visualized is a retroperitoneal soft tissue mass adjacent to the right psoas muscle measuring at least 5.2 by 3.9 cm. There is an enlarged left para-aortic lymph node measuring 11 mm in short axis. T12-L1: No significant disc bulge. No evidence of neural foraminal stenosis. No central canal stenosis. L1-L2: No significant disc bulge. No evidence of neural foraminal stenosis. No central canal stenosis. Bilateral facet arthropathy. L2-L3: Mild broad-based disc bulge. Mild bilateral facet arthropathy. No evidence of neural foraminal stenosis. No central canal stenosis. L3-L4: Mild broad-based disc bulge. Moderate bilateral facet arthropathy. No evidence of neural foraminal stenosis. No central canal stenosis. L4-L5: Moderate broad-based disc bulge with a  more focal right lateral component abutting the right extra foraminal L4 nerve root. No evidence of neural foraminal stenosis. Mild spinal stenosis. L5-S1: Mild broad-based disc bulge with a right lateral disc osteophyte complex. Mild bilateral facet arthropathy. No evidence of neural foraminal stenosis. No central canal stenosis. IMPRESSION: 1. No acute injury of the lumbar spine. 2. Mild lumbar spine spondylosis as described above. 3. Multiple osseous lesions of the sacrum and ilium most concerning for lymphomatous involvement. Stippled low signal foci throughout the lumbar spine concerning for lymphomatous involvement. Electronically Signed   By: Kathreen Devoid   On: 09/26/2015 14:48     Medical Consultants:    None.  Anti-Infectives:   Anti-infectives    None      Subjective:    Anton Jain she relates she still has headache is unchanged.  Objective:    Filed Vitals:   09/26/15 1642 09/26/15 1930 09/26/15 2325 09/27/15 0548  BP:  106/44 112/51 98/40  Pulse:  112 99 95  Temp: 100 F (37.8 C) 99.7 F (37.6 C) 98.6 F (37 C) 99.7 F (37.6 C)  TempSrc: Oral Oral Oral Oral  Resp:  20 20 20   Height:      Weight:      SpO2:  93% 94% 94%    Intake/Output Summary (Last 24 hours) at 09/27/15 0649 Last data filed at 09/27/15 0033  Gross per 24 hour  Intake   2413 ml  Output      0 ml  Net   2413 ml  Filed Weights   09/26/15 0520  Weight: 74.3 kg (163 lb 12.8 oz)    Exam: Gen:  NAD Cardiovascular:  RRR, No M/R/G Chest and lungs:   CTAB Abdomen:  Abdomen soft, NT/ND, + BS Extremities:  No C/E/C   Data Reviewed:    Labs: Basic Metabolic Panel:  Recent Labs Lab 09/25/15 1532 09/25/15 1823 09/26/15 0438 09/27/15 0530  NA 131*  --  131* 131*  K 4.2  --  4.0 4.0  CL  --   --  98* 98*  CO2 26  --  27 25  GLUCOSE 156*  --  174* 112*  BUN 13.3  --  11 12  CREATININE 0.6  --  0.49 0.69  CALCIUM 8.3*  --  8.3* 8.1*  MG  --  1.7  --   --     GFR Estimated Creatinine Clearance: 56.6 mL/min (by C-G formula based on Cr of 0.69). Liver Function Tests:  Recent Labs Lab 09/25/15 1532  AST <7  ALT 15  ALKPHOS 75  BILITOT 0.49  PROT 5.1*  ALBUMIN 2.7*   No results for input(s): LIPASE, AMYLASE in the last 168 hours. No results for input(s): AMMONIA in the last 168 hours. Coagulation profile No results for input(s): INR, PROTIME in the last 168 hours.  CBC:  Recent Labs Lab 09/25/15 1532 09/26/15 0438 09/27/15 0530  WBC 0.4* 0.4* 1.4*  HGB 9.8* 9.1* 8.5*  HCT 29.0* 26.8* 25.2*  MCV 90.1 90.8 90.3  PLT 117* 102* 63*   Cardiac Enzymes: No results for input(s): CKTOTAL, CKMB, CKMBINDEX, TROPONINI in the last 168 hours. BNP (last 3 results) No results for input(s): PROBNP in the last 8760 hours. CBG:  Recent Labs Lab 09/25/15 2121 09/26/15 0735 09/26/15 1233 09/26/15 1639 09/26/15 2030  GLUCAP 195* 160* 131* 88 102*   D-Dimer: No results for input(s): DDIMER in the last 72 hours. Hgb A1c: No results for input(s): HGBA1C in the last 72 hours. Lipid Profile: No results for input(s): CHOL, HDL, LDLCALC, TRIG, CHOLHDL, LDLDIRECT in the last 72 hours. Thyroid function studies: No results for input(s): TSH, T4TOTAL, T3FREE, THYROIDAB in the last 72 hours.  Invalid input(s): FREET3 Anemia work up: No results for input(s): VITAMINB12, FOLATE, FERRITIN, TIBC, IRON, RETICCTPCT in the last 72 hours. Sepsis Labs:  Recent Labs Lab 09/25/15 1532 09/26/15 0438 09/27/15 0530  WBC 0.4* 0.4* 1.4*   Microbiology No results found for this or any previous visit (from the past 240 hour(s)).   Medications:   . dexamethasone  4 mg Oral BID WC  . folic acid  1 mg Oral Daily  . insulin aspart  0-9 Units Subcutaneous TID WC  . pantoprazole  40 mg Oral Daily  . polyethylene glycol  17 g Oral Daily  . senna  2 tablet Oral QHS  . sodium chloride  500 mL Intravenous Once   Continuous Infusions: . sodium  chloride 75 mL/hr at 09/26/15 1904    Time spent: 15 min   LOS: 2 days   Charlynne Cousins  [facility] Pager [phone]  *Please refer to [email or web address], password [email or web address] to get updated schedule on who will round on this patient, as hospitalists switch teams weekly. If 7PM-7AM, please contact night-coverage at [email or web address], password [email or web address] for any overnight needs.  09/27/2015, 6:49 AM

## 2015-09-28 DIAGNOSIS — D72819 Decreased white blood cell count, unspecified: Secondary | ICD-10-CM

## 2015-09-28 LAB — CBC WITH DIFFERENTIAL/PLATELET
BAND NEUTROPHILS: 24 %
BASOS PCT: 0 %
Basophils Absolute: 0 10*3/uL (ref 0.0–0.1)
Blasts: 0 %
EOS ABS: 0 10*3/uL (ref 0.0–0.7)
EOS PCT: 0 %
HEMATOCRIT: 31.7 % — AB (ref 36.0–46.0)
Hemoglobin: 10.8 g/dL — ABNORMAL LOW (ref 12.0–15.0)
LYMPHS ABS: 2 10*3/uL (ref 0.7–4.0)
Lymphocytes Relative: 22 %
MCH: 30.3 pg (ref 26.0–34.0)
MCHC: 34.1 g/dL (ref 30.0–36.0)
MCV: 88.8 fL (ref 78.0–100.0)
MONO ABS: 0.9 10*3/uL (ref 0.1–1.0)
MYELOCYTES: 3 %
Metamyelocytes Relative: 5 %
Monocytes Relative: 10 %
NEUTROS PCT: 36 %
NRBC: 1 /100{WBCs} — AB
Neutro Abs: 6.2 10*3/uL (ref 1.7–7.7)
Other: 0 %
Platelets: 61 10*3/uL — ABNORMAL LOW (ref 150–400)
Promyelocytes Absolute: 0 %
RBC: 3.57 MIL/uL — ABNORMAL LOW (ref 3.87–5.11)
RDW: 14.4 % (ref 11.5–15.5)
WBC: 9.1 10*3/uL (ref 4.0–10.5)

## 2015-09-28 LAB — COMPREHENSIVE METABOLIC PANEL
ALBUMIN: 2.5 g/dL — AB (ref 3.5–5.0)
ALK PHOS: 65 U/L (ref 38–126)
ALT: 14 U/L (ref 14–54)
AST: 11 U/L — AB (ref 15–41)
Anion gap: 9 (ref 5–15)
BILIRUBIN TOTAL: 1.1 mg/dL (ref 0.3–1.2)
BUN: 16 mg/dL (ref 6–20)
CO2: 23 mmol/L (ref 22–32)
Calcium: 8.1 mg/dL — ABNORMAL LOW (ref 8.9–10.3)
Chloride: 102 mmol/L (ref 101–111)
Creatinine, Ser: 0.58 mg/dL (ref 0.44–1.00)
GFR calc Af Amer: >60 mL/min (ref >60)
GFR calc non Af Amer: >60 mL/min (ref >60)
GLUCOSE: 124 mg/dL — AB (ref 65–99)
POTASSIUM: 3.6 mmol/L (ref 3.5–5.1)
Sodium: 134 mmol/L — ABNORMAL LOW (ref 135–145)
TOTAL PROTEIN: 4.7 g/dL — AB (ref 6.5–8.1)

## 2015-09-28 LAB — HEMOGLOBIN A1C
Hgb A1c MFr Bld: 6.4 % — ABNORMAL HIGH (ref 4.8–5.6)
Mean Plasma Glucose: 137 mg/dL

## 2015-09-28 LAB — TOXOPLASMA ANTIBODIES- IGG AND  IGM: Toxoplasma Antibody- IgM: <3 AU/mL (ref 0.0–7.9)

## 2015-09-28 LAB — GLUCOSE, CAPILLARY
GLUCOSE-CAPILLARY: 146 mg/dL — AB (ref 65–99)
Glucose-Capillary: 114 mg/dL — ABNORMAL HIGH (ref 65–99)
Glucose-Capillary: 135 mg/dL — ABNORMAL HIGH (ref 65–99)
Glucose-Capillary: 119 mg/dL — ABNORMAL HIGH (ref 65–99)

## 2015-09-28 NOTE — Evaluation (Signed)
Physical Therapy Evaluation Patient Details Name: Kristen Baker MRN: [id number] DOB: 1933-03-07 Today's Date: 09/28/2015   History of Present Illness  81 yo female admitted with Burkitt lymphoma with CNS involvement. Acute/subacute small infarcts, possible mets to cerebellum. Multiple osseous lesions of sacrum/ilium. Hx of cervical cancer.   Clinical Impression  On eval, pt required Min assist for mobility-walked ~115 feet with RW. LOB x 2 during session requiring external assist to prevent fall. No family present during session. At this time, recommend ST [facility] at [facility] unless pt will have 24 hour care at home.     Follow Up Recommendations SNF;Supervision/Assistance - 24 hour (HHPT if 24 hour supervision/assist available)    Equipment Recommendations  None recommended by PT    Recommendations for Other Services       Precautions / Restrictions Precautions Precautions: Fall Restrictions Weight Bearing Restrictions: No      Mobility  Bed Mobility Overal bed mobility: Modified Independent             General bed mobility comments: HOB ~60 degrees. Moderate reliance on bedrail. Increased time.   Transfers Overall transfer level: Needs assistance Equipment used: Rolling walker (2 wheeled) Transfers: Sit to/from Stand Sit to Stand: Min assist;From elevated surface         General transfer comment: Assist to rise, stabilize, control descent. VCs safety, technique,hand placement. Increased time.   Ambulation/Gait Ambulation/Gait assistance: Min assist Ambulation Distance (Feet): 115 Feet Assistive device: Rolling walker (2 wheeled) Gait Pattern/deviations: Step-through pattern;Decreased stride length     General Gait Details: Unsteady. LOB x2 especially with turns/changes in direction. Assist to stabilize and maneuver safely with walker.   Stairs            Wheelchair Mobility    Modified Rankin (Stroke Patients Only)       Balance Overall balance  assessment: Needs assistance         Standing balance support: Bilateral upper extremity supported;During functional activity Standing balance-Leahy Scale: Poor Standing balance comment: needs RW                             Pertinent Vitals/Pain Pain Assessment: No/denies pain    Home Living Family/patient expects to be discharged to:: Other (Comment) (staying with daughter) Living Arrangements: Children   Type of Home: House Home Access: Stairs to enter       Home Equipment: Environmental consultant - 2 wheels;Bedside commode;Shower seat      Prior Function Level of Independence: Independent with assistive device(s);Needs assistance      ADL's / Homemaking Assistance Needed: daughter assists with bathing dressing  Comments: using walker intermittently prior to admission     Hand Dominance        Extremity/Trunk Assessment   Upper Extremity Assessment: Generalized weakness           Lower Extremity Assessment: Generalized weakness      Cervical / Trunk Assessment: Normal  Communication   Communication: No difficulties  Cognition Arousal/Alertness: Awake/alert Behavior During Therapy: WFL for tasks assessed/performed Overall Cognitive Status: Impaired/Different from baseline Area of Impairment: Safety/judgement;Problem solving         Safety/Judgement: Decreased awareness of safety;Decreased awareness of deficits   Problem Solving: Requires verbal cues;Requires tactile cues      General Comments      Exercises        Assessment/Plan    PT Assessment Patient needs continued PT services  PT Diagnosis Difficulty walking;Generalized weakness  PT Problem List Decreased strength;Decreased activity tolerance;Decreased balance;Decreased mobility;Decreased knowledge of use of DME  PT Treatment Interventions DME instruction;Gait training;Functional mobility training;Therapeutic activities;Patient/family education;Balance training;Therapeutic exercise    PT Goals (Current goals can be found in the Care Plan section) Acute Rehab PT Goals Patient Stated Goal: none stated PT Goal Formulation: With patient Time For Goal Achievement: 10/12/15 Potential to Achieve Goals: Fair    Frequency Min 3X/week   Barriers to discharge        Co-evaluation               End of Session Equipment Utilized During Treatment: Gait belt Activity Tolerance: Patient tolerated treatment well Patient left: in chair;with call bell/phone within reach;with chair alarm set           Time: [id number] PT Time Calculation (min) (ACUTE ONLY): 25 min   Charges:   PT Evaluation $Initial PT Evaluation Tier I: 1 Procedure PT Treatments $Gait Training: 8-22 mins   PT G Codes:        Weston Anna, MPT Pager: [phone]

## 2015-09-28 NOTE — Consult Note (Signed)
I have reviewed the patient's chart. The patient has evidence of leptomeningeal disease in the setting of Burkitt's lymphoma - this pattern has improved with chemo but patient had a finding of a worrisome nodular area in right cerebellum. She may be a good candidate for cranial XRT - I will see the patient this weekend and discuss this issue with her and coordinate further outpatient follow-up.  Kyung Rudd, MD

## 2015-09-28 NOTE — Progress Notes (Signed)
[facility] PROGRESS NOTE    Progress Note   Kristen Baker [id number] DOB: [birth year] DOA: 09/25/2015 PCP: Sullivan Lone, MD   Brief Narrative:   Kristen Baker is an 81 y.o. female has medical history of Burkitt's lymphoma on chemotherapy and intrathecal MTX came into the hospital complaining of headache generalized nausea  Assessment/Plan:  Generalized weakness/ Headache due to CVA - MRI of the brain showed multiple acute infarcts, 2-d echo no evidence of thrombosis.  Appreciate neurology assistance. - Continue IV fluids. Phenergan and Compazine for nausea. She relates headache not improved. - Hold asa due to thrombocytopenia, Lipid panel and A1c  Burkitt lymphoma ([place]) with leptomeningeal involvement: Has received 2 cycles of chemotherapy and her B symptoms have improved. Going back to the chart LDH has improved significantly. Appriciate neurology assistance, dexamethasone started. Appreciate D kale help.   Pancytopenia/neutropenia drug-induced: Likely due to chemotherapy. WBC increasing.   Hyponatremia: Cont  IV fluids NS and cont to monitor.  Normocytic anemia: -hb stable at 10.  -no further blood in the stool.  - Cont to monitor.     DVT Prophylaxis - scd due to thrombocytopenia.   Family Communication: daughter Disposition Plan: Home 3-4 days, patient wants to go home at time of discharge. Repeat CBC, monitor for further bleeding   Code Status:     Code Status Orders        Start     Ordered   09/25/15 1756  Full code   Continuous     09/25/15 1758    Advance Directive Documentation        Most Recent Value   Type of Advance Directive  Living will   Pre-existing out of facility DNR order (yellow form or pink MOST form)     "MOST" Form in Place?          IV Access:    Peripheral IV   Procedures and diagnostic studies:   [name] [id number] Contrast  09/26/2015  CLINICAL DATA:  Headaches. Burkitt's lymphoma on chemotherapy. Low back  pain. EXAM: MRI HEAD WITHOUT AND WITH CONTRAST TECHNIQUE: Multiplanar, multiecho pulse sequences of the brain and surrounding structures were obtained without and with intravenous contrast. CONTRAST:  67mL MULTIHANCE GADOBENATE DIMEGLUMINE 529 MG/ML IV SOLN COMPARISON:  08/19/2015 FINDINGS: There are 2 small foci of restricted diffusion which measure 5 mm in the right centrum semiovale and 3 mm in the left frontal white matter anterior to the frontal horn, neither of which have associated enhancement. There is an 8 mm focus of hyperintense diffusion weighted signal in the subcortical white matter of the posterior left frontal lobe with normal ADC and no enhancement. There is no evidence of intracranial hemorrhage, mass, midline shift, or extra-axial fluid collection. There is mild generalized cerebral atrophy. Minimal T2 hyperintensities are present in the deep cerebral white matter bilaterally, slightly progressed from the prior study and nonspecific but may be post treatment in nature. There is a new 10 x 4 mm nodular focus of enhancement in the right cerebellum (series 11, image 10 and series 13, image 19) without associated parenchymal edema. No abnormal parenchymal brain enhancement or definite abnormal leptomeningeal enhancement is identified elsewhere. Diffuse smooth pachymeningeal enhancement on the prior study has decreased/largely resolved, although some of the apparent difference may be accounted for by differences in magnetic field strength between the 2 examinations (3T on the prior study). Prior bilateral cataract extraction is noted. No significant inflammatory changes are identified in the paranasal sinuses or mastoid air  cells. Major intracranial vascular flow voids are preserved. IMPRESSION: 1. Three subcentimeter foci of abnormal diffusion-weighted signal in the white matter of both frontal lobes, consistent with acute to subacute small vessel infarcts. 2. New 10 x 4 mm focus of enhancement in the  right cerebellum concerning for a nodular leptomeningeal or possibly parenchymal metastasis. 3. Decreased diffuse pachymeningeal enhancement. Electronically Signed   By: Logan Bores M.D.   On: 09/26/2015 15:45   Mr Lumbar Spine Wo Contrast  09/26/2015  CLINICAL DATA:  Low back pain. Headache. History of Burkitt's lymphoma. EXAM: MRI LUMBAR SPINE WITHOUT CONTRAST TECHNIQUE: Multiplanar, multisequence MR imaging of the lumbar spine was performed. No intravenous contrast was administered. COMPARISON:  None. FINDINGS: The vertebral bodies of the lumbar spine are normal in size. The vertebral bodies of the lumbar spine are normal in alignment. There is degenerative disc disease with disc height loss at L4-5 and L5-S1. There is abnormal marrow lesion in the left side of the sacrum measuring 2.4 x 2.6 cm most consistent lipomatous involvement. There are peripherally T2 hyperintense marrow lesions in the S1, S3 vertebral bodies as well as the posterior elements consistent with areas of lipomatous involvement. Stippled low signal foci throughout the lumbar spine concerning for lymphomatous involvement. The spinal cord is normal in signal and contour. The cord terminates normally at L1 . The nerve roots of the cauda equina and the filum terminale are normal. The visualized portions of the SI joints are unremarkable. Partially visualized is a retroperitoneal soft tissue mass adjacent to the right psoas muscle measuring at least 5.2 by 3.9 cm. There is an enlarged left para-aortic lymph node measuring 11 mm in short axis. T12-L1: No significant disc bulge. No evidence of neural foraminal stenosis. No central canal stenosis. L1-L2: No significant disc bulge. No evidence of neural foraminal stenosis. No central canal stenosis. Bilateral facet arthropathy. L2-L3: Mild broad-based disc bulge. Mild bilateral facet arthropathy. No evidence of neural foraminal stenosis. No central canal stenosis. L3-L4: Mild broad-based disc  bulge. Moderate bilateral facet arthropathy. No evidence of neural foraminal stenosis. No central canal stenosis. L4-L5: Moderate broad-based disc bulge with a more focal right lateral component abutting the right extra foraminal L4 nerve root. No evidence of neural foraminal stenosis. Mild spinal stenosis. L5-S1: Mild broad-based disc bulge with a right lateral disc osteophyte complex. Mild bilateral facet arthropathy. No evidence of neural foraminal stenosis. No central canal stenosis. IMPRESSION: 1. No acute injury of the lumbar spine. 2. Mild lumbar spine spondylosis as described above. 3. Multiple osseous lesions of the sacrum and ilium most concerning for lymphomatous involvement. Stippled low signal foci throughout the lumbar spine concerning for lymphomatous involvement. Electronically Signed   By: Kathreen Devoid   On: 09/26/2015 14:48     Medical Consultants:    None.  Anti-Infectives:   Anti-infectives    None      Subjective:    Kristen Baker is feeling better. She was able to ambulate in the hall with assistance of PT.  She relates headaches has resolved. No more blood in the stool.   Objective:    Filed Vitals:   09/28/15 0135 09/28/15 0203 09/28/15 0446 09/28/15 1314  BP: 107/46 104/42 119/51 119/44  Pulse: 66 68 67 68  Temp: 97.5 F (36.4 C) 97.4 F (36.3 C) 97.3 F (36.3 C) 97.9 F (36.6 C)  TempSrc: Oral Oral Oral Oral  Resp: 20 17 18 18   Height:      Weight:  SpO2: 95% 94% 95% 93%    Intake/Output Summary (Last 24 hours) at 09/28/15 1417 Last data filed at 09/28/15 1327  Gross per 24 hour  Intake 3881.75 ml  Output    150 ml  Net 3731.75 ml   Filed Weights   09/26/15 0520  Weight: 74.3 kg (163 lb 12.8 oz)    Exam: Gen:  NAD Cardiovascular:  RRR, No M/R/G Chest and lungs:   CTAB Abdomen:  Abdomen soft, NT/ND, + BS Extremities:  No C/E/C   Data Reviewed:    Labs: Basic Metabolic Panel:  Recent Labs Lab 09/25/15 1532  09/25/15 1823  09/26/15 0438 09/27/15 0530 09/28/15 0704  NA 131*  --   --  131* 131* 134*  K 4.2  --   < > 4.0 4.0 3.6  CL  --   --   --  98* 98* 102  CO2 26  --   --  27 25 23   GLUCOSE 156*  --   --  174* 112* 124*  BUN 13.3  --   --  11 12 16   CREATININE 0.6  --   --  0.49 0.69 0.58  CALCIUM 8.3*  --   --  8.3* 8.1* 8.1*  MG  --  1.7  --   --   --   --   < > = values in this interval not displayed. GFR Estimated Creatinine Clearance: 56.6 mL/min (by C-G formula based on Cr of 0.58). Liver Function Tests:  Recent Labs Lab 09/25/15 1532 09/28/15 0704  AST <7 11*  ALT 15 14  ALKPHOS 75 65  BILITOT 0.49 1.1  PROT 5.1* 4.7*  ALBUMIN 2.7* 2.5*   No results for input(s): LIPASE, AMYLASE in the last 168 hours. No results for input(s): AMMONIA in the last 168 hours. Coagulation profile No results for input(s): INR, PROTIME in the last 168 hours.  CBC:  Recent Labs Lab 09/25/15 1532 09/26/15 0438 09/27/15 0530 09/28/15 0704  WBC 0.4* 0.4* 1.4* 9.1  NEUTROABS  --   --   --  6.2  HGB 9.8* 9.1* 8.5* 10.8*  HCT 29.0* 26.8* 25.2* 31.7*  MCV 90.1 90.8 90.3 88.8  PLT 117* 102* 63* 61*   Cardiac Enzymes: No results for input(s): CKTOTAL, CKMB, CKMBINDEX, TROPONINI in the last 168 hours. BNP (last 3 results) No results for input(s): PROBNP in the last 8760 hours. CBG:  Recent Labs Lab 09/27/15 1222 09/27/15 1632 09/27/15 2206 09/28/15 0738 09/28/15 1144  GLUCAP 117* 148* 130* 114* 135*   D-Dimer: No results for input(s): DDIMER in the last 72 hours. Hgb A1c:  Recent Labs  09/27/15 0530  HGBA1C 6.4*   Lipid Profile:  Recent Labs  09/27/15 0530  CHOL 119  HDL 50  LDLCALC 58  TRIG 54  CHOLHDL 2.4   Thyroid function studies: No results for input(s): TSH, T4TOTAL, T3FREE, THYROIDAB in the last 72 hours.  Invalid input(s): FREET3 Anemia work up: No results for input(s): VITAMINB12, FOLATE, FERRITIN, TIBC, IRON, RETICCTPCT in the last 72  hours. Sepsis Labs:  Recent Labs Lab 09/25/15 1532 09/26/15 0438 09/27/15 0530 09/28/15 0704  WBC 0.4* 0.4* 1.4* 9.1   Microbiology No results found for this or any previous visit (from the past 240 hour(s)).   Medications:   . dexamethasone  4 mg Oral BID WC  . folic acid  1 mg Oral Daily  . insulin aspart  0-9 Units Subcutaneous TID WC  . pantoprazole  40 mg Oral  Daily  . senna  2 tablet Oral QHS   Continuous Infusions: . sodium chloride 75 mL/hr at 09/26/15 2011    Time spent: 25 min   LOS: 3 days   Niel Hummer A  [facility] Pager [phone]  Please refer to [email or web address], password [email or web address] to get updated schedule on who will round on this patient, as hospitalists switch teams weekly. If 7PM-7AM, please contact night-coverage at [email or web address], password [email or web address] for any overnight needs.  09/28/2015, 2:17 PM

## 2015-09-28 NOTE — Progress Notes (Signed)
Patient with stool and report painful with defecating. There was some blood mixed with stool. Stool light brown in color. Mild irritation around rectal area. Barrier cream applied. Will monitor

## 2015-09-29 DIAGNOSIS — G44019 Episodic cluster headache, not intractable: Secondary | ICD-10-CM

## 2015-09-29 LAB — CBC WITH DIFFERENTIAL/PLATELET
BASOS ABS: 0 10*3/uL (ref 0.0–0.1)
BLASTS: 0 %
Band Neutrophils: 27 %
Basophils Relative: 0 %
EOS PCT: 0 %
Eosinophils Absolute: 0 10*3/uL (ref 0.0–0.7)
HEMATOCRIT: 32.8 % — AB (ref 36.0–46.0)
HEMOGLOBIN: 11.1 g/dL — AB (ref 12.0–15.0)
LYMPHS ABS: 6 10*3/uL — AB (ref 0.7–4.0)
LYMPHS PCT: 16 %
MCH: 30.3 pg (ref 26.0–34.0)
MCHC: 33.8 g/dL (ref 30.0–36.0)
MCV: 89.6 fL (ref 78.0–100.0)
METAMYELOCYTES PCT: 6 %
MONOS PCT: 0 %
Monocytes Absolute: 0 10*3/uL — ABNORMAL LOW (ref 0.1–1.0)
Myelocytes: 3 %
NEUTROS ABS: 31.4 10*3/uL — AB (ref 1.7–7.7)
NEUTROS PCT: 46 %
NRBC: 0 /100{WBCs}
Other: 0 %
Platelets: 79 10*3/uL — ABNORMAL LOW (ref 150–400)
Promyelocytes Absolute: 2 %
RBC: 3.66 MIL/uL — AB (ref 3.87–5.11)
RDW: 15.2 % (ref 11.5–15.5)
WBC: 37.4 10*3/uL — AB (ref 4.0–10.5)

## 2015-09-29 LAB — BASIC METABOLIC PANEL
ANION GAP: 7 (ref 5–15)
BUN: 18 mg/dL (ref 6–20)
CHLORIDE: 103 mmol/L (ref 101–111)
CO2: 26 mmol/L (ref 22–32)
CREATININE: 0.56 mg/dL (ref 0.44–1.00)
Calcium: 8.3 mg/dL — ABNORMAL LOW (ref 8.9–10.3)
GFR calc non Af Amer: >60 mL/min (ref >60)
GLUCOSE: 119 mg/dL — AB (ref 65–99)
Potassium: 3.7 mmol/L (ref 3.5–5.1)
Sodium: 136 mmol/L (ref 135–145)

## 2015-09-29 LAB — TYPE AND SCREEN
ABO/RH(D): O POS
Antibody Screen: NEGATIVE
UNIT DIVISION: 0
UNIT DIVISION: 0

## 2015-09-29 LAB — GLUCOSE, CAPILLARY
GLUCOSE-CAPILLARY: 116 mg/dL — AB (ref 65–99)
GLUCOSE-CAPILLARY: 119 mg/dL — AB (ref 65–99)
GLUCOSE-CAPILLARY: 114 mg/dL — AB (ref 65–99)
Glucose-Capillary: 99 mg/dL (ref 65–99)

## 2015-09-29 LAB — C DIFFICILE QUICK SCREEN W PCR REFLEX
C DIFFICILE (CDIFF) INTERP: NEGATIVE
C DIFFICLE (CDIFF) ANTIGEN: NEGATIVE
C Diff toxin: NEGATIVE

## 2015-09-29 MED ORDER — ENSURE ENLIVE PO LIQD
237.0000 mL | Freq: Two times a day (BID) | ORAL | Status: DC
  Administered 2015-09-29 – 2015-09-30 (×3): 237 mL via ORAL

## 2015-09-29 NOTE — Progress Notes (Signed)
[facility] PROGRESS NOTE    Progress Note   Kristen Baker [id number] DOB: [birth year] DOA: 09/25/2015 PCP: Sullivan Lone, MD   Brief Narrative:   Kristen Baker is an 81 y.o. female has medical history of Burkitt's lymphoma on chemotherapy and intrathecal MTX came into the hospital complaining of headache generalized nausea  Assessment/Plan:  Generalized weakness/ Headache due to CVA - MRI of the brain showed multiple acute infarcts, 2-d echo no evidence of thrombosis.  Appreciate neurology assistance. - Continue IV fluids. Phenergan and Compazine for nausea. She relates headache not improved. - Hold asa due to thrombocytopenia, LDL 58 and A1c 6.4. Needs diet education.   Burkitt lymphoma ([place]) with leptomeningeal involvement: Has received 2 cycles of chemotherapy and her B symptoms have improved. Going back to the chart LDH has improved significantly. Appriciate neurology assistance, dexamethasone started. Appreciate D kale help.  WBC increase today, might be effect from neulasta11-14 . Repeat labs in am.  Radiation oncology evaluation for brain lesion pending.    Pancytopenia/neutropenia drug-induced: Likely due to chemotherapy. WBC increasing.   Hyponatremia: Cont  IV fluids NS and cont to monitor.  Normocytic anemia: -hb stable at 10.  -no further blood in the stool.  - Cont to monitor.     DVT Prophylaxis - scd due to thrombocytopenia.   Family Communication: daughter Disposition Plan: Home 3-4 days, patient wants to go home at time of discharge. Repeat CBC in am to follow leukocytosis, awaiting radiation oncology evaluation.   Code Status:     Code Status Orders        Start     Ordered   09/25/15 1756  Full code   Continuous     09/25/15 1758    Advance Directive Documentation        Most Recent Value   Type of Advance Directive  Living will   Pre-existing out of facility DNR order (yellow form or pink MOST form)     "MOST" Form in Place?           IV Access:    Peripheral IV   Procedures and diagnostic studies:   No results found.   Medical Consultants:    None.  Anti-Infectives:   Anti-infectives    None      Subjective:    Kristen Baker is feeling better. Poor appetite. No more headaches.   Objective:    Filed Vitals:   09/28/15 0446 09/28/15 1314 09/28/15 2019 09/29/15 0429  BP: 119/51 119/44 113/54 126/59  Pulse: 67 68 71 71  Temp: 97.3 F (36.3 C) 97.9 F (36.6 C) 98.2 F (36.8 C) 97.5 F (36.4 C)  TempSrc: Oral Oral Oral Oral  Resp: 18 18 18 16   Height:      Weight:      SpO2: 95% 93% 96% 96%    Intake/Output Summary (Last 24 hours) at 09/29/15 1209 Last data filed at 09/28/15 2019  Gross per 24 hour  Intake 718.75 ml  Output    400 ml  Net 318.75 ml   Filed Weights   09/26/15 0520  Weight: 74.3 kg (163 lb 12.8 oz)    Exam: Gen:  NAD Cardiovascular:  RRR, No M/R/G Chest and lungs:   CTAB Abdomen:  Abdomen soft, NT/ND, + BS Extremities:  No C/E/C   Data Reviewed:    Labs: Basic Metabolic Panel:  Recent Labs Lab 09/25/15 1532 09/25/15 1823  09/26/15 0438 09/27/15 0530 09/28/15 0704 09/29/15 0550  NA 131*  --   --  131* 131* 134* 136  K 4.2  --   < > 4.0 4.0 3.6 3.7  CL  --   --   --  98* 98* 102 103  CO2 26  --   --  27 25 23 26   GLUCOSE 156*  --   --  174* 112* 124* 119*  BUN 13.3  --   --  11 12 16 18   CREATININE 0.6  --   --  0.49 0.69 0.58 0.56  CALCIUM 8.3*  --   --  8.3* 8.1* 8.1* 8.3*  MG  --  1.7  --   --   --   --   --   < > = values in this interval not displayed. GFR Estimated Creatinine Clearance: 56.6 mL/min (by C-G formula based on Cr of 0.56). Liver Function Tests:  Recent Labs Lab 09/25/15 1532 09/28/15 0704  AST <7 11*  ALT 15 14  ALKPHOS 75 65  BILITOT 0.49 1.1  PROT 5.1* 4.7*  ALBUMIN 2.7* 2.5*   No results for input(s): LIPASE, AMYLASE in the last 168 hours. No results for input(s): AMMONIA in the last 168  hours. Coagulation profile No results for input(s): INR, PROTIME in the last 168 hours.  CBC:  Recent Labs Lab 09/25/15 1532 09/26/15 0438 09/27/15 0530 09/28/15 0704 09/29/15 0550  WBC 0.4* 0.4* 1.4* 9.1 37.4*  NEUTROABS  --   --   --  6.2 31.4*  HGB 9.8* 9.1* 8.5* 10.8* 11.1*  HCT 29.0* 26.8* 25.2* 31.7* 32.8*  MCV 90.1 90.8 90.3 88.8 89.6  PLT 117* 102* 63* 61* 79*   Cardiac Enzymes: No results for input(s): CKTOTAL, CKMB, CKMBINDEX, TROPONINI in the last 168 hours. BNP (last 3 results) No results for input(s): PROBNP in the last 8760 hours. CBG:  Recent Labs Lab 09/28/15 0738 09/28/15 1144 09/28/15 1632 09/28/15 2135 09/29/15 0729  GLUCAP 114* 135* 119* 146* 99   D-Dimer: No results for input(s): DDIMER in the last 72 hours. Hgb A1c:  Recent Labs  09/27/15 0530  HGBA1C 6.4*   Lipid Profile:  Recent Labs  09/27/15 0530  CHOL 119  HDL 50  LDLCALC 58  TRIG 54  CHOLHDL 2.4   Thyroid function studies: No results for input(s): TSH, T4TOTAL, T3FREE, THYROIDAB in the last 72 hours.  Invalid input(s): FREET3 Anemia work up: No results for input(s): VITAMINB12, FOLATE, FERRITIN, TIBC, IRON, RETICCTPCT in the last 72 hours. Sepsis Labs:  Recent Labs Lab 09/26/15 0438 09/27/15 0530 09/28/15 0704 09/29/15 0550  WBC 0.4* 1.4* 9.1 37.4*   Microbiology No results found for this or any previous visit (from the past 240 hour(s)).   Medications:   . dexamethasone  4 mg Oral BID WC  . feeding supplement (ENSURE ENLIVE)  237 mL Oral BID BM  . folic acid  1 mg Oral Daily  . insulin aspart  0-9 Units Subcutaneous TID WC  . pantoprazole  40 mg Oral Daily  . senna  2 tablet Oral QHS   Continuous Infusions: . sodium chloride 45 mL/hr at 09/28/15 2122    Time spent: 25 min   LOS: 4 days   Niel Hummer A  [facility] Pager [phone]  Please refer to [email or web address], password [email or web address] to get updated schedule on who will round on this patient, as  hospitalists switch teams weekly. If 7PM-7AM, please contact night-coverage at [email or web address], password [email or web address] for any overnight needs.  09/29/2015, 12:09 PM

## 2015-09-29 NOTE — Progress Notes (Signed)
Nutrition Follow-up  DOCUMENTATION CODES:   Severe malnutrition in context of chronic illness  INTERVENTION:   -Continue Ensure Enlive po BID, each supplement provides 350 kcal and 20 grams of protein -Encourage PO intake -RD to continue to monitor  NUTRITION DIAGNOSIS:   Malnutrition related to chronic illness as evidenced by percent weight loss, energy intake < or equal to 75% for > or equal to 1 month.  Ongoing.  GOAL:   Patient will meet greater than or equal to 90% of their needs  Not meeting.  MONITOR:   PO intake, Supplement acceptance, Labs, Weight trends, Skin, I & O's  REASON FOR ASSESSMENT:   Consult Poor PO  ASSESSMENT:   81 y.o. female has medical history of Burkitt's lymphoma on chemotherapy and intrathecal MTX came into the hospital complaining of headache generalized nausea  Patient's PO intake varies between 15-100%. Intake has slightly improved since 11/18. Ensures have been ordered. Phenergan has been ordered PRN for nausea.  Labs reviewed: CBGs: 99-146  Diet Order:  Diet regular Room service appropriate?: Yes; Fluid consistency:: Thin  Skin:  Reviewed, no issues  Last BM:  11/18  Height:   Ht Readings from Last 1 Encounters:  09/26/15 5\' 5"  (1.651 m)    Weight:   Wt Readings from Last 1 Encounters:  09/26/15 163 lb 12.8 oz (74.3 kg)    Ideal Body Weight:  56.8 kg  BMI:  Body mass index is 27.26 kg/(m^2).  Estimated Nutritional Needs:   Kcal:  1900-2100  Protein:  105-115g  Fluid:  2L/day  EDUCATION NEEDS:   No education needs identified at this time  Clayton Bibles, MS, RD, LDN Pager: [phone] After Hours Pager: [phone]

## 2015-09-30 ENCOUNTER — Other Ambulatory Visit: Payer: No Typology Code available for payment source

## 2015-09-30 ENCOUNTER — Other Ambulatory Visit: Payer: Self-pay | Admitting: Hematology

## 2015-09-30 ENCOUNTER — Ambulatory Visit: Payer: No Typology Code available for payment source | Admitting: Hematology

## 2015-09-30 ENCOUNTER — Telehealth: Payer: Self-pay | Admitting: *Deleted

## 2015-09-30 ENCOUNTER — Telehealth: Payer: Self-pay | Admitting: Hematology

## 2015-09-30 DIAGNOSIS — D701 Agranulocytosis secondary to cancer chemotherapy: Secondary | ICD-10-CM

## 2015-09-30 DIAGNOSIS — R112 Nausea with vomiting, unspecified: Secondary | ICD-10-CM

## 2015-09-30 DIAGNOSIS — D6481 Anemia due to antineoplastic chemotherapy: Secondary | ICD-10-CM

## 2015-09-30 DIAGNOSIS — R530 Neoplastic (malignant) related fatigue: Secondary | ICD-10-CM

## 2015-09-30 LAB — CBC WITH DIFFERENTIAL/PLATELET
BAND NEUTROPHILS: 7 %
BASOS ABS: 0 10*3/uL (ref 0.0–0.1)
BLASTS: 0 %
Basophils Relative: 0 %
EOS ABS: 0 10*3/uL (ref 0.0–0.7)
Eosinophils Relative: 0 %
HEMATOCRIT: 33.3 % — AB (ref 36.0–46.0)
HEMOGLOBIN: 11.2 g/dL — AB (ref 12.0–15.0)
Lymphocytes Relative: 5 %
Lymphs Abs: 3.4 10*3/uL (ref 0.7–4.0)
MCH: 30.4 pg (ref 26.0–34.0)
MCHC: 33.6 g/dL (ref 30.0–36.0)
MCV: 90.2 fL (ref 78.0–100.0)
METAMYELOCYTES PCT: 6 %
MONO ABS: 3.4 10*3/uL — AB (ref 0.1–1.0)
MONOS PCT: 5 %
MYELOCYTES: 7 %
Neutro Abs: 60.4 10*3/uL — ABNORMAL HIGH (ref 1.7–7.7)
Neutrophils Relative %: 67 %
Other: 0 %
PROMYELOCYTES ABS: 3 %
Platelets: 81 10*3/uL — ABNORMAL LOW (ref 150–400)
RBC: 3.69 MIL/uL — AB (ref 3.87–5.11)
RDW: 15.4 % (ref 11.5–15.5)
WBC: 67.2 10*3/uL — AB (ref 4.0–10.5)
nRBC: 0 /100 WBC

## 2015-09-30 LAB — GLUCOSE, CAPILLARY
GLUCOSE-CAPILLARY: 126 mg/dL — AB (ref 65–99)
Glucose-Capillary: 92 mg/dL (ref 65–99)

## 2015-09-30 LAB — BASIC METABOLIC PANEL
ANION GAP: 5 (ref 5–15)
BUN: 16 mg/dL (ref 6–20)
CALCIUM: 7.9 mg/dL — AB (ref 8.9–10.3)
CHLORIDE: 102 mmol/L (ref 101–111)
CO2: 28 mmol/L (ref 22–32)
CREATININE: 0.63 mg/dL (ref 0.44–1.00)
GFR calc non Af Amer: >60 mL/min (ref >60)
GLUCOSE: 96 mg/dL (ref 65–99)
POTASSIUM: 3.6 mmol/L (ref 3.5–5.1)
Sodium: 135 mmol/L (ref 135–145)

## 2015-09-30 LAB — PATHOLOGIST SMEAR REVIEW

## 2015-09-30 MED ORDER — OXYCODONE HCL 5 MG PO TABS: 5.0000 mg | ORAL_TABLET | ORAL | Status: AC | PRN

## 2015-09-30 MED ORDER — PROCHLORPERAZINE MALEATE 10 MG PO TABS: 10.0000 mg | ORAL_TABLET | Freq: Four times a day (QID) | ORAL | Status: AC | PRN

## 2015-09-30 MED ORDER — DEXAMETHASONE 4 MG PO TABS: 4.0000 mg | ORAL_TABLET | Freq: Every day | ORAL | Status: DC

## 2015-09-30 MED ORDER — POLYETHYLENE GLYCOL 3350 17 G PO PACK: 17.0000 g | PACK | Freq: Every day | ORAL | Status: AC

## 2015-09-30 MED ORDER — FOLIC ACID 1 MG PO TABS: 1.0000 mg | ORAL_TABLET | Freq: Every day | ORAL | Status: AC

## 2015-09-30 MED ORDER — ENSURE ENLIVE PO LIQD: 237.0000 mL | Freq: Two times a day (BID) | ORAL | Status: AC

## 2015-09-30 MED ORDER — TRAMADOL HCL 50 MG PO TABS: 50.0000 mg | ORAL_TABLET | Freq: Four times a day (QID) | ORAL | Status: AC | PRN

## 2015-09-30 NOTE — Care Management Note (Signed)
Case Management Note  Patient Details  Name: Kristen Baker MRN: [id number] Date of Birth: April 23, 1933  Subjective/Objective:        81 yo admitted with Burkitt lymphoma and new brain lesion            Action/Plan: From home with [place] services. Now to DC to SNF.  Expected Discharge Date:   (unknown)               Expected Discharge Plan:  Skilled Nursing Facility  In-House Referral:  Clinical Social Work  Discharge planning Services  CM Consult  Post Acute Care Choice:    Choice offered to:     DME Arranged:    DME Agency:     HH Arranged:    [place] Agency:     Status of Service:  Completed, signed off  Medicare Important Message Given:  Yes Date Medicare IM Given:    Medicare IM give by:    Date Additional Medicare IM Given:    Additional Medicare Important Message give by:     If discussed at [place] of Stay Meetings, dates discussed:    Additional Comments: This CM met with pt, daughter and CSW at bedside for disposition planning. Pt is stating that she would like to go to SNF prior to going home. CSW faxing out information. CM will alert [facility].  Lynnell Catalan, RN 09/30/2015, 12:48 PM

## 2015-09-30 NOTE — Discharge Summary (Addendum)
Physician Discharge Summary  Kristen Baker MRN: [id number] DOB/AGE: 79/26/1934 79 y.o.  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)   Cerebrovascular accident (CVA) due to bilateral embolism of carotid arteries   Symptomatic anemia    Follow-up recommendations Follow-up with PCP in 3-5 days , including all  additional recommended appointments as below Follow-up CBC, CMP in 3-5 days Patient to follow-up with oncology and radiation oncology for further treatment planning Patient being discharged to skilled nursing facility     Medication List    STOP taking these medications        PRESCRIPTION MEDICATION      TAKE these medications        dexamethasone 4 MG tablet  Commonly known as:  DECADRON  Take 1 tablet (4 mg total) by mouth daily.     feeding supplement (ENSURE ENLIVE) Liqd  Take 237 mLs by mouth 2 (two) times daily between meals.     folic acid 1 MG tablet  Commonly known as:  FOLVITE  Take 1 tablet (1 mg total) by mouth daily.     omeprazole 40 MG capsule  Commonly known as:  PRILOSEC  Take 40 mg by mouth daily.     oxyCODONE 5 MG immediate release tablet  Commonly known as:  ROXICODONE  Take 1 tablet (5 mg total) by mouth every 4 (four) hours as needed for severe pain.     polyethylene glycol packet  Commonly known as:  MIRALAX / GLYCOLAX  Take 17 g by mouth daily. Hold for diarrhea     prochlorperazine 10 MG tablet  Commonly known as:  COMPAZINE  Take 1 tablet (10 mg total) by mouth every 6 (six) hours as needed for nausea or vomiting.     senna 8.6 MG Tabs tablet  Commonly known as:  SENOKOT  Take 2 tablets (17.2 mg total) by mouth at bedtime.     traMADol 50 MG tablet  Commonly known as:  ULTRAM   Take 1 tablet (50 mg total) by mouth every 6 (six) hours as needed for moderate pain.     Vitamin D (Ergocalciferol) 50000 UNITS Caps capsule  Commonly known as:  DRISDOL  Take 50,000 Units by mouth every 7 (seven) days. Take on Tuesdays         Discharge Condition: Prognosis guarded   Discharge Instructions       Discharge Instructions    Type and screen    Complete by:  Sep 27, 2015   [place] order/instruction    Complete by:  As directed   Transfuse Parameters     Complete patient signature process for consent form    Complete by:  As directed      Diet - low sodium heart healthy    Complete by:  As directed      Increase activity slowly    Complete by:  As directed      Practitioner attestation of consent    Complete by:  As directed   I, the ordering practitioner, attest that I have discussed with the patient the benefits, risks, side effects, alternatives, likelihood of achieving goals and potential problems during recovery for the procedure listed.  Procedure:  Blood Product(s)           Allergies  Allergen Reactions  . Cabbage     unknown  . Codeine Nausea Only    Nausea, headache  . Onion     unknown  . Shellfish Allergy     Cant breath   . Sulfa Antibiotics     Rash   . Zofran [Ondansetron Hcl] Nausea And Vomiting      Disposition: 06-Home-Health Care Svc   Consults:  Oncology Radiation oncology     Significant Diagnostic Studies:  Dg Chest 2 View  09/06/2015  CLINICAL DATA:  Onset of weakness last night, generalized weakness throughout body, recently diagnosed with cervical cancer with first chemotherapy treatment last week, neutropenia, a intermittent midline lower abdominal pain EXAM: CHEST  2 VIEW COMPARISON:  08/18/2015 FINDINGS: RIGHT jugular Port-A-Cath with tip projecting over SVC. Normal heart size, mediastinal contours and pulmonary vascularity. Atherosclerotic calcification aorta. Lungs clear.  No pleural effusion or pneumothorax. Bones demineralized. IMPRESSION: No acute abnormalities. Electronically Signed   By: Lavonia Dana M.D.   On: 09/06/2015 14:17   [name] WC Contrast  09/26/2015  CLINICAL DATA:  Headaches. Burkitt's lymphoma on chemotherapy. Low back pain. EXAM: MRI HEAD WITHOUT AND WITH CONTRAST TECHNIQUE: Multiplanar, multiecho pulse sequences of the brain and surrounding structures were obtained without and with intravenous contrast. CONTRAST:  51mL MULTIHANCE GADOBENATE DIMEGLUMINE 529 MG/ML IV SOLN COMPARISON:  08/19/2015 FINDINGS: There are 2 small foci of restricted diffusion which measure 5 mm in the right centrum semiovale and 3 mm in the left frontal white matter anterior to the frontal horn, neither of which have associated enhancement. There is an 8 mm focus of hyperintense diffusion weighted signal in the subcortical white matter of the posterior left frontal lobe with normal ADC and no enhancement. There is no evidence of intracranial hemorrhage, mass, midline shift, or extra-axial fluid collection. There is mild generalized cerebral atrophy. Minimal T2 hyperintensities are present in the deep cerebral white matter bilaterally, slightly progressed from the prior study and nonspecific but may be post treatment in nature. There is a new 10 x 4 mm nodular focus of enhancement in the right cerebellum (series 11, image 10 and series 13, image 19) without associated parenchymal edema. No abnormal parenchymal brain enhancement or definite abnormal leptomeningeal enhancement is identified elsewhere. Diffuse smooth pachymeningeal enhancement on the prior study has decreased/largely resolved, although some of the apparent difference may be accounted for by differences in magnetic field strength between the 2 examinations (3T on the prior study). Prior bilateral cataract extraction is noted. No significant inflammatory changes are identified in the paranasal sinuses or mastoid air cells.  Major intracranial vascular flow voids are preserved. IMPRESSION: 1. Three subcentimeter foci of abnormal diffusion-weighted signal in the white matter of both frontal lobes, consistent with acute to subacute small vessel infarcts. 2. New 10 x 4 mm focus of enhancement in the right cerebellum concerning for a nodular leptomeningeal or possibly parenchymal metastasis. 3. Decreased diffuse pachymeningeal enhancement. Electronically Signed   By: Logan Bores M.D.   On: 09/26/2015 15:45   Mr Lumbar Spine Wo Contrast  09/26/2015  CLINICAL DATA:  Low back pain. Headache. History of Burkitt's lymphoma. EXAM: MRI LUMBAR SPINE WITHOUT CONTRAST TECHNIQUE: Multiplanar, multisequence MR imaging of the lumbar spine was performed. No intravenous contrast was administered. COMPARISON:  None. FINDINGS: The vertebral bodies of the lumbar spine are normal in size. The vertebral bodies of the lumbar spine are  normal in alignment. There is degenerative disc disease with disc height loss at L4-5 and L5-S1. There is abnormal marrow lesion in the left side of the sacrum measuring 2.4 x 2.6 cm most consistent lipomatous involvement. There are peripherally T2 hyperintense marrow lesions in the S1, S3 vertebral bodies as well as the posterior elements consistent with areas of lipomatous involvement. Stippled low signal foci throughout the lumbar spine concerning for lymphomatous involvement. The spinal cord is normal in signal and contour. The cord terminates normally at L1 . The nerve roots of the cauda equina and the filum terminale are normal. The visualized portions of the SI joints are unremarkable. Partially visualized is a retroperitoneal soft tissue mass adjacent to the right psoas muscle measuring at least 5.2 by 3.9 cm. There is an enlarged left para-aortic lymph node measuring 11 mm in short axis. T12-L1: No significant disc bulge. No evidence of neural foraminal stenosis. No central canal stenosis. L1-L2: No significant disc  bulge. No evidence of neural foraminal stenosis. No central canal stenosis. Bilateral facet arthropathy. L2-L3: Mild broad-based disc bulge. Mild bilateral facet arthropathy. No evidence of neural foraminal stenosis. No central canal stenosis. L3-L4: Mild broad-based disc bulge. Moderate bilateral facet arthropathy. No evidence of neural foraminal stenosis. No central canal stenosis. L4-L5: Moderate broad-based disc bulge with a more focal right lateral component abutting the right extra foraminal L4 nerve root. No evidence of neural foraminal stenosis. Mild spinal stenosis. L5-S1: Mild broad-based disc bulge with a right lateral disc osteophyte complex. Mild bilateral facet arthropathy. No evidence of neural foraminal stenosis. No central canal stenosis. IMPRESSION: 1. No acute injury of the lumbar spine. 2. Mild lumbar spine spondylosis as described above. 3. Multiple osseous lesions of the sacrum and ilium most concerning for lymphomatous involvement. Stippled low signal foci throughout the lumbar spine concerning for lymphomatous involvement. Electronically Signed   By: Kathreen Devoid   On: 09/26/2015 14:48   Dg Fluoro Guide Lumbar Puncture  09/20/2015  CLINICAL DATA:  81 year old female with lymphoma. Repeat intrathecal chemotherapy injection. Subsequent encounter. EXAM: DIAGNOSTIC LUMBAR PUNCTURE UNDER FLUOROSCOPIC GUIDANCE FLUOROSCOPY TIME:  Radiation Exposure Index (as provided by the fluoroscopic device): If the device does not provide the exposure index: Fluoroscopy Time (in minutes and seconds):  0 minutes 30 seconds Number of Acquired Images:  4 PROCEDURE: Informed consent was obtained from the patient prior to the procedure, including potential complications of headache, allergy, and pain. A "time-out" was performed. The chemotherapy pack age from the pharmacy was confirmed with the patient name and date of birth. With the patient prone, the lower back was prepped with Betadine. 1% Lidocaine was used  for local anesthesia. Lumbar puncture was performed at the L2-L3 level using right sub laminar approach, a 3.5 inch x 20 gauge needle. Initially when the stylet was removed blood was returned. The needle was repositioned, but blood again was returned. At this point a cross-table lateral view was obtained and demonstrated the tip of the needle within the posterior L3 vertebral body. The needle was then withdrawn 1 cm and slightly blood-tinged and cloudy CSF was returned. 4.5 ml of CSF were obtained for laboratory studies. The patient tolerated the procedure well and there were no apparent complications. Several mL of CSF were then held within the tubing to use as flush. The chemotherapy needle was connected to the unused hub of the 3 way stopcock, and the chemotherapy was slowly injected. This was followed by the CSF flush, and then the stylet was  reintroduced to the needle. The needle was withdrawn, direct pressure was held, and hemostasis was noted. Appropriate post procedural orders were placed on the chart. The patient was transported to the ward in stable condition for continued treatment. IMPRESSION: Fluoroscopic guided lumbar puncture with intrathecal injection of chemotherapy at L2-L3. 4.5 mL of slightly blood-tinged, cloudy CSF obtained for laboratory studies prior to injection. Electronically Signed   By: Genevie Ann M.D.   On: 09/20/2015 13:50   Dg Fluoro Guide Lumbar Puncture  09/16/2015  CLINICAL DATA:  Lymphoma, methotrexate injection. EXAM: FLUOROSCOPICALLY GUIDED LUMBAR PUNCTURE FOR INTRATHECAL CHEMOTHERAPY TECHNIQUE: Informed consent was obtained from the patient prior to the procedure, including potential complications of headache, allergy, and pain. A 'time out' was performed. With the patient prone, the lower back was prepped with Betadine. 1% Lidocaine was used for local anesthesia. Lumbar puncture was performed at the L5-S1 level using a 20 gauge needle with return of clear CSF. 12 mg Of  methotrexate was injected into the subarachnoid space. The patient tolerated the procedure well without apparent complication. FLUOROSCOPY TIME:  Fluoroscopy Time (in minutes and seconds): 0 minutes 40 seconds. Number of Acquired Images:  None. IMPRESSION: 1. Intrathecal injection of chemotherapy without complication. 2. Due to extremely slow flow, less than 1 cc CSF was collected for laboratory evaluation. Electronically Signed   By: Lorin Picket M.D.   On: 09/16/2015 16:18   Dg Fluoro Guide Lumbar Puncture  09/09/2015  CLINICAL DATA:  Burkitt's lymphoma with leptomeningeal involvement. Third dose intrathecal chemotherapy EXAM: FLUOROSCOPICALLY GUIDED LUMBAR PUNCTURE FOR INTRATHECAL CHEMOTHERAPY TECHNIQUE: Informed consent was obtained from the patient prior to the procedure, including potential complications of headache, allergy, and pain. A 'time out' was performed. With the patient prone, the lower back was prepped with Betadine. 1% Lidocaine was used for local anesthesia. Lumbar puncture was performed at the L5-S1 using a 22 gauge needle with return of clear CSF. 12 mg methotrexate and 50 mg hydrocortisone pharmacy prepared was injected into the subarachnoid space. The patient tolerated the procedure well without apparent complication. FLUOROSCOPY TIME:  1 minutes 11 seconds IMPRESSION: Intrathecal injection of chemotherapy without complication Electronically Signed   By: Suzy Bouchard M.D.   On: 09/09/2015 11:08        Filed Weights   09/26/15 0520  Weight: 74.3 kg (163 lb 12.8 oz)     Microbiology: Recent Results (from the past 240 hour(s))  C difficile quick scan w PCR reflex     Status: None   Collection Time: 09/29/15  2:29 PM  Result Value Ref Range Status   C Diff antigen NEGATIVE NEGATIVE Final   C Diff toxin NEGATIVE NEGATIVE Final   C Diff interpretation NEGATIVE  Final       Blood Culture    Component Value Date/Time   SDES CSF 08/26/2015 1422   SPECREQUEST NONE  08/26/2015 1422   CULT NO GROWTH 3 DAYS 08/20/2015 1655   REPTSTATUS 08/26/2015 FINAL 08/26/2015 1422      Labs: Results for orders placed or performed during the hospital encounter of 09/25/15 (from the past 48 hour(s))  Glucose, capillary     Status: Abnormal   Collection Time: 09/28/15 11:44 AM  Result Value Ref Range   Glucose-Capillary 135 (H) 65 - 99 mg/dL  Glucose, capillary     Status: Abnormal   Collection Time: 09/28/15  4:32 PM  Result Value Ref Range   Glucose-Capillary 119 (H) 65 - 99 mg/dL  Glucose, capillary  Status: Abnormal   Collection Time: 09/28/15  9:35 PM  Result Value Ref Range   Glucose-Capillary 146 (H) 65 - 99 mg/dL   Comment 1 Notify RN   CBC with Differential/Platelet     Status: Abnormal   Collection Time: 09/29/15  5:50 AM  Result Value Ref Range   WBC 37.4 (H) 4.0 - 10.5 K/uL    Comment: WHITE COUNT CONFIRMED ON SMEAR   RBC 3.66 (L) 3.87 - 5.11 MIL/uL   Hemoglobin 11.1 (L) 12.0 - 15.0 g/dL   HCT 32.8 (L) 36.0 - 46.0 %   MCV 89.6 78.0 - 100.0 fL   MCH 30.3 26.0 - 34.0 pg   MCHC 33.8 30.0 - 36.0 g/dL   RDW 15.2 11.5 - 15.5 %   Platelets 79 (L) 150 - 400 K/uL    Comment: RESULT REPEATED AND VERIFIED PLATELET COUNT CONFIRMED BY SMEAR    Neutrophils Relative % 46 %   Lymphocytes Relative 16 %   Monocytes Relative 0 %   Eosinophils Relative 0 %   Basophils Relative 0 %   Band Neutrophils 27 %   Metamyelocytes Relative 6 %   Myelocytes 3 %   Promyelocytes Absolute 2 %   Blasts 0 %   nRBC 0 0 /100 WBC   Other 0 %   Neutro Abs 31.4 (H) 1.7 - 7.7 K/uL   Lymphs Abs 6.0 (H) 0.7 - 4.0 K/uL   Monocytes Absolute 0.0 (L) 0.1 - 1.0 K/uL   Eosinophils Absolute 0.0 0.0 - 0.7 K/uL   Basophils Absolute 0.0 0.0 - 0.1 K/uL   RBC Morphology POLYCHROMASIA PRESENT     Comment: RARE NRBCs   WBC Morphology      MODERATE LEFT SHIFT (>5% METAS AND MYELOS,OCC PRO NOTED)    Comment: ATYPICAL LYMPHOCYTES TOXIC GRANULATION DOHLE BODIES INCREASED BANDS  (>20% BANDS)   Basic metabolic panel     Status: Abnormal   Collection Time: 09/29/15  5:50 AM  Result Value Ref Range   Sodium 136 135 - 145 mmol/L   Potassium 3.7 3.5 - 5.1 mmol/L   Chloride 103 101 - 111 mmol/L   CO2 26 22 - 32 mmol/L   Glucose, Bld 119 (H) 65 - 99 mg/dL   BUN 18 6 - 20 mg/dL   Creatinine, Ser 0.56 0.44 - 1.00 mg/dL   Calcium 8.3 (L) 8.9 - 10.3 mg/dL   GFR calc non Af Amer >60 >60 mL/min   GFR calc Af Amer >60 >60 mL/min    Comment: (NOTE) The eGFR has been calculated using the CKD EPI equation. This calculation has not been validated in all clinical situations. eGFR's persistently <60 mL/min signify possible Chronic Kidney Disease.    Anion gap 7 5 - 15  Glucose, capillary     Status: None   Collection Time: 09/29/15  7:29 AM  Result Value Ref Range   Glucose-Capillary 99 65 - 99 mg/dL  Glucose, capillary     Status: Abnormal   Collection Time: 09/29/15 12:21 PM  Result Value Ref Range   Glucose-Capillary 116 (H) 65 - 99 mg/dL  C difficile quick scan w PCR reflex     Status: None   Collection Time: 09/29/15  2:29 PM  Result Value Ref Range   C Diff antigen NEGATIVE NEGATIVE   C Diff toxin NEGATIVE NEGATIVE   C Diff interpretation NEGATIVE   Glucose, capillary     Status: Abnormal   Collection Time: 09/29/15  4:46 PM  Result  Value Ref Range   Glucose-Capillary 119 (H) 65 - 99 mg/dL  Glucose, capillary     Status: Abnormal   Collection Time: 09/29/15  9:23 PM  Result Value Ref Range   Glucose-Capillary 114 (H) 65 - 99 mg/dL   Comment 1 Notify RN   CBC with Differential/Platelet     Status: Abnormal   Collection Time: 09/30/15  4:15 AM  Result Value Ref Range   WBC 67.2 (HH) 4.0 - 10.5 K/uL    Comment: WHITE COUNT CONFIRMED ON SMEAR REPEATED TO VERIFY CRITICAL RESULT CALLED TO, READ BACK BY AND VERIFIED WITH: J. CLARK RN AT 0630 ON 11.21.16 BY SHUEA    RBC 3.69 (L) 3.87 - 5.11 MIL/uL   Hemoglobin 11.2 (L) 12.0 - 15.0 g/dL   HCT 33.3 (L) 36.0 -  46.0 %   MCV 90.2 78.0 - 100.0 fL   MCH 30.4 26.0 - 34.0 pg   MCHC 33.6 30.0 - 36.0 g/dL   RDW 15.4 11.5 - 15.5 %   Platelets 81 (L) 150 - 400 K/uL    Comment: SPECIMEN CHECKED FOR CLOTS REPEATED TO VERIFY PLATELET COUNT CONFIRMED BY SMEAR    Neutrophils Relative % 67 %   Lymphocytes Relative 5 %   Monocytes Relative 5 %   Eosinophils Relative 0 %   Basophils Relative 0 %   Band Neutrophils 7 %   Metamyelocytes Relative 6 %   Myelocytes 7 %   Promyelocytes Absolute 3 %   Blasts 0 %   nRBC 0 0 /100 WBC   Other 0 %   WBC Morphology      MARKED LEFT SHIFT (>5% METAS,MYELOS AND PROS, OCC BLAST NOTED)    Comment: DOHLE BODIES TOXIC GRANULATION    Neutro Abs 60.4 (H) 1.7 - 7.7 K/uL   Lymphs Abs 3.4 0.7 - 4.0 K/uL   Monocytes Absolute 3.4 (H) 0.1 - 1.0 K/uL   Eosinophils Absolute 0.0 0.0 - 0.7 K/uL   Basophils Absolute 0.0 0.0 - 0.1 K/uL  Basic metabolic panel     Status: Abnormal   Collection Time: 09/30/15  4:15 AM  Result Value Ref Range   Sodium 135 135 - 145 mmol/L   Potassium 3.6 3.5 - 5.1 mmol/L   Chloride 102 101 - 111 mmol/L   CO2 28 22 - 32 mmol/L   Glucose, Bld 96 65 - 99 mg/dL   BUN 16 6 - 20 mg/dL   Creatinine, Ser 0.63 0.44 - 1.00 mg/dL   Calcium 7.9 (L) 8.9 - 10.3 mg/dL   GFR calc non Af Amer >60 >60 mL/min   GFR calc Af Amer >60 >60 mL/min    Comment: (NOTE) The eGFR has been calculated using the CKD EPI equation. This calculation has not been validated in all clinical situations. eGFR's persistently <60 mL/min signify possible Chronic Kidney Disease.    Anion gap 5 5 - 15  Glucose, capillary     Status: None   Collection Time: 09/30/15  7:34 AM  Result Value Ref Range   Glucose-Capillary 92 65 - 99 mg/dL   Comment 1 Notify RN      Lipid Panel     Component Value Date/Time   CHOL 119 09/27/2015 0530   TRIG 54 09/27/2015 0530   HDL 50 09/27/2015 0530   CHOLHDL 2.4 09/27/2015 0530   VLDL 11 09/27/2015 0530   LDLCALC 58 09/27/2015 0530      Lab Results  Component Value Date   HGBA1C 6.4* 09/27/2015  Lab Results  Component Value Date   LDLCALC 58 09/27/2015   CREATININE 0.63 09/30/2015     HPI 81  y.o. female recently diagnosed with Burkitt's lymphoma and started on EPOCH-R which she has received 2 cycles with intrathecal mature tracks 8 and hydrocortisone for left lip to meningeal involvement. Went to her oncologist office reporting generalized weakness headache and nausea. She relates this all started about 2 days prior to admission she has been able to keep only minimal fluids. She denies any fever, photophobia or focal symptoms. She denies any sick contacts any diarrhea, cough, chest pain or shortness of breath. She relates no cough with eating or drinking. She relates she has been tolerating her chemotherapy well and has been tapered down on her steroids. She also relates that her headache is worse with sitting up  HOSPITAL COURSE: Generalized weakness/ Headache due to leptomeningeal enhancement MRI of the brain shows largely resolved leptomeningeal enhancement. However noted to have 1 small 10x73mm nodular enhancing lesion in the right cerebellum concerning for parenchymal recurrence of her Burkitt's versus treatment related inflammation vs less likely an infectious process. subcentimeter lesions on MRI ?lacunar infarcts vs treatment related white matter changes.  2-d echo no evidence of thrombosis.  - Symptoms of nausea secondary to chemotherapy, improved with Phenergan and Compazine .  - Hold asa due to thrombocytopenia, platelet count of 81, doubt CVA LDL 58 and A1c 6.4. Needs diet education.  Patient also receiving intrathecal chemotherapy, and as per oncology the patient's headache could have been from a possible CSF leak from her recent intrathecal chemotherapy, no overt signs of infection or meningitis at this time   Burkitt lymphoma [facility]) with leptomeningeal involvement: Has received 3 cycles of  chemotherapy and her B symptoms have improved. Going back to the chart LDH has improved significantly. Patient started on Decadron by oncology Patient and family would like to hold off on chemotherapy and see how she feels over the next 2 weeks off chemotherapy treatment Okay with further imaging such as a repeat MRI as well as a PET scan Patient to follow-up with oncology/radiation oncology WBC around 67,000 from Neulasta that she received on 11/14.  Radiation oncology consulted , she may benefit from cranial radiation therapy, they recommend 3T MRI scan that would allow radiation treatment planning, based on the results   Pancytopenia/neutropenia drug-induced: Likely due to chemotherapy. WBC greater than 60 7K on 11/21   Hyponatremia: Resolved, sodium 135 on the day of discharge  Normocytic anemia: -hb 11.2 on the day of discharge  -no further blood in the stool.  - Cont to monitor.    DVT Prophylaxis - scd due to thrombocytopenia.     Discharge Exam:   Blood pressure 130/56, pulse 62, temperature 97.5 F (36.4 C), temperature source Oral, resp. rate 16, height $RemoveBe'5\' 5"'sKcROSUCj$  (1.651 m), weight 74.3 kg (163 lb 12.8 oz), SpO2 94 %. Gen: NAD Cardiovascular: RRR, No M/R/G Chest and lungs: CTAB Abdomen: Abdomen soft, NT/ND, + BS Extremities: No C/E/C     Follow-up Information    Follow up with Sullivan Lone, MD. Schedule an appointment as soon as possible for a visit in 3 days.   Specialties:  Hematology, Oncology   Contact information:   [place] [address] [phone]       Follow up with Kyung Rudd, MD. Schedule an appointment as soon as possible for a visit in 3 days.   Specialty:  Radiation Oncology   Contact information:  [address] [place] [address] [phone]       Signed: Reyne Dumas 09/30/2015, 10:48 AM        Time spent >45 mins

## 2015-09-30 NOTE — Clinical Social Work Placement (Signed)
   CLINICAL SOCIAL WORK PLACEMENT  NOTE  Date:  09/30/2015  Patient Details  Name: Kristen Baker MRN: [id number] Date of Birth: Dec 12, 1932  Clinical Social Work is seeking post-discharge placement for this patient at the [place] level of care (*CSW will initial, date and re-position this form in  chart as items are completed):  Yes   Patient/family provided with [facility] Work Department's list of facilities offering this level of care within the geographic area requested by the patient (or if unable, by the patient's family).  Yes   Patient/family informed of their freedom to choose among providers that offer the needed level of care, that participate in Medicare, Medicaid or managed care program needed by the patient, have an available bed and are willing to accept the patient.  Yes   Patient/family informed of [facility]'s ownership interest in [facility] and [facility], as well as of the fact that they are under no obligation to receive care at these facilities.  PASRR submitted to EDS on 09/30/15     PASRR number received on 09/30/15     Existing PASRR number confirmed on       FL2 transmitted to all facilities in geographic area requested by pt/family on 09/30/15     FL2 transmitted to all facilities within larger geographic area on       Patient informed that his/her managed care company has contracts with or will negotiate with certain facilities, including the following:        Yes   Patient/family informed of bed offers received.  Patient chooses bed at [facility]     Physician recommends and patient chooses bed at      Patient to be transferred to [facility] on 09/30/15.  Patient to be transferred to facility by pt daughter via ambulance      Patient family notified on 09/30/15 of transfer.  Name of family member notified:  pt and pt daughter, [name] notified at bedside     PHYSICIAN Please sign FL2      Additional Comment:    _______________________________________________ Ladell Pier, LCSW 09/30/2015, 1:42 PM

## 2015-09-30 NOTE — Clinical Social Work Note (Signed)
Clinical Social Work Assessment  Patient Details  Name: Trinady Milewski MRN: [id number] Date of Birth: December 06, 1932  Date of referral:  09/30/15               Reason for consult:  Discharge Planning                Permission sought to share information with:  Family Supports Permission granted to share information::  Yes, Verbal Permission Granted  Name::     Rosalee Kaufman  Agency::     Relationship::  daughter  Contact Information:  [phone]  Housing/Transportation Living arrangements for the past 2 months:  [place] of Information:  Patient, Adult Children Patient Interpreter Needed:  None Criminal Activity/Legal Involvement Pertinent to Current Situation/Hospitalization:  No - Comment as needed Significant Relationships:  Adult Children Lives with:  Adult Children Do you feel safe going back to the place where you live?  No (pt wanting short term rehab) Need for family participation in patient care:  Yes (Comment)  Care giving concerns:  Pt admitted from home with pt daughter. PT evaluation completed 11/19 recommending SNF vs. Home 24 hour care and [facility] services. Pt and pt daughter expressed wanting short term rehab prior to pt return home.   Social Worker assessment / plan:  CSW received referral for new SNF.  CSW and RNCM met with pt and pt daughter at bedside. Pt confirmed that she came from home with pt daughter. Pt daughter stated that initial plan was for pt to discharge home with home health services, but pt feels very weak and wanting to seek short term rehab at [facility]. CSW clarified questions and concerns. CSW discussed process of SNF search. Pt and pt daughter agreeable to [facility] and [facility] SNF search. CSW discussed that options may be limited as pt may have upcoming treatments for cancer. Pt and pt daughter expressed understanding.   CSW completed FL2 and initiated SNF search to [facility]. CSW to follow up with pt and pt daughter with SNF bed  offers.  CSW to continue to follow to provide support and assist with pt discharge to SNF when bed available.  Employment status:  Retired Forensic scientist:  Medicare PT Recommendations:  [place] / Referral to community resources:  [place]  Patient/Family's Response to care:  Pt alert and oriented x 4. Pt initially hopeful to d/c home, but feels weak and feels that rehab at SNF will be best option.   Patient/Family's Understanding of and Emotional Response to Diagnosis, Current Treatment, and Prognosis:  Pt and pt daughter display knowledge surrounding pt treatment plan and upcoming plans for cancer treatment.   Emotional Assessment Appearance:  Appears stated age Attitude/Demeanor/Rapport:  Other (appropriate) Affect (typically observed):  Accepting, Appropriate Orientation:  Oriented to Self, Oriented to Place, Oriented to  Time, Oriented to Situation Alcohol / Substance use:  Not Applicable Psych involvement (Current and /or in the community):  No (Comment)  Discharge Needs  Concerns to be addressed:  Discharge Planning Concerns Readmission within the last 30 days:  No Current discharge risk:  None Barriers to Discharge:  No Barriers Identified   [place], [place], LCSW 09/30/2015, 10:25 AM  [phone]

## 2015-09-30 NOTE — Telephone Encounter (Signed)
Called and left a message with follow up  °

## 2015-09-30 NOTE — Telephone Encounter (Signed)
error 

## 2015-09-30 NOTE — Progress Notes (Signed)
PT Cancellation Note  Patient Details Name: Robbi Rainey MRN: [id number] DOB: Mar 02, 1933   Cancelled Treatment:    Reason Eval/Treat Not Completed: Medical issues which prohibited therapy (critical value for WBC.will check back as schedule allows.)   Claretha Cooper 09/30/2015, 7:27 AM Tresa Endo PT [phone]

## 2015-09-30 NOTE — Progress Notes (Signed)
CRITICAL VALUE ALERT  Critical value received:  WBC 67.2  Date of notification:  09/30/15  Time of notification:  0630  Critical value read back:Yes.    Nurse who received alert:  Hortencia Conradi RN   MD notified (1st page):  Fredirick Maudlin NP  Time of first page:  [phone]  MD notified (2nd page):   Time of second page:  Responding MD: Fredirick Maudlin NP   Time MD responded:

## 2015-09-30 NOTE — Consult Note (Signed)
Radiation Oncology         [phone] ________________________________  Name: Kristen Baker MRN: [id number]  Date: 09/25/2015  DOB: Nov 20, 1932  [id number] Irene Limbo, MD   REFERRING PHYSICIAN:  Sullivan Lone, MD   DIAGNOSIS: The primary encounter diagnosis was Neutropenia, drug-induced ([place]). 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   HISTORY OF PRESENT ILLNESS::Kristen Baker is a 81 y.o. female who is seen for an initial consultation visit regarding the patient's diagnosis of Burkitt's lymphoma.  The patient presented with significant fatigue, night sweats, anorexia. The patient ultimately came to the emergency room and was found to have lymphedema as well as a new thrombocytopenia. Workup has ultimately led to a diagnosis of Burkitt's lymphoma. Notably, the patient noted numbness in the shin region she states since the spring. The patient's initial MRI scan was consistent with leptomeningeal disease. The cytology reports which have seen from lumbar puncture were negative but the patient's symptoms and imaging were consistent with cranial involvement. The patient has proceeded with chemotherapy including intrathecal chemotherapy. She states that the numbness is now improved. The patient's recent MRI scan of the brain however did show one area of concern which demonstrated some nodularity in the right there are bowel lung. There was a difference in technique in terms of the MRI scan but it appeared that the patient's leptomeningeal disease was improved. This one area however was of concern and I have been asked to see the patient. She relates no new cranial symptoms. Her dominant complaint today is that of ongoing fatigue.    PREVIOUS RADIATION THERAPY: No   PAST MEDICAL HISTORY:  has a past medical history of Cancer ([place]) and Burkitt's lymphoma ([place]) (08/29/2015).     PAST SURGICAL  HISTORY: Past Surgical History  Procedure Laterality Date  . Cholecystectomy    . Tonsillectomy    . Abdominal hysterectomy    . Abdominal surgery      2-3 rd of colon removed     FAMILY HISTORY: family history includes Gallbladder disease in her mother.   SOCIAL HISTORY:  reports that she has never smoked. She has never used smokeless tobacco. She reports that she does not drink alcohol or use illicit drugs.   ALLERGIES: Cabbage; Codeine; Onion; Shellfish allergy; Sulfa antibiotics; and Zofran   MEDICATIONS:  Current Facility-Administered Medications  Medication Dose Route Frequency Provider Last Rate Last Dose  . 0.9 %  sodium chloride infusion   Intravenous Continuous Belkys A Regalado, MD 45 mL/hr at 09/29/15 1741    . acetaminophen (TYLENOL) tablet 650 mg  650 mg Oral Q6H PRN Charlynne Cousins, MD   650 mg at 09/27/15 0815  . dexamethasone (DECADRON) tablet 4 mg  4 mg Oral BID WC Brunetta Genera, MD   4 mg at 09/29/15 1300  . feeding supplement (ENSURE ENLIVE) (ENSURE ENLIVE) liquid 237 mL  237 mL Oral BID BM Clayton Bibles, RD   237 mL at 09/29/15 1259  . folic acid (FOLVITE) tablet 1 mg  1 mg Oral Daily Brunetta Genera, MD   1 mg at 09/29/15 0819  . heparin lock flush 100 unit/mL  500 Units Intracatheter Daily PRN Brunetta Genera, MD      . heparin lock flush 100 unit/mL  250 Units Intracatheter PRN Brunetta Genera, MD      . insulin aspart (novoLOG) injection 0-9 Units  0-9 Units Subcutaneous  TID WC Charlynne Cousins, MD   1 Units at 09/28/15 1326  . pantoprazole (PROTONIX) EC tablet 40 mg  40 mg Oral Daily Charlynne Cousins, MD   40 mg at 09/29/15 0819  . prochlorperazine (COMPAZINE) tablet 10 mg  10 mg Oral Q6H PRN Charlynne Cousins, MD      . promethazine [facility]) tablet 25 mg  25 mg Oral Q6H PRN Charlynne Cousins, MD       Or  . promethazine (PHENERGAN) injection 12.5 mg  12.5 mg Intravenous Q6H PRN Charlynne Cousins, MD       Or  .  promethazine (PHENERGAN) suppository 12.5 mg  12.5 mg Rectal Q6H PRN Charlynne Cousins, MD      . sodium chloride 0.9 % injection 10 mL  10 mL Intracatheter PRN Brunetta Genera, MD      . sodium chloride 0.9 % injection 3 mL  3 mL Intracatheter PRN Brunetta Genera, MD      . traMADol Veatrice Bourbon) tablet 50 mg  50 mg Oral Q6H PRN Charlynne Cousins, MD   50 mg at 09/26/15 1738     REVIEW OF SYSTEMS:  A 15 point review of systems is documented in the electronic medical record. This was obtained by the nursing staff. However, I reviewed this with the patient to discuss relevant findings and make appropriate changes.  Pertinent items are noted in HPI.    PHYSICAL EXAM:  height is 5\' 5"  (1.651 m) and weight is 163 lb 12.8 oz (74.3 kg). Her oral temperature is 97.5 F (36.4 C). Her blood pressure is 130/56 and her pulse is 62. Her respiration is 16 and oxygen saturation is 94%.   ECOG = 1  0 - Asymptomatic (Fully active, able to carry on all predisease activities without restriction)  1 - Symptomatic but completely ambulatory (Restricted in physically strenuous activity but ambulatory and able to carry out work of a light or sedentary nature. For example, light housework, office work)  2 - Symptomatic, <50% in bed during the day (Ambulatory and capable of all self care but unable to carry out any work activities. Up and about more than 50% of waking hours)  3 - Symptomatic, >50% in bed, but not bedbound (Capable of only limited self-care, confined to bed or chair 50% or more of waking hours)  4 - Bedbound (Completely disabled. Cannot carry on any self-care. Totally confined to bed or chair)  5 - Death   Eustace Pen MM, Creech RH, Tormey DC, et al. [phone]). "Toxicity and response criteria of the [facility]". [place] Oncol. 5 (6): 649-55     LABORATORY DATA:  Lab Results  Component Value Date   WBC 67.2* 09/30/2015   HGB 11.2* 09/30/2015   HCT 33.3* 09/30/2015     MCV 90.2 09/30/2015   PLT 81* 09/30/2015   Lab Results  Component Value Date   NA 135 09/30/2015   K 3.6 09/30/2015   CL 102 09/30/2015   CO2 28 09/30/2015   Lab Results  Component Value Date   ALT 14 09/28/2015   AST 11* 09/28/2015   ALKPHOS 65 09/28/2015   BILITOT 1.1 09/28/2015      RADIOGRAPHY: Dg Chest 2 View  09/06/2015  CLINICAL DATA:  Onset of weakness last night, generalized weakness throughout body, recently diagnosed with cervical cancer with first chemotherapy treatment last week, neutropenia, a intermittent midline lower abdominal pain EXAM: CHEST  2 VIEW COMPARISON:  08/18/2015 FINDINGS: RIGHT jugular Port-A-Cath with tip projecting over SVC. Normal heart size, mediastinal contours and pulmonary vascularity. Atherosclerotic calcification aorta. Lungs clear. No pleural effusion or pneumothorax. Bones demineralized. IMPRESSION: No acute abnormalities. Electronically Signed   By: Lavonia Dana M.D.   On: 09/06/2015 14:17   [name] [id number] Contrast  09/26/2015  CLINICAL DATA:  Headaches. Burkitt's lymphoma on chemotherapy. Low back pain. EXAM: MRI HEAD WITHOUT AND WITH CONTRAST TECHNIQUE: Multiplanar, multiecho pulse sequences of the brain and surrounding structures were obtained without and with intravenous contrast. CONTRAST:  13mL MULTIHANCE GADOBENATE DIMEGLUMINE 529 MG/ML IV SOLN COMPARISON:  08/19/2015 FINDINGS: There are 2 small foci of restricted diffusion which measure 5 mm in the right centrum semiovale and 3 mm in the left frontal white matter anterior to the frontal horn, neither of which have associated enhancement. There is an 8 mm focus of hyperintense diffusion weighted signal in the subcortical white matter of the posterior left frontal lobe with normal ADC and no enhancement. There is no evidence of intracranial hemorrhage, mass, midline shift, or extra-axial fluid collection. There is mild generalized cerebral atrophy. Minimal T2 hyperintensities are present in  the deep cerebral white matter bilaterally, slightly progressed from the prior study and nonspecific but may be post treatment in nature. There is a new 10 x 4 mm nodular focus of enhancement in the right cerebellum (series 11, image 10 and series 13, image 19) without associated parenchymal edema. No abnormal parenchymal brain enhancement or definite abnormal leptomeningeal enhancement is identified elsewhere. Diffuse smooth pachymeningeal enhancement on the prior study has decreased/largely resolved, although some of the apparent difference may be accounted for by differences in magnetic field strength between the 2 examinations (3T on the prior study). Prior bilateral cataract extraction is noted. No significant inflammatory changes are identified in the paranasal sinuses or mastoid air cells. Major intracranial vascular flow voids are preserved. IMPRESSION: 1. Three subcentimeter foci of abnormal diffusion-weighted signal in the white matter of both frontal lobes, consistent with acute to subacute small vessel infarcts. 2. New 10 x 4 mm focus of enhancement in the right cerebellum concerning for a nodular leptomeningeal or possibly parenchymal metastasis. 3. Decreased diffuse pachymeningeal enhancement. Electronically Signed   By: Logan Bores M.D.   On: 09/26/2015 15:45   Mr Lumbar Spine Wo Contrast  09/26/2015  CLINICAL DATA:  Low back pain. Headache. History of Burkitt's lymphoma. EXAM: MRI LUMBAR SPINE WITHOUT CONTRAST TECHNIQUE: Multiplanar, multisequence MR imaging of the lumbar spine was performed. No intravenous contrast was administered. COMPARISON:  None. FINDINGS: The vertebral bodies of the lumbar spine are normal in size. The vertebral bodies of the lumbar spine are normal in alignment. There is degenerative disc disease with disc height loss at L4-5 and L5-S1. There is abnormal marrow lesion in the left side of the sacrum measuring 2.4 x 2.6 cm most consistent lipomatous involvement. There are  peripherally T2 hyperintense marrow lesions in the S1, S3 vertebral bodies as well as the posterior elements consistent with areas of lipomatous involvement. Stippled low signal foci throughout the lumbar spine concerning for lymphomatous involvement. The spinal cord is normal in signal and contour. The cord terminates normally at L1 . The nerve roots of the cauda equina and the filum terminale are normal. The visualized portions of the SI joints are unremarkable. Partially visualized is a retroperitoneal soft tissue mass adjacent to the right psoas muscle measuring at least 5.2 by 3.9 cm. There is an enlarged left para-aortic lymph node  measuring 11 mm in short axis. T12-L1: No significant disc bulge. No evidence of neural foraminal stenosis. No central canal stenosis. L1-L2: No significant disc bulge. No evidence of neural foraminal stenosis. No central canal stenosis. Bilateral facet arthropathy. L2-L3: Mild broad-based disc bulge. Mild bilateral facet arthropathy. No evidence of neural foraminal stenosis. No central canal stenosis. L3-L4: Mild broad-based disc bulge. Moderate bilateral facet arthropathy. No evidence of neural foraminal stenosis. No central canal stenosis. L4-L5: Moderate broad-based disc bulge with a more focal right lateral component abutting the right extra foraminal L4 nerve root. No evidence of neural foraminal stenosis. Mild spinal stenosis. L5-S1: Mild broad-based disc bulge with a right lateral disc osteophyte complex. Mild bilateral facet arthropathy. No evidence of neural foraminal stenosis. No central canal stenosis. IMPRESSION: 1. No acute injury of the lumbar spine. 2. Mild lumbar spine spondylosis as described above. 3. Multiple osseous lesions of the sacrum and ilium most concerning for lymphomatous involvement. Stippled low signal foci throughout the lumbar spine concerning for lymphomatous involvement. Electronically Signed   By: Kathreen Devoid   On: 09/26/2015 14:48   Dg Fluoro  Guide Lumbar Puncture  09/20/2015  CLINICAL DATA:  81 year old female with lymphoma. Repeat intrathecal chemotherapy injection. Subsequent encounter. EXAM: DIAGNOSTIC LUMBAR PUNCTURE UNDER FLUOROSCOPIC GUIDANCE FLUOROSCOPY TIME:  Radiation Exposure Index (as provided by the fluoroscopic device): If the device does not provide the exposure index: Fluoroscopy Time (in minutes and seconds):  0 minutes 30 seconds Number of Acquired Images:  4 PROCEDURE: Informed consent was obtained from the patient prior to the procedure, including potential complications of headache, allergy, and pain. A "time-out" was performed. The chemotherapy pack age from the pharmacy was confirmed with the patient name and date of birth. With the patient prone, the lower back was prepped with Betadine. 1% Lidocaine was used for local anesthesia. Lumbar puncture was performed at the L2-L3 level using right sub laminar approach, a 3.5 inch x 20 gauge needle. Initially when the stylet was removed blood was returned. The needle was repositioned, but blood again was returned. At this point a cross-table lateral view was obtained and demonstrated the tip of the needle within the posterior L3 vertebral body. The needle was then withdrawn 1 cm and slightly blood-tinged and cloudy CSF was returned. 4.5 ml of CSF were obtained for laboratory studies. The patient tolerated the procedure well and there were no apparent complications. Several mL of CSF were then held within the tubing to use as flush. The chemotherapy needle was connected to the unused hub of the 3 way stopcock, and the chemotherapy was slowly injected. This was followed by the CSF flush, and then the stylet was reintroduced to the needle. The needle was withdrawn, direct pressure was held, and hemostasis was noted. Appropriate post procedural orders were placed on the chart. The patient was transported to the ward in stable condition for continued treatment. IMPRESSION: Fluoroscopic  guided lumbar puncture with intrathecal injection of chemotherapy at L2-L3. 4.5 mL of slightly blood-tinged, cloudy CSF obtained for laboratory studies prior to injection. Electronically Signed   By: Genevie Ann M.D.   On: 09/20/2015 13:50   Dg Fluoro Guide Lumbar Puncture  09/16/2015  CLINICAL DATA:  Lymphoma, methotrexate injection. EXAM: FLUOROSCOPICALLY GUIDED LUMBAR PUNCTURE FOR INTRATHECAL CHEMOTHERAPY TECHNIQUE: Informed consent was obtained from the patient prior to the procedure, including potential complications of headache, allergy, and pain. A 'time out' was performed. With the patient prone, the lower back was prepped with Betadine. 1% Lidocaine was  used for local anesthesia. Lumbar puncture was performed at the L5-S1 level using a 20 gauge needle with return of clear CSF. 12 mg Of methotrexate was injected into the subarachnoid space. The patient tolerated the procedure well without apparent complication. FLUOROSCOPY TIME:  Fluoroscopy Time (in minutes and seconds): 0 minutes 40 seconds. Number of Acquired Images:  None. IMPRESSION: 1. Intrathecal injection of chemotherapy without complication. 2. Due to extremely slow flow, less than 1 cc CSF was collected for laboratory evaluation. Electronically Signed   By: Lorin Picket M.D.   On: 09/16/2015 16:18   Dg Fluoro Guide Lumbar Puncture  09/09/2015  CLINICAL DATA:  Burkitt's lymphoma with leptomeningeal involvement. Third dose intrathecal chemotherapy EXAM: FLUOROSCOPICALLY GUIDED LUMBAR PUNCTURE FOR INTRATHECAL CHEMOTHERAPY TECHNIQUE: Informed consent was obtained from the patient prior to the procedure, including potential complications of headache, allergy, and pain. A 'time out' was performed. With the patient prone, the lower back was prepped with Betadine. 1% Lidocaine was used for local anesthesia. Lumbar puncture was performed at the L5-S1 using a 22 gauge needle with return of clear CSF. 12 mg methotrexate and 50 mg hydrocortisone pharmacy  prepared was injected into the subarachnoid space. The patient tolerated the procedure well without apparent complication. FLUOROSCOPY TIME:  1 minutes 11 seconds IMPRESSION: Intrathecal injection of chemotherapy without complication Electronically Signed   By: Suzy Bouchard M.D.   On: 09/09/2015 11:08       IMPRESSION:  The patient is a very pleasant lady with Burkitt's lymphoma, clinically consistent with leptomeningeal involvement on initial workup. This has improved with chemotherapy but she does have a new nodular area of some concern in the right cerebellar region. This measured 1 cm.  The patient's case was discussed in multidisciplinary brain conference this morning. I believe that she certainly could be a good candidate for focal radiation treatment in the setting of a particular area which has not responded to chemotherapy. However, it was felt that it would be reasonable to repeat her imaging given the lack of symptoms and the imaging characteristics and size of this new finding. Certainly her prognosis overall is going to be determined by the response to chemotherapy primarily but focal radiation treatment could be reasonable for salvage treatment with persistent or progressive disease in a particular area. I would expect a very good response locally to radiation treatment.   PLAN: I will coordinate the patient's care with Dr. Irene Limbo. He has mentioned further workup as an outpatient including a PET scan and possible repeat brain MRI scan in 4-6 weeks. I believe that this would be reasonable. In terms of cranial imaging, we can order this and have the patient's upcoming scan reviewed in brain conference. I would recommend a 3T MRI scan that would allow radiation treatment planning if this is felt to be necessary based on the results. Again our office can coordinate this and we will coordinate her care in a general manner with Dr. Irene Limbo.       ________________________________   Jodelle Gross, MD, PhD   **Disclaimer: This note was dictated with voice recognition software. Similar sounding words can inadvertently be transcribed and this note may contain transcription errors which may not have been corrected upon publication of note.**

## 2015-09-30 NOTE — Progress Notes (Signed)
CSW continuing to follow.   CSW followed up with pt and pt daughter at bedside to discuss SNF bed offers. Pt and pt daughter accept bed offer at [facility]. CSW contacted [facility] and confirmed facility can accept pt today.   CSW facilitated pt discharge needs including contacting facility, faxing pt discharge information to [facility], discussing with pt and pt daughter at bedside, providing RN phone number to call report, and providing pt daughter with discharge packet to provide to [facility] upon arrival. Pt daughter plans to transport via private vehicle.  No further social work needs identified at this time.  CSW signing off.   Alison Murray, MSW, [place] Work [phone]

## 2015-09-30 NOTE — Progress Notes (Signed)
Report called to Arbie Cookey at [facility] that will be receiving the patient.

## 2015-09-30 NOTE — Telephone Encounter (Signed)
Patient's appointment has been canceled per Dr. Lisbeth Renshaw. Left message on patient's phone and daughter's phone.

## 2015-09-30 NOTE — Plan of Care (Signed)
Problem: Bowel/Gastric: Goal: Will not experience complications related to bowel motility Outcome: Not Progressing Having loose stools multiple times a day, c. Diff negative; stool softeners discontinued.

## 2015-09-30 NOTE — Progress Notes (Signed)
Nursing Discharge Summary  Patient ID: Shale Mcgann MRN: [id number] DOB/AGE: [birth year] 81 y.o.  Admit date: 09/25/2015 Discharge date: 09/30/2015  Discharged Condition: good  Disposition: 06-Home-Health Care Svc  Follow-up Information    Follow up with Sullivan Lone, MD. Schedule an appointment as soon as possible for a visit in 3 days.   Specialties:  Hematology, Oncology   Contact information:   [place] [address] [phone]       Follow up with [facility].   Specialty:  [place] information:   [place] [phone]      Follow up with Kyung Rudd, MD. Go on 10/07/2015.   Specialty:  Radiation Oncology   Why:  at 0830am   Contact information:   [place] [place] [address] [phone]       Prescriptions Given: Discharge packet given to daughter to be taken to [facility].  Foolow up appointment made with Dr Lisbeth Renshaw and discussed with daughter.  Follow up appointment with Dr Irene Limbo will be scheduled and scheduler at [facility] will contact patient and daughter.   Means of Discharge: Patient to be taken downstairs via wheelchair to be discharged via private vechicle.  Signed: Buel Ream 09/30/2015, 1:57 PM

## 2015-09-30 NOTE — [place] FL2 (Signed)
[place] LEVEL OF CARE SCREENING TOOL     IDENTIFICATION  Patient Name: Kristen Baker Birthdate: [birth year] Sex: female Admission Date (Current Location): 09/25/2015  [facility] and [place] Number: [place] and Address:  [facility],  [place] [address], [place]      Provider Number: [id number]  Attending Physician Name and Address:  Reyne Dumas, MD  Relative Name and Phone Number:       Current Level of Care: Hospital Recommended Level of Care: [place] Prior Approval Number:    Date Approved/Denied:   PASRR Number: [id number]  Discharge Plan: SNF    Current Diagnoses: Patient Active Problem List   Diagnosis Date Noted  . 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 Cerebrovascular accident (CVA) due to bilateral embolism of carotid arteries   . Symptomatic anemia   . Leukopenia 09/26/2015  . Hyponatremia 09/26/2015  . Neutropenia, drug-induced ([place]) 09/26/2015  . Burkitt's lymphoma ([place]) 08/29/2015  . Anemia due to other cause   . Hyperkalemia   . Chemotherapy management, encounter for   . AKI (acute kidney injury) ([place]) 08/27/2015  . Burkitt lymphoma of lymph nodes of multiple regions ([place])   . Leptomeningeal disease   . Encounter for antineoplastic chemotherapy   . Burkitt lymphoma ([place])   . Constipation   . At high risk of tumor lysis syndrome   . Numbness of face   . Headache   . Thrombocytopenia ([place]) 08/18/2015  . Generalized weakness 08/18/2015  . Weight loss 08/18/2015  . Vitamin B12 deficiency 08/18/2015  . GERD (gastroesophageal reflux disease) 08/18/2015  . Hx of gastric ulcer 08/18/2015  . Hx of cervical cancer 08/18/2015  . Hypokalemia 08/18/2015    Orientation ACTIVITIES/SOCIAL BLADDER RESPIRATION    Self, Time, Situation, Place  Active Continent Normal  BEHAVIORAL SYMPTOMS/MOOD NEUROLOGICAL BOWEL NUTRITION STATUS      Continent Diet (Regular Diet)  PHYSICIAN VISITS  COMMUNICATION OF NEEDS Height & Weight Skin    Verbally 5\' 5"  (165.1 cm) 163 lbs. Normal          AMBULATORY STATUS RESPIRATION    Supervision limited (+1 assist for ambulations) Normal      Personal Care Assistance Level of Assistance  Bathing, Dressing, Feeding Bathing Assistance: Limited assistance Feeding assistance: Limited assistance Dressing Assistance: Independent      Functional Limitations Info  Sight, Hearing, Speech Sight Info: Impaired Hearing Info: Adequate Speech Info: Adequate       SPECIAL CARE FACTORS FREQUENCY  PT (By licensed PT), OT (By licensed OT)     PT Frequency: 5x week OT Frequency: 5x week           Additional Factors Info  Code Status, Allergies Code Status Info: FULL code status Allergies Info: Cabbage, Codeine, Onion, Shellfish Allergy, Sulfa Antibiotics, Zofran           Current Medications (09/30/2015): Current Facility-Administered Medications  Medication Dose Route Frequency Provider Last Rate Last Dose  . 0.9 %  sodium chloride infusion   Intravenous Continuous Belkys A Regalado, MD 45 mL/hr at 09/29/15 1741    . acetaminophen (TYLENOL) tablet 650 mg  650 mg Oral Q6H PRN Charlynne Cousins, MD   650 mg at 09/27/15 0815  . dexamethasone (DECADRON) tablet 4 mg  4 mg Oral BID WC Brunetta Genera, MD   4 mg at 09/30/15 0809  . feeding supplement (ENSURE ENLIVE) (ENSURE ENLIVE) liquid 237 mL  237 mL Oral  BID BM Clayton Bibles, RD   237 mL at 09/30/15 0940  . folic acid (FOLVITE) tablet 1 mg  1 mg Oral Daily Brunetta Genera, MD   1 mg at 09/30/15 0939  . heparin lock flush 100 unit/mL  500 Units Intracatheter Daily PRN Brunetta Genera, MD      . heparin lock flush 100 unit/mL  250 Units Intracatheter PRN Brunetta Genera, MD      . insulin aspart (novoLOG) injection 0-9 Units  0-9 Units Subcutaneous TID WC Charlynne Cousins, MD   1 Units at 09/28/15 1326  . pantoprazole (PROTONIX) EC tablet 40 mg  40 mg Oral Daily  Charlynne Cousins, MD   40 mg at 09/30/15 0939  . prochlorperazine (COMPAZINE) tablet 10 mg  10 mg Oral Q6H PRN Charlynne Cousins, MD      . promethazine [facility]) tablet 25 mg  25 mg Oral Q6H PRN Charlynne Cousins, MD       Or  . promethazine (PHENERGAN) injection 12.5 mg  12.5 mg Intravenous Q6H PRN Charlynne Cousins, MD       Or  . promethazine (PHENERGAN) suppository 12.5 mg  12.5 mg Rectal Q6H PRN Charlynne Cousins, MD      . sodium chloride 0.9 % injection 10 mL  10 mL Intracatheter PRN Brunetta Genera, MD      . sodium chloride 0.9 % injection 3 mL  3 mL Intracatheter PRN Brunetta Genera, MD      . traMADol Veatrice Bourbon) tablet 50 mg  50 mg Oral Q6H PRN Charlynne Cousins, MD   50 mg at 09/26/15 1738   Do not use this list as official medication orders. Please verify with discharge summary.  Discharge Medications:   Medication List    STOP taking these medications        PRESCRIPTION MEDICATION      TAKE these medications        dexamethasone 4 MG tablet  Commonly known as:  DECADRON  Take 1 tablet (4 mg total) by mouth daily.     feeding supplement (ENSURE ENLIVE) Liqd  Take 237 mLs by mouth 2 (two) times daily between meals.     folic acid 1 MG tablet  Commonly known as:  FOLVITE  Take 1 tablet (1 mg total) by mouth daily.     omeprazole 40 MG capsule  Commonly known as:  PRILOSEC  Take 40 mg by mouth daily.     oxyCODONE 5 MG immediate release tablet  Commonly known as:  ROXICODONE  Take 1 tablet (5 mg total) by mouth every 4 (four) hours as needed for severe pain.     polyethylene glycol packet  Commonly known as:  MIRALAX / GLYCOLAX  Take 17 g by mouth daily. Hold for diarrhea     prochlorperazine 10 MG tablet  Commonly known as:  COMPAZINE  Take 1 tablet (10 mg total) by mouth every 6 (six) hours as needed for nausea or vomiting.     senna 8.6 MG Tabs tablet  Commonly known as:  SENOKOT  Take 2 tablets (17.2 mg total) by mouth at  bedtime.     traMADol 50 MG tablet  Commonly known as:  ULTRAM  Take 1 tablet (50 mg total) by mouth every 6 (six) hours as needed for moderate pain.     Vitamin D (Ergocalciferol) 50000 UNITS Caps capsule  Commonly known as:  DRISDOL  Take 50,000 Units by mouth  every 7 (seven) days. Take on Tuesdays        Relevant Imaging Results:  Relevant Lab Results:  Recent Labs    Additional Information Patient was receiving chemotherapy treatment prior to admission for Burkitt's lymphoma, per oncology note, patient and her family decided that we will see how she feels over the next 2 weeks off treatment to determine next steps regarding treatment. Radiation oncologist is involved and patient will have outpatient work up to determine if radiation appropriate, so pt may be having radiation treatments in the near future.   Jonavan Vanhorn A, LCSW

## 2015-09-30 NOTE — Plan of Care (Signed)
Problem: Activity: Goal: Risk for activity intolerance will decrease Outcome: Progressing Pt getting up to [facility] with 1 assist

## 2015-09-30 NOTE — Clinical Social Work Placement (Signed)
   CLINICAL SOCIAL WORK PLACEMENT  NOTE  Date:  09/30/2015  Patient Details  Name: Kristen Baker MRN: [id number] Date of Birth: 17-Jan-1933  Clinical Social Work is seeking post-discharge placement for this patient at the [place] level of care (*CSW will initial, date and re-position this form in  chart as items are completed):  Yes   Patient/family provided with [place] Work Department's list of facilities offering this level of care within the geographic area requested by the patient (or if unable, by the patient's family).  Yes   Patient/family informed of their freedom to choose among providers that offer the needed level of care, that participate in Medicare, Medicaid or managed care program needed by the patient, have an available bed and are willing to accept the patient.  Yes   Patient/family informed of [place]'s ownership interest in [facility] and [facility], as well as of the fact that they are under no obligation to receive care at these facilities.  PASRR submitted to EDS on 09/30/15     PASRR number received on 09/30/15     Existing PASRR number confirmed on       FL2 transmitted to all facilities in geographic area requested by pt/family on 09/30/15     FL2 transmitted to all facilities within larger geographic area on       Patient informed that his/her managed care company has contracts with or will negotiate with certain facilities, including the following:            Patient/family informed of bed offers received.  Patient chooses bed at       Physician recommends and patient chooses bed at      Patient to be transferred to   on  .  Patient to be transferred to facility by       Patient family notified on   of transfer.  Name of family member notified:        PHYSICIAN Please sign FL2     Additional Comment:    _______________________________________________ Ladell Pier, LCSW 09/30/2015,  10:19 AM  [phone]

## 2015-10-01 ENCOUNTER — Other Ambulatory Visit: Payer: Self-pay | Admitting: Hematology

## 2015-10-01 NOTE — Progress Notes (Signed)
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 Da EPOCH-R s/p 2 cycles. 2. IT methotrexate + Hydrocortisone qweekly s/p 5 doses   Subjective:  Mrs.Vessels was seen this afternoon with her daughter at bedside. She notes having some loose stools yesterday, C. difficile negative, diarrhea resolved. Overall felt much better after the blood transfusions. Neutropenia has completely resolved. Was seen by radiation oncology (Dr Lisbeth Renshaw) this morning and there is consideration of possible focal radiation therapy to the area of focal brain recurrence if there is progression on interval follow-up.  REVIEW OF SYSTEMS:    10 Point review of systems of done and is negative except as noted above.  . Past Medical History  Diagnosis Date  . Cancer (HCC)     cervical cancer  . Burkitt's lymphoma ([place]) 08/29/2015    . Past Surgical History  Procedure Laterality Date  . Cholecystectomy    . Tonsillectomy    . Abdominal hysterectomy    . Abdominal surgery      2-3 rd of colon removed    . Social History  Substance Use Topics  . Smoking status: Never Smoker   . Smokeless tobacco: Never Used  . Alcohol Use: No    ALLERGIES:  is allergic to cabbage; codeine; onion; shellfish allergy; sulfa antibiotics; and zofran.  MEDICATIONS:  Reviewed in EPIC.  PHYSICAL EXAMINATION: ECOG PERFORMANCE STATUS: 2 - Symptomatic, <50% confined to bed  . Filed Vitals:   09/30/15 0528 09/30/15 1347  BP: 130/56 133/51  Pulse: 62 79  Temp: 97.5 F (36.4 C) 97.3 F (36.3 C)  Resp: 16 18    Filed Weights   09/26/15 0520  Weight: 163 lb 12.8 oz (74.3 kg)  visit  Part  .Body mass index is 27.26 kg/(m^2).  GENERAL:alert,elderly lady, SKIN: no acute skin rashes EYES: normal, conjunctiva are pink and  non-injected, sclera clear OROPHARYNX:no exudate, no erythema and lips,oral mucosa is somewhat dry. NECK: supple, no JVD, thyroid normal size, non-tender, without nodularity LYMPH:  no palpable lymphadenopathy in the cervical, axillary or inguinal LUNGS: clear to auscultation with normal respiratory effort HEART: regular rate & rhythm,  no murmurs and no lower extremity edema ABDOMEN: abdomen soft, non-tender, normoactive bowel sounds  Musculoskeletal: no cyanosis of digits and no clubbing  PSYCH: alert & oriented x 3 with fluent speech NEURO: no focal motor/sensory deficits  LABORATORY DATA:   . CBC Latest Ref Rng 09/30/2015 09/29/2015 09/28/2015  WBC 4.0 - 10.5 K/uL 67.2(HH) 37.4(H) 9.1  Hemoglobin 12.0 - 15.0 g/dL 11.2(L) 11.1(L) 10.8(L)  Hematocrit 36.0 - 46.0 % 33.3(L) 32.8(L) 31.7(L)  Platelets 150 - 400 K/uL 81(L) 79(L) 61(L)    . CMP Latest Ref Rng 09/30/2015 09/29/2015 09/28/2015  Glucose 65 - 99 mg/dL 96 119(H) 124(H)  BUN 6 - 20 mg/dL 16 18 16   Creatinine 0.44 - 1.00 mg/dL 0.63 0.56 0.58  Sodium 135 - 145 mmol/L 135 136 134(L)  Potassium 3.5 - 5.1 mmol/L 3.6 3.7 3.6  Chloride 101 - 111 mmol/L 102 103 102  CO2 22 - 32 mmol/L 28 26 23   Calcium 8.9 - 10.3 mg/dL 7.9(L) 8.3(L) 8.1(L)  Total Protein 6.5 - 8.1 g/dL - - 4.7(L)  Total Bilirubin 0.3 - 1.2 mg/dL - - 1.1  Alkaline Phos 38 - 126 U/L - - 65  AST 15 -  41 U/L - - 11(L)  ALT 14 - 54 U/L - - 14   . Lab Results  Component Value Date   LDH 181 09/25/2015   RADIOGRAPHIC STUDIES: I have personally reviewed the radiological images as listed and agreed with the findings in the report. Dg Chest 2 View  09/06/2015  CLINICAL DATA:  Onset of weakness last night, generalized weakness throughout body, recently diagnosed with cervical cancer with first chemotherapy treatment last week, neutropenia, a intermittent midline lower abdominal pain EXAM: CHEST  2 VIEW COMPARISON:  08/18/2015 FINDINGS: RIGHT jugular Port-A-Cath  with tip projecting over SVC. Normal heart size, mediastinal contours and pulmonary vascularity. Atherosclerotic calcification aorta. Lungs clear. No pleural effusion or pneumothorax. Bones demineralized. IMPRESSION: No acute abnormalities. Electronically Signed   By: Lavonia Dana M.D.   On: 09/06/2015 14:17   [name] [id number] Contrast  09/26/2015  CLINICAL DATA:  Headaches. Burkitt's lymphoma on chemotherapy. Low back pain. EXAM: MRI HEAD WITHOUT AND WITH CONTRAST TECHNIQUE: Multiplanar, multiecho pulse sequences of the brain and surrounding structures were obtained without and with intravenous contrast. CONTRAST:  22mL MULTIHANCE GADOBENATE DIMEGLUMINE 529 MG/ML IV SOLN COMPARISON:  08/19/2015 FINDINGS: There are 2 small foci of restricted diffusion which measure 5 mm in the right centrum semiovale and 3 mm in the left frontal white matter anterior to the frontal horn, neither of which have associated enhancement. There is an 8 mm focus of hyperintense diffusion weighted signal in the subcortical white matter of the posterior left frontal lobe with normal ADC and no enhancement. There is no evidence of intracranial hemorrhage, mass, midline shift, or extra-axial fluid collection. There is mild generalized cerebral atrophy. Minimal T2 hyperintensities are present in the deep cerebral white matter bilaterally, slightly progressed from the prior study and nonspecific but may be post treatment in nature. There is a new 10 x 4 mm nodular focus of enhancement in the right cerebellum (series 11, image 10 and series 13, image 19) without associated parenchymal edema. No abnormal parenchymal brain enhancement or definite abnormal leptomeningeal enhancement is identified elsewhere. Diffuse smooth pachymeningeal enhancement on the prior study has decreased/largely resolved, although some of the apparent difference may be accounted for by differences in magnetic field strength between the 2 examinations (3T on the prior  study). Prior bilateral cataract extraction is noted. No significant inflammatory changes are identified in the paranasal sinuses or mastoid air cells. Major intracranial vascular flow voids are preserved. IMPRESSION: 1. Three subcentimeter foci of abnormal diffusion-weighted signal in the white matter of both frontal lobes, consistent with acute to subacute small vessel infarcts. 2. New 10 x 4 mm focus of enhancement in the right cerebellum concerning for a nodular leptomeningeal or possibly parenchymal metastasis. 3. Decreased diffuse pachymeningeal enhancement. Electronically Signed   By: Logan Bores M.D.   On: 09/26/2015 15:45   Mr Lumbar Spine Wo Contrast  09/26/2015  CLINICAL DATA:  Low back pain. Headache. History of Burkitt's lymphoma. EXAM: MRI LUMBAR SPINE WITHOUT CONTRAST TECHNIQUE: Multiplanar, multisequence MR imaging of the lumbar spine was performed. No intravenous contrast was administered. COMPARISON:  None. FINDINGS: The vertebral bodies of the lumbar spine are normal in size. The vertebral bodies of the lumbar spine are normal in alignment. There is degenerative disc disease with disc height loss at L4-5 and L5-S1. There is abnormal marrow lesion in the left side of the sacrum measuring 2.4 x 2.6 cm most consistent lipomatous involvement. There are peripherally T2 hyperintense marrow lesions in the S1, S3  vertebral bodies as well as the posterior elements consistent with areas of lipomatous involvement. Stippled low signal foci throughout the lumbar spine concerning for lymphomatous involvement. The spinal cord is normal in signal and contour. The cord terminates normally at L1 . The nerve roots of the cauda equina and the filum terminale are normal. The visualized portions of the SI joints are unremarkable. Partially visualized is a retroperitoneal soft tissue mass adjacent to the right psoas muscle measuring at least 5.2 by 3.9 cm. There is an enlarged left para-aortic lymph node measuring  11 mm in short axis. T12-L1: No significant disc bulge. No evidence of neural foraminal stenosis. No central canal stenosis. L1-L2: No significant disc bulge. No evidence of neural foraminal stenosis. No central canal stenosis. Bilateral facet arthropathy. L2-L3: Mild broad-based disc bulge. Mild bilateral facet arthropathy. No evidence of neural foraminal stenosis. No central canal stenosis. L3-L4: Mild broad-based disc bulge. Moderate bilateral facet arthropathy. No evidence of neural foraminal stenosis. No central canal stenosis. L4-L5: Moderate broad-based disc bulge with a more focal right lateral component abutting the right extra foraminal L4 nerve root. No evidence of neural foraminal stenosis. Mild spinal stenosis. L5-S1: Mild broad-based disc bulge with a right lateral disc osteophyte complex. Mild bilateral facet arthropathy. No evidence of neural foraminal stenosis. No central canal stenosis. IMPRESSION: 1. No acute injury of the lumbar spine. 2. Mild lumbar spine spondylosis as described above. 3. Multiple osseous lesions of the sacrum and ilium most concerning for lymphomatous involvement. Stippled low signal foci throughout the lumbar spine concerning for lymphomatous involvement. Electronically Signed   By: Kathreen Devoid   On: 09/26/2015 14:48   Dg Fluoro Guide Lumbar Puncture  09/20/2015  CLINICAL DATA:  81 year old female with lymphoma. Repeat intrathecal chemotherapy injection. Subsequent encounter. EXAM: DIAGNOSTIC LUMBAR PUNCTURE UNDER FLUOROSCOPIC GUIDANCE FLUOROSCOPY TIME:  Radiation Exposure Index (as provided by the fluoroscopic device): If the device does not provide the exposure index: Fluoroscopy Time (in minutes and seconds):  0 minutes 30 seconds Number of Acquired Images:  4 PROCEDURE: Informed consent was obtained from the patient prior to the procedure, including potential complications of headache, allergy, and pain. A "time-out" was performed. The chemotherapy pack age from the  pharmacy was confirmed with the patient name and date of birth. With the patient prone, the lower back was prepped with Betadine. 1% Lidocaine was used for local anesthesia. Lumbar puncture was performed at the L2-L3 level using right sub laminar approach, a 3.5 inch x 20 gauge needle. Initially when the stylet was removed blood was returned. The needle was repositioned, but blood again was returned. At this point a cross-table lateral view was obtained and demonstrated the tip of the needle within the posterior L3 vertebral body. The needle was then withdrawn 1 cm and slightly blood-tinged and cloudy CSF was returned. 4.5 ml of CSF were obtained for laboratory studies. The patient tolerated the procedure well and there were no apparent complications. Several mL of CSF were then held within the tubing to use as flush. The chemotherapy needle was connected to the unused hub of the 3 way stopcock, and the chemotherapy was slowly injected. This was followed by the CSF flush, and then the stylet was reintroduced to the needle. The needle was withdrawn, direct pressure was held, and hemostasis was noted. Appropriate post procedural orders were placed on the chart. The patient was transported to the ward in stable condition for continued treatment. IMPRESSION: Fluoroscopic guided lumbar puncture with intrathecal injection of chemotherapy  at L2-L3. 4.5 mL of slightly blood-tinged, cloudy CSF obtained for laboratory studies prior to injection. Electronically Signed   By: Genevie Ann M.D.   On: 09/20/2015 13:50   Dg Fluoro Guide Lumbar Puncture  09/16/2015  CLINICAL DATA:  Lymphoma, methotrexate injection. EXAM: FLUOROSCOPICALLY GUIDED LUMBAR PUNCTURE FOR INTRATHECAL CHEMOTHERAPY TECHNIQUE: Informed consent was obtained from the patient prior to the procedure, including potential complications of headache, allergy, and pain. A 'time out' was performed. With the patient prone, the lower back was prepped with Betadine. 1%  Lidocaine was used for local anesthesia. Lumbar puncture was performed at the L5-S1 level using a 20 gauge needle with return of clear CSF. 12 mg Of methotrexate was injected into the subarachnoid space. The patient tolerated the procedure well without apparent complication. FLUOROSCOPY TIME:  Fluoroscopy Time (in minutes and seconds): 0 minutes 40 seconds. Number of Acquired Images:  None. IMPRESSION: 1. Intrathecal injection of chemotherapy without complication. 2. Due to extremely slow flow, less than 1 cc CSF was collected for laboratory evaluation. Electronically Signed   By: Lorin Picket M.D.   On: 09/16/2015 16:18   Dg Fluoro Guide Lumbar Puncture  09/09/2015  CLINICAL DATA:  Burkitt's lymphoma with leptomeningeal involvement. Third dose intrathecal chemotherapy EXAM: FLUOROSCOPICALLY GUIDED LUMBAR PUNCTURE FOR INTRATHECAL CHEMOTHERAPY TECHNIQUE: Informed consent was obtained from the patient prior to the procedure, including potential complications of headache, allergy, and pain. A 'time out' was performed. With the patient prone, the lower back was prepped with Betadine. 1% Lidocaine was used for local anesthesia. Lumbar puncture was performed at the L5-S1 using a 22 gauge needle with return of clear CSF. 12 mg methotrexate and 50 mg hydrocortisone pharmacy prepared was injected into the subarachnoid space. The patient tolerated the procedure well without apparent complication. FLUOROSCOPY TIME:  1 minutes 11 seconds IMPRESSION: Intrathecal injection of chemotherapy without complication Electronically Signed   By: Suzy Bouchard M.D.   On: 09/09/2015 11:08    ASSESSMENT & PLAN:   81 yo caucasian female with   1)Stage IVb Burkitts lymphoma with leptomeningeal involvement. On presentation patient had Retroperitoneal and Posterior Mediastinal Lnadenopathy with significantly elevated LDH and leukoerythroblastic picture on peipheral blood smear with multiple large atypical Lymphocytes. Patient  had significant type B constitutional symptoms which have now resolved . MRI Brain with evidence of leptomeningeal involvement consistent with the patients symptoms of chin numbness though LP was unrevealing. Leptomeningeal enhancement much improved on MRI today but concerning new lesion in the rt cerebellum. He is to see BM packed with lymphoma LDH down from 3000's to 1900 to 1215 to 500's to 368 to 352 to 311 to 243 to 181 (today) S/p 2 cycles of daEPOCH-R + IT Methotrexate (weekly for now - received 5rd dose on 09/20/2015)  2) pancytopenia with significant neutropenia likely related to her chemotherapy.  Patient is likely at the nadir of her counts at this time.  She has received Neulasta on 09/23/2015. WBC count appear to be bouncing back. WBC upto 1.4k today. No fevers or chills at this time.  3)significant fatigue likely from chemotherapy and symptomatic anemia and underlying disease.  4) dehydration -resolved  5) significant nausea and vomiting since yesterday along with orthostatic headaches and neck pain.  Could be from possible CSF leak from her recent intrathecal chemotherapy.  No overt fevers to suggest an infection or meningitis at this time.  Headaches and neck pains are significantly improved with laying flat in bed which is in favor of CSF leak.  MRI of the brain shows largely resolved leptomeningeal enhancement.  However noted to have 1 small 10x23mm nodular enhancing lesion in the right cerebellum concerning for parenchymal recurrence of her Burkitt's versus treatment related inflammation vs less likely an infectious process. 6) Subcentimeter lesions on MRI ?lacunar infarcts vs treatment related white matter changes. 7)hyponatremia likely related to dehydration and poor solute intake. 8) MRI L spine shows known BM involvement from [place] -Patient notes that her energy level is much better, neutropenia has resolved and headaches have resolved. -Improving oral  intake. -discussed further goals of care today. -After discussing the role of radiation therapy with Dr. Lisbeth Renshaw ration appears to be open to the idea of considering this. -She will likely be discharged to a skilled nursing facility today for rehabilitation. -We will see her in clinic in 2 weeks with repeat PET scan and MRI of the brain. -Dr. Lisbeth Renshaw prefers a 3 Tesla MRI which we will defer to him for further evaluation and XRT planning. -continue dexamethasone 4mg  po BID -Patient stable to discharge today from oncology standpoint.  I spent 25 minutes counseling the patient face to face. The total time spent in the appointment was 25 minutes and more than 50% was on counseling and direct patient cares and coordination of care with hospitalist team    Sullivan Lone MD [place] AAHIVMS [facility] Hematology/Oncology Physician [facility]  (Office):       [phone] (Work cell):  [phone] (Fax):           [phone]

## 2015-10-02 ENCOUNTER — Telehealth: Payer: Self-pay | Admitting: Hematology

## 2015-10-02 NOTE — Telephone Encounter (Signed)
per pof to sch pt appt-cld pt and adv of appt time & date of 11/30 appt

## 2015-10-07 ENCOUNTER — Encounter: Payer: Self-pay | Admitting: Hematology

## 2015-10-07 ENCOUNTER — Ambulatory Visit (HOSPITAL_BASED_OUTPATIENT_CLINIC_OR_DEPARTMENT_OTHER): Payer: Medicare Other | Admitting: Hematology

## 2015-10-07 ENCOUNTER — Telehealth: Payer: Self-pay | Admitting: *Deleted

## 2015-10-07 ENCOUNTER — Encounter: Payer: Self-pay | Admitting: Radiation Therapy

## 2015-10-07 ENCOUNTER — Ambulatory Visit: Payer: Medicare Other

## 2015-10-07 ENCOUNTER — Institutional Professional Consult (permissible substitution): Payer: No Typology Code available for payment source | Admitting: Radiation Oncology

## 2015-10-07 ENCOUNTER — Encounter: Payer: Self-pay | Admitting: Radiation Oncology

## 2015-10-07 ENCOUNTER — Inpatient Hospital Stay
Admission: RE | Admit: 2015-10-07 | Payer: No Typology Code available for payment source | Source: Ambulatory Visit | Admitting: Radiation Oncology

## 2015-10-07 ENCOUNTER — Telehealth: Payer: Self-pay | Admitting: Hematology

## 2015-10-07 ENCOUNTER — Ambulatory Visit
Admission: RE | Admit: 2015-10-07 | Discharge: 2015-10-07 | Disposition: A | Discharge status: Home / Self Care | Payer: Medicare Other | Source: Ambulatory Visit | Attending: Radiation Oncology | Admitting: Radiation Oncology

## 2015-10-07 ENCOUNTER — Inpatient Hospital Stay: Admission: RE | Admit: 2015-10-07 | Payer: Medicare Other | Source: Ambulatory Visit

## 2015-10-07 ENCOUNTER — Other Ambulatory Visit: Payer: Self-pay | Admitting: Radiation Therapy

## 2015-10-07 VITALS — BP 119/60 | HR 74 | Temp 97.9°F | Resp 20 | Ht 65.5 in | Wt 157.8 lb

## 2015-10-07 VITALS — BP 119/60 | HR 74 | Temp 97.9°F | Resp 20 | Ht 65.5 in | Wt 157.5 lb

## 2015-10-07 DIAGNOSIS — C8371 Burkitt lymphoma, lymph nodes of head, face, and neck: Secondary | ICD-10-CM

## 2015-10-07 DIAGNOSIS — C7949 Secondary malignant neoplasm of other parts of nervous system: Principal | ICD-10-CM

## 2015-10-07 DIAGNOSIS — D709 Neutropenia, unspecified: Secondary | ICD-10-CM

## 2015-10-07 DIAGNOSIS — C8370 Burkitt lymphoma, unspecified site: Secondary | ICD-10-CM | POA: Diagnosis present

## 2015-10-07 DIAGNOSIS — M7989 Other specified soft tissue disorders: Secondary | ICD-10-CM

## 2015-10-07 DIAGNOSIS — C7931 Secondary malignant neoplasm of brain: Secondary | ICD-10-CM

## 2015-10-07 DIAGNOSIS — C709 Malignant neoplasm of meninges, unspecified: Secondary | ICD-10-CM | POA: Diagnosis present

## 2015-10-07 DIAGNOSIS — M899 Disorder of bone, unspecified: Secondary | ICD-10-CM | POA: Diagnosis not present

## 2015-10-07 HISTORY — DX: Allergy, unspecified, initial encounter: T78.40XA

## 2015-10-07 HISTORY — DX: Cerebral infarction, unspecified: I63.9

## 2015-10-07 HISTORY — DX: Malignant neoplasm of brain, unspecified: C71.9

## 2015-10-07 NOTE — Telephone Encounter (Signed)
Add on patient per MD Irene Limbo.

## 2015-10-07 NOTE — Progress Notes (Addendum)
Location/Histology of Brain Tumor: right Cerebellum 10x4 mm  (Stage IVB  Burketts Lymphoma  With leptomeningeal disease )now with with Brain mets/ Multiple osseous lesions L-spine for lymphmatous involvement  Patient presented with symptoms of:   Head aches ,numbness  Nausea, significant fatigue, night sweats,anorexia,   Past or anticipated interventions, if any, per neurosurgery:   Past or anticipated interventions, if any, per medical oncology:Dr. Irene Limbo, MD  Einar Pheasant 09/30/15  IT Methotrexate +Hydrocortisone q weekly s/p 5 doses  Received 09/20/15,; , also DA EPOCH-R s/p 2 cycles  Dose of Decadron, if applicable:  4,g oral BID Recent neurologic symptoms, if any:   Seizures:   Headaches  Nausea:   Dizziness/ataxia: NO  Difficulty with hand coordination: NO  Focal numbness/weakness: left hand weaker than right  Visual deficits/changes:  No  Confusion/Memory deficits: NO  Painful bone metastases at present, if any: No  SAFETY ISSUES: YES fell in bathroom at [employer] this am bathing, abrasion on left elbow and right calf, dated and covered with bandage  Prior radiation?  NO  Pacemaker/ICD? NO  Possible current pregnancy?  N/A Is the patient on methotrexate?  IT methotrexate  Additional Complaints / other details: Residing at [facility]	[place] facility in [place], [place].  Was seen as inpatient by Dr. Lisbeth Renshaw 09/30/15  BP 119/60 mmHg  Pulse 74  Temp(Src) 97.9 F (36.6 C) (Oral)  Resp 20  Ht 5' 5.5" (1.664 m)  Wt 157 lb 8 oz (71.442 kg)  BMI 25.80 kg/m2  SpO2 97%  Wt Readings from Last 3 Encounters:  10/07/15 157 lb 8 oz (71.442 kg)  09/26/15 163 lb 12.8 oz (74.3 kg)  09/17/15 164 lb 8 oz (74.617 kg)

## 2015-10-07 NOTE — Progress Notes (Signed)
1.  Do you need a wheel chair? No, walks with a walker when weak and tired     2. On oxygen? no  3. Have you ever had any surgery in the body part being scanned? no  4. Have you ever had any surgery on your brain or heart?              no  5. Have you ever had surgery on your eyes or ears?                     no       6. Do you have a pacemaker or defibrillator? no    7. Do you have a Neurostimulator? no  8. Claustrophobic?  no  9. Any risk for metal in eyes?  no  10. Injury by bullet, buckshot, or shrapnel?  no  11. Stent?  no                                                                                                               12. Hx of Cancer?                                                                                                               Type  13. Kidney or Liver disease?  no  14. Hx of Lupus, Rheumatoid Arthritis or Scleroderma? no  15. IV Antibiotics or long term use of NSAIDS? no  16. HX of Hypertension?  no  17. Diabetes?  no  18. Allergy to contrast?  no  19. Recent labs. Yes drawn 11/21 and in EPIC   Questions reviewed with daughter via phone 11/28  Mont Dutton

## 2015-10-07 NOTE — Telephone Encounter (Signed)
per pof to sch pt appt-gave pt copy of avs °

## 2015-10-07 NOTE — Progress Notes (Signed)
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 Overall, the patient feels better after discharge from hospital. She has no complaints of headaches, nausea or focal weakness at this time.                            ALLERGIES:  is allergic to cabbage; codeine; onion; shellfish allergy; sulfa antibiotics; and zofran.  Meds: Current Outpatient Prescriptions  Medication Sig Dispense Refill  . dexamethasone (DECADRON) 4 MG tablet Take 1 tablet (4 mg total) by mouth daily. 60 tablet 0  . folic acid (FOLVITE) 1 MG tablet Take 1 tablet (1 mg total) by mouth daily. 30 tablet 0  . omeprazole (PRILOSEC) 40 MG capsule Take 40 mg by mouth daily.    [name] Kitchen oxyCODONE (ROXICODONE) 5 MG immediate release tablet Take 1 tablet (5 mg total) by mouth every 4 (four) hours as needed for severe pain. 30 tablet 0  . polyethylene glycol (MIRALAX / GLYCOLAX) packet Take 17 g by mouth daily. Hold for diarrhea 30 each 0  . prochlorperazine (COMPAZINE) 10 MG tablet Take 1 tablet (10 mg total) by mouth every 6 (six) hours as needed for nausea or vomiting. 30 tablet 0  . traMADol (ULTRAM) 50 MG tablet Take 1 tablet (50 mg total) by mouth every 6 (six) hours as needed for moderate pain. 30 tablet 1  . Vitamin D, Ergocalciferol, (DRISDOL) 50000 UNITS CAPS capsule Take 50,000 Units by mouth every 7 (seven) days. Take on Tuesdays    . feeding supplement, ENSURE ENLIVE, (ENSURE ENLIVE) LIQD Take 237 mLs by mouth 2 (two) times daily between meals. (Patient not taking: Reported on 10/07/2015) 237 mL 12  .  senna (SENOKOT) 8.6 MG TABS tablet Take 2 tablets (17.2 mg total) by mouth at bedtime. (Patient not taking: Reported on 10/07/2015) 120 each 0   No current facility-administered medications for this encounter.    Physical Findings: The patient is in no acute distress. Patient is alert and oriented.  height is 5' 5.5" (1.664 m) and weight is 157 lb 8 oz (71.442 kg). Her oral temperature is 97.9 F (36.6 C). Her blood pressure is 119/60 and her pulse is 74. Her respiration is 20 and oxygen saturation is 97%. .   -Alert & Oriented -Presents in wheelchair today -2+ Edema in lower extremities bilaterally  Lab Findings: Lab Results  Component Value Date   WBC 67.2* 09/30/2015   HGB 11.2* 09/30/2015   HCT 33.3* 09/30/2015   MCV 90.2 09/30/2015   PLT 81* 09/30/2015     Radiographic Findings: [name] Wo Contrast  09/26/2015  CLINICAL DATA:  Headaches. Burkitt's lymphoma on chemotherapy. Low back pain. EXAM: MRI HEAD WITHOUT AND WITH CONTRAST TECHNIQUE: Multiplanar, multiecho pulse sequences of the brain and surrounding structures were obtained without and with intravenous contrast. CONTRAST:  79mL MULTIHANCE GADOBENATE DIMEGLUMINE 529 MG/ML IV SOLN COMPARISON:  08/19/2015 FINDINGS: There are 2 small foci of restricted diffusion which measure 5 mm in the right centrum semiovale and 3 mm in the left frontal white matter  anterior to the frontal horn, neither of which have associated enhancement. There is an 8 mm focus of hyperintense diffusion weighted signal in the subcortical white matter of the posterior left frontal lobe with normal ADC and no enhancement. There is no evidence of intracranial hemorrhage, mass, midline shift, or extra-axial fluid collection. There is mild generalized cerebral atrophy. Minimal T2 hyperintensities are present in the deep cerebral white matter bilaterally, slightly progressed from the prior study and nonspecific but may be post treatment in nature. There is a new  10 x 4 mm nodular focus of enhancement in the right cerebellum (series 11, image 10 and series 13, image 19) without associated parenchymal edema. No abnormal parenchymal brain enhancement or definite abnormal leptomeningeal enhancement is identified elsewhere. Diffuse smooth pachymeningeal enhancement on the prior study has decreased/largely resolved, although some of the apparent difference may be accounted for by differences in magnetic field strength between the 2 examinations (3T on the prior study). Prior bilateral cataract extraction is noted. No significant inflammatory changes are identified in the paranasal sinuses or mastoid air cells. Major intracranial vascular flow voids are preserved. IMPRESSION: 1. Three subcentimeter foci of abnormal diffusion-weighted signal in the white matter of both frontal lobes, consistent with acute to subacute small vessel infarcts. 2. New 10 x 4 mm focus of enhancement in the right cerebellum concerning for a nodular leptomeningeal or possibly parenchymal metastasis. 3. Decreased diffuse pachymeningeal enhancement. Electronically Signed   By: Logan Bores M.D.   On: 09/26/2015 15:45   Mr Lumbar Spine Wo Contrast  09/26/2015  CLINICAL DATA:  Low back pain. Headache. History of Burkitt's lymphoma. EXAM: MRI LUMBAR SPINE WITHOUT CONTRAST TECHNIQUE: Multiplanar, multisequence MR imaging of the lumbar spine was performed. No intravenous contrast was administered. COMPARISON:  None. FINDINGS: The vertebral bodies of the lumbar spine are normal in size. The vertebral bodies of the lumbar spine are normal in alignment. There is degenerative disc disease with disc height loss at L4-5 and L5-S1. There is abnormal marrow lesion in the left side of the sacrum measuring 2.4 x 2.6 cm most consistent lipomatous involvement. There are peripherally T2 hyperintense marrow lesions in the S1, S3 vertebral bodies as well as the posterior elements consistent with areas of lipomatous  involvement. Stippled low signal foci throughout the lumbar spine concerning for lymphomatous involvement. The spinal cord is normal in signal and contour. The cord terminates normally at L1 . The nerve roots of the cauda equina and the filum terminale are normal. The visualized portions of the SI joints are unremarkable. Partially visualized is a retroperitoneal soft tissue mass adjacent to the right psoas muscle measuring at least 5.2 by 3.9 cm. There is an enlarged left para-aortic lymph node measuring 11 mm in short axis. T12-L1: No significant disc bulge. No evidence of neural foraminal stenosis. No central canal stenosis. L1-L2: No significant disc bulge. No evidence of neural foraminal stenosis. No central canal stenosis. Bilateral facet arthropathy. L2-L3: Mild broad-based disc bulge. Mild bilateral facet arthropathy. No evidence of neural foraminal stenosis. No central canal stenosis. L3-L4: Mild broad-based disc bulge. Moderate bilateral facet arthropathy. No evidence of neural foraminal stenosis. No central canal stenosis. L4-L5: Moderate broad-based disc bulge with a more focal right lateral component abutting the right extra foraminal L4 nerve root. No evidence of neural foraminal stenosis. Mild spinal stenosis. L5-S1: Mild broad-based disc bulge with a right lateral disc osteophyte complex. Mild bilateral facet arthropathy. No evidence of neural foraminal stenosis. No central canal stenosis. IMPRESSION: 1.  No acute injury of the lumbar spine. 2. Mild lumbar spine spondylosis as described above. 3. Multiple osseous lesions of the sacrum and ilium most concerning for lymphomatous involvement. Stippled low signal foci throughout the lumbar spine concerning for lymphomatous involvement. Electronically Signed   By: Kathreen Devoid   On: 09/26/2015 14:48   Dg Fluoro Guide Lumbar Puncture  09/20/2015  CLINICAL DATA:  81 year old female with lymphoma. Repeat intrathecal chemotherapy injection. Subsequent  encounter. EXAM: DIAGNOSTIC LUMBAR PUNCTURE UNDER FLUOROSCOPIC GUIDANCE FLUOROSCOPY TIME:  Radiation Exposure Index (as provided by the fluoroscopic device): If the device does not provide the exposure index: Fluoroscopy Time (in minutes and seconds):  0 minutes 30 seconds Number of Acquired Images:  4 PROCEDURE: Informed consent was obtained from the patient prior to the procedure, including potential complications of headache, allergy, and pain. A "time-out" was performed. The chemotherapy pack age from the pharmacy was confirmed with the patient name and date of birth. With the patient prone, the lower back was prepped with Betadine. 1% Lidocaine was used for local anesthesia. Lumbar puncture was performed at the L2-L3 level using right sub laminar approach, a 3.5 inch x 20 gauge needle. Initially when the stylet was removed blood was returned. The needle was repositioned, but blood again was returned. At this point a cross-table lateral view was obtained and demonstrated the tip of the needle within the posterior L3 vertebral body. The needle was then withdrawn 1 cm and slightly blood-tinged and cloudy CSF was returned. 4.5 ml of CSF were obtained for laboratory studies. The patient tolerated the procedure well and there were no apparent complications. Several mL of CSF were then held within the tubing to use as flush. The chemotherapy needle was connected to the unused hub of the 3 way stopcock, and the chemotherapy was slowly injected. This was followed by the CSF flush, and then the stylet was reintroduced to the needle. The needle was withdrawn, direct pressure was held, and hemostasis was noted. Appropriate post procedural orders were placed on the chart. The patient was transported to the ward in stable condition for continued treatment. IMPRESSION: Fluoroscopic guided lumbar puncture with intrathecal injection of chemotherapy at L2-L3. 4.5 mL of slightly blood-tinged, cloudy CSF obtained for laboratory  studies prior to injection. Electronically Signed   By: Genevie Ann M.D.   On: 09/20/2015 13:50   Dg Fluoro Guide Lumbar Puncture  09/16/2015  CLINICAL DATA:  Lymphoma, methotrexate injection. EXAM: FLUOROSCOPICALLY GUIDED LUMBAR PUNCTURE FOR INTRATHECAL CHEMOTHERAPY TECHNIQUE: Informed consent was obtained from the patient prior to the procedure, including potential complications of headache, allergy, and pain. A 'time out' was performed. With the patient prone, the lower back was prepped with Betadine. 1% Lidocaine was used for local anesthesia. Lumbar puncture was performed at the L5-S1 level using a 20 gauge needle with return of clear CSF. 12 mg Of methotrexate was injected into the subarachnoid space. The patient tolerated the procedure well without apparent complication. FLUOROSCOPY TIME:  Fluoroscopy Time (in minutes and seconds): 0 minutes 40 seconds. Number of Acquired Images:  None. IMPRESSION: 1. Intrathecal injection of chemotherapy without complication. 2. Due to extremely slow flow, less than 1 cc CSF was collected for laboratory evaluation. Electronically Signed   By: Lorin Picket M.D.   On: 09/16/2015 16:18   Dg Fluoro Guide Lumbar Puncture  09/09/2015  CLINICAL DATA:  Burkitt's lymphoma with leptomeningeal involvement. Third dose intrathecal chemotherapy EXAM: FLUOROSCOPICALLY GUIDED LUMBAR PUNCTURE FOR INTRATHECAL CHEMOTHERAPY TECHNIQUE: Informed consent was obtained from  the patient prior to the procedure, including potential complications of headache, allergy, and pain. A 'time out' was performed. With the patient prone, the lower back was prepped with Betadine. 1% Lidocaine was used for local anesthesia. Lumbar puncture was performed at the L5-S1 using a 22 gauge needle with return of clear CSF. 12 mg methotrexate and 50 mg hydrocortisone pharmacy prepared was injected into the subarachnoid space. The patient tolerated the procedure well without apparent complication. FLUOROSCOPY TIME:  1  minutes 11 seconds IMPRESSION: Intrathecal injection of chemotherapy without complication Electronically Signed   By: Suzy Bouchard M.D.   On: 09/09/2015 11:08    Impression:  The patient has been diagnosed with Stage IVB Burketts Lymphomawith leptomeningeal disease. Patient is with no new clinical complaints related to CNS involvement. She has responded well to intrathecal chemotherapy but recent MRI has revealed a new nodular area in the right cerebellum, this is worrisome for focal disease. The patient's case was discussed in brain conference and a short term brain MRI scan is recommended.  Plan:  Repeat brain MRI for 1 month after previous scan, which corresponds approximately to 2 weeks from today. Patient will follow up after scan to review the results.    ------------------------------------------------  Jodelle Gross, MD, PhD  This document serves as a record of services personally performed by Kyung Rudd, MD. It was created on his behalf by Derek Mound, a trained medical scribe. The creation of this record is based on the scribe's personal observations and the provider's statements to them. This document has been checked and approved by the attending provider.

## 2015-10-07 NOTE — Progress Notes (Signed)
Please see the Nurse Progress Note in the MD Initial Consult Encounter for this patient. 

## 2015-10-09 ENCOUNTER — Ambulatory Visit: Payer: No Typology Code available for payment source | Admitting: Hematology

## 2015-10-09 ENCOUNTER — Other Ambulatory Visit: Payer: No Typology Code available for payment source

## 2015-10-10 ENCOUNTER — Encounter: Payer: Self-pay | Admitting: Radiation Therapy

## 2015-10-10 NOTE — Progress Notes (Addendum)
12/1 Spoke with pt's daughter, [name] about her future appointments for planning and radiosurgery treatment.  MRI 12/15 @ 10:30  - [facility] [place] Follow-up with Dr. Lisbeth Renshaw and simulation 12/19 @ 9:15 Consult with Dr. Kathyrn Sheriff 12/21 @ 1:30 [place] 12/23 @ 3:00   [facility] R.T. (R) (T) [facility] [place] [phone] Office [phone] Pager [phone] Fax [email or web address]

## 2015-10-17 NOTE — Progress Notes (Signed)
[name] Kitchen  HEMATOLOGY ONCOLOGY PROGRESS NOTE  Date of service: .10/07/2015   Patient Care Team: Brunetta Genera, MD as PCP - General (Hematology)  Diagnosis: Stage IVB Burkitts Lymphoma with leptomeningeal Involvement  Current Treatment:   1. Da EPOCH-R s/p 2 cycles. 2. IT methotrexate + Hydrocortisone qweekly s/p 5 doses   INTERVAL HISTORY:  Kristen Baker is here for her scheduled followup for her Burkitts lymphoma. She notes that she is feeling much better having had some time of chemotherapy. No headches. No new focal neurological deficits. No fevers/chills/night sweats, new LN. Energy levels have improved. Improved po intake. No other acute new concerns. She sounds more open to considering additional treatment than when she was admitted to the hospital.   REVIEW OF SYSTEMS:    10 Point review of systems of done and is negative except as noted above.  . Past Medical History  Diagnosis Date  . Cancer (HCC)     cervical cancer  . Burkitt's lymphoma ([place]) 08/29/2015  . Brain cancer ([place]) 09/26/15    mRI Brain Rt cerebellum 10x45mm lesion  . Stroke [facility]) 09/26/15 mri    3 subcentimeter foci of both frontal lobes, consistent with acute to subacute samll vessel infarcts  . Allergy     . Past Surgical History  Procedure Laterality Date  . Cholecystectomy    . Tonsillectomy    . Abdominal hysterectomy    . Abdominal surgery      2-3 rd of colon removed    . Social History  Substance Use Topics  . Smoking status: Never Smoker   . Smokeless tobacco: Never Used  . Alcohol Use: No    ALLERGIES:  is allergic to cabbage; codeine; onion; shellfish allergy; sulfa antibiotics; and zofran.  MEDICATIONS:  Reviewed in EPIC.  PHYSICAL EXAMINATION: ECOG PERFORMANCE STATUS: 2 - Symptomatic, <50% confined to bed  . Filed Vitals:   10/07/15 1425  BP: 119/60  Pulse: 74  Temp: 97.9 F (36.6 C)  Resp: 20    Filed Weights   10/07/15 1425  Weight: 157 lb 12.8 oz (71.578  kg)  visit  Part  .Body mass index is 25.85 kg/(m^2).  GENERAL:alert,elderly lady in NAD SKIN: no acute skin rashes EYES: normal, conjunctiva are pink and non-injected, sclera clear OROPHARYNX:no exudate, no erythema and lips,oral mucosa moist NECK: supple, no JVD, thyroid normal size, non-tender, without nodularity LYMPH:  no palpable lymphadenopathy in the cervical, axillary or inguinal LUNGS: clear to auscultation with normal respiratory effort HEART: regular rate & rhythm,  no murmurs and no lower extremity edema ABDOMEN: abdomen soft, non-tender, normoactive bowel sounds  Musculoskeletal: no cyanosis of digits and no clubbing  PSYCH: alert & oriented x 3 with fluent speech NEURO: no focal motor/sensory deficits  LABORATORY DATA:   . CBC Latest Ref Rng 09/30/2015 09/29/2015 09/28/2015  WBC 4.0 - 10.5 K/uL 67.2(HH) 37.4(H) 9.1  Hemoglobin 12.0 - 15.0 g/dL 11.2(L) 11.1(L) 10.8(L)  Hematocrit 36.0 - 46.0 % 33.3(L) 32.8(L) 31.7(L)  Platelets 150 - 400 K/uL 81(L) 79(L) 61(L)    . CMP Latest Ref Rng 09/30/2015 09/29/2015 09/28/2015  Glucose 65 - 99 mg/dL 96 119(H) 124(H)  BUN 6 - 20 mg/dL 16 18 16   Creatinine 0.44 - 1.00 mg/dL 0.63 0.56 0.58  Sodium 135 - 145 mmol/L 135 136 134(L)  Potassium 3.5 - 5.1 mmol/L 3.6 3.7 3.6  Chloride 101 - 111 mmol/L 102 103 102  CO2 22 - 32 mmol/L 28 26 23   Calcium 8.9 - 10.3 mg/dL  7.9(L) 8.3(L) 8.1(L)  Total Protein 6.5 - 8.1 g/dL - - 4.7(L)  Total Bilirubin 0.3 - 1.2 mg/dL - - 1.1  Alkaline Phos 38 - 126 U/L - - 65  AST 15 - 41 U/L - - 11(L)  ALT 14 - 54 U/L - - 14   . Lab Results  Component Value Date   LDH 181 09/25/2015   RADIOGRAPHIC STUDIES: I have personally reviewed the radiological images as listed and agreed with the findings in the report. [name] Wo Contrast  09/26/2015  CLINICAL DATA:  Headaches. Burkitt's lymphoma on chemotherapy. Low back pain. EXAM: MRI HEAD WITHOUT AND WITH CONTRAST TECHNIQUE: Multiplanar,  multiecho pulse sequences of the brain and surrounding structures were obtained without and with intravenous contrast. CONTRAST:  19mL MULTIHANCE GADOBENATE DIMEGLUMINE 529 MG/ML IV SOLN COMPARISON:  08/19/2015 FINDINGS: There are 2 small foci of restricted diffusion which measure 5 mm in the right centrum semiovale and 3 mm in the left frontal white matter anterior to the frontal horn, neither of which have associated enhancement. There is an 8 mm focus of hyperintense diffusion weighted signal in the subcortical white matter of the posterior left frontal lobe with normal ADC and no enhancement. There is no evidence of intracranial hemorrhage, mass, midline shift, or extra-axial fluid collection. There is mild generalized cerebral atrophy. Minimal T2 hyperintensities are present in the deep cerebral white matter bilaterally, slightly progressed from the prior study and nonspecific but may be post treatment in nature. There is a new 10 x 4 mm nodular focus of enhancement in the right cerebellum (series 11, image 10 and series 13, image 19) without associated parenchymal edema. No abnormal parenchymal brain enhancement or definite abnormal leptomeningeal enhancement is identified elsewhere. Diffuse smooth pachymeningeal enhancement on the prior study has decreased/largely resolved, although some of the apparent difference may be accounted for by differences in magnetic field strength between the 2 examinations (3T on the prior study). Prior bilateral cataract extraction is noted. No significant inflammatory changes are identified in the paranasal sinuses or mastoid air cells. Major intracranial vascular flow voids are preserved. IMPRESSION: 1. Three subcentimeter foci of abnormal diffusion-weighted signal in the white matter of both frontal lobes, consistent with acute to subacute small vessel infarcts. 2. New 10 x 4 mm focus of enhancement in the right cerebellum concerning for a nodular leptomeningeal or possibly  parenchymal metastasis. 3. Decreased diffuse pachymeningeal enhancement. Electronically Signed   By: Logan Bores M.D.   On: 09/26/2015 15:45   Mr Lumbar Spine Wo Contrast  09/26/2015  CLINICAL DATA:  Low back pain. Headache. History of Burkitt's lymphoma. EXAM: MRI LUMBAR SPINE WITHOUT CONTRAST TECHNIQUE: Multiplanar, multisequence MR imaging of the lumbar spine was performed. No intravenous contrast was administered. COMPARISON:  None. FINDINGS: The vertebral bodies of the lumbar spine are normal in size. The vertebral bodies of the lumbar spine are normal in alignment. There is degenerative disc disease with disc height loss at L4-5 and L5-S1. There is abnormal marrow lesion in the left side of the sacrum measuring 2.4 x 2.6 cm most consistent lipomatous involvement. There are peripherally T2 hyperintense marrow lesions in the S1, S3 vertebral bodies as well as the posterior elements consistent with areas of lipomatous involvement. Stippled low signal foci throughout the lumbar spine concerning for lymphomatous involvement. The spinal cord is normal in signal and contour. The cord terminates normally at L1 . The nerve roots of the cauda equina and the filum terminale are normal. The visualized  portions of the SI joints are unremarkable. Partially visualized is a retroperitoneal soft tissue mass adjacent to the right psoas muscle measuring at least 5.2 by 3.9 cm. There is an enlarged left para-aortic lymph node measuring 11 mm in short axis. T12-L1: No significant disc bulge. No evidence of neural foraminal stenosis. No central canal stenosis. L1-L2: No significant disc bulge. No evidence of neural foraminal stenosis. No central canal stenosis. Bilateral facet arthropathy. L2-L3: Mild broad-based disc bulge. Mild bilateral facet arthropathy. No evidence of neural foraminal stenosis. No central canal stenosis. L3-L4: Mild broad-based disc bulge. Moderate bilateral facet arthropathy. No evidence of neural  foraminal stenosis. No central canal stenosis. L4-L5: Moderate broad-based disc bulge with a more focal right lateral component abutting the right extra foraminal L4 nerve root. No evidence of neural foraminal stenosis. Mild spinal stenosis. L5-S1: Mild broad-based disc bulge with a right lateral disc osteophyte complex. Mild bilateral facet arthropathy. No evidence of neural foraminal stenosis. No central canal stenosis. IMPRESSION: 1. No acute injury of the lumbar spine. 2. Mild lumbar spine spondylosis as described above. 3. Multiple osseous lesions of the sacrum and ilium most concerning for lymphomatous involvement. Stippled low signal foci throughout the lumbar spine concerning for lymphomatous involvement. Electronically Signed   By: Kathreen Devoid   On: 09/26/2015 14:48   Dg Fluoro Guide Lumbar Puncture  09/20/2015  CLINICAL DATA:  81 year old female with lymphoma. Repeat intrathecal chemotherapy injection. Subsequent encounter. EXAM: DIAGNOSTIC LUMBAR PUNCTURE UNDER FLUOROSCOPIC GUIDANCE FLUOROSCOPY TIME:  Radiation Exposure Index (as provided by the fluoroscopic device): If the device does not provide the exposure index: Fluoroscopy Time (in minutes and seconds):  0 minutes 30 seconds Number of Acquired Images:  4 PROCEDURE: Informed consent was obtained from the patient prior to the procedure, including potential complications of headache, allergy, and pain. A "time-out" was performed. The chemotherapy pack age from the pharmacy was confirmed with the patient name and date of birth. With the patient prone, the lower back was prepped with Betadine. 1% Lidocaine was used for local anesthesia. Lumbar puncture was performed at the L2-L3 level using right sub laminar approach, a 3.5 inch x 20 gauge needle. Initially when the stylet was removed blood was returned. The needle was repositioned, but blood again was returned. At this point a cross-table lateral view was obtained and demonstrated the tip of the  needle within the posterior L3 vertebral body. The needle was then withdrawn 1 cm and slightly blood-tinged and cloudy CSF was returned. 4.5 ml of CSF were obtained for laboratory studies. The patient tolerated the procedure well and there were no apparent complications. Several mL of CSF were then held within the tubing to use as flush. The chemotherapy needle was connected to the unused hub of the 3 way stopcock, and the chemotherapy was slowly injected. This was followed by the CSF flush, and then the stylet was reintroduced to the needle. The needle was withdrawn, direct pressure was held, and hemostasis was noted. Appropriate post procedural orders were placed on the chart. The patient was transported to the ward in stable condition for continued treatment. IMPRESSION: Fluoroscopic guided lumbar puncture with intrathecal injection of chemotherapy at L2-L3. 4.5 mL of slightly blood-tinged, cloudy CSF obtained for laboratory studies prior to injection. Electronically Signed   By: Genevie Ann M.D.   On: 09/20/2015 13:50    ASSESSMENT & PLAN:   81 yo caucasian female with   1)Stage IVb Burkitts lymphoma with leptomeningeal involvement. On presentation patient had Retroperitoneal  and Posterior Mediastinal Lnadenopathy with significantly elevated LDH and leukoerythroblastic picture on peipheral blood smear with multiple large atypical Lymphocytes. Patient had significant type B constitutional symptoms which have now resolved . MRI Brain with evidence of leptomeningeal involvement consistent with the patients symptoms of chin numbness though LP was unrevealing. BM packed with lymphoma LDH down from 3000's to 1900 to 1215 to 500's to 368 to 352 to 311 to 243 to 181 (today) S/p 2 cycles of daEPOCH-R + IT Methotrexate (weekly for now - received 5rd dose on 09/20/2015)  MRI brain with improvement in leptomeningeal enhancement with noted have new rt cerebellum lesion  2) pancytopenia with significant  neutropenia likely related to her chemotherapy.  P She has received Neulasta on 09/23/2015 with improvement in her counts. No fevers or chills at this time.  3)significant fatigue likely from chemotherapy.-improving.  4) dehydration-resolved  5) significant nausea and vomiting since yesterday along with orthostatic headaches and neck pain.  Could be from possible CSF leak from her recent intrathecal chemotherapy.  No overt fevers to suggest an infection or meningitis at this time.  Headaches and neck pains are significantly improved with laying flat in bed which is in favor of CSF leak. Resolved with bed rest. MRI brain with imporvement in overal leptomeningeal involvement with 1 new lesion in rt cerebellum.  6)hhyponatremia likely related to dehydration and poor solute intake.resolved Plan -encourage po fluid intake. -patient want some time to recover from treatment -will rpt MRI brain and get PET/CT in 2 week with f/u clinic visit -further treatment plan of her CNS disease and systemic chemotherapy as per findings -radiation oncology following to determine appropriate CNS directed treatment options as well. -ongoing discussion regarding goals of care.  I spent 20 minutes counseling the patient face to face. The total time spent in the appointment was 326minutes and more than 50% was on counseling and direct patient cares and coordination of care with hospitalist team    Sullivan Lone MD [place] AAHIVMS [facility] Hematology/Oncology Physician [facility]  (Office):       [phone] (Work cell):  [phone] (Fax):           [phone]

## 2015-10-18 ENCOUNTER — Ambulatory Visit (HOSPITAL_COMMUNITY)
Admission: RE | Admit: 2015-10-18 | Discharge: 2015-10-18 | Disposition: A | Discharge status: Home / Self Care | Payer: Medicare Other | Source: Ambulatory Visit | Attending: Hematology | Admitting: Hematology

## 2015-10-18 DIAGNOSIS — I700 Atherosclerosis of aorta: Secondary | ICD-10-CM | POA: Insufficient documentation

## 2015-10-18 DIAGNOSIS — C8378 Burkitt lymphoma, lymph nodes of multiple sites: Secondary | ICD-10-CM | POA: Diagnosis present

## 2015-10-18 DIAGNOSIS — N99840 Postprocedural hematoma of a genitourinary system organ or structure following a genitourinary system procedure: Secondary | ICD-10-CM | POA: Insufficient documentation

## 2015-10-18 DIAGNOSIS — Z79899 Other long term (current) drug therapy: Secondary | ICD-10-CM | POA: Insufficient documentation

## 2015-10-18 DIAGNOSIS — R918 Other nonspecific abnormal finding of lung field: Secondary | ICD-10-CM | POA: Diagnosis not present

## 2015-10-18 DIAGNOSIS — R930 Abnormal findings on diagnostic imaging of skull and head, not elsewhere classified: Secondary | ICD-10-CM | POA: Diagnosis not present

## 2015-10-18 DIAGNOSIS — G039 Meningitis, unspecified: Secondary | ICD-10-CM

## 2015-10-18 DIAGNOSIS — G96198 Other disorders of meninges, not elsewhere classified: Secondary | ICD-10-CM

## 2015-10-18 LAB — GLUCOSE, CAPILLARY: Glucose-Capillary: 103 mg/dL — ABNORMAL HIGH (ref 65–99)

## 2015-10-18 MED ORDER — FLUDEOXYGLUCOSE F - 18 (FDG) INJECTION
12.1000 | Freq: Once | INTRAVENOUS | Status: AC | PRN
  Administered 2015-10-18: 12.1 via INTRAVENOUS

## 2015-10-21 ENCOUNTER — Encounter: Payer: Self-pay | Admitting: Hematology

## 2015-10-21 ENCOUNTER — Other Ambulatory Visit (HOSPITAL_BASED_OUTPATIENT_CLINIC_OR_DEPARTMENT_OTHER): Payer: Medicare Other

## 2015-10-21 ENCOUNTER — Ambulatory Visit (HOSPITAL_BASED_OUTPATIENT_CLINIC_OR_DEPARTMENT_OTHER): Payer: Medicare Other | Admitting: Hematology

## 2015-10-21 ENCOUNTER — Ambulatory Visit (HOSPITAL_BASED_OUTPATIENT_CLINIC_OR_DEPARTMENT_OTHER): Payer: Medicare Other

## 2015-10-21 VITALS — BP 118/49 | HR 69 | Temp 97.9°F | Resp 18 | Ht 65.5 in | Wt 157.4 lb

## 2015-10-21 DIAGNOSIS — C8378 Burkitt lymphoma, lymph nodes of multiple sites: Secondary | ICD-10-CM | POA: Diagnosis present

## 2015-10-21 DIAGNOSIS — C8371 Burkitt lymphoma, lymph nodes of head, face, and neck: Secondary | ICD-10-CM

## 2015-10-21 DIAGNOSIS — G96198 Other disorders of meninges, not elsewhere classified: Secondary | ICD-10-CM

## 2015-10-21 DIAGNOSIS — Z95828 Presence of other vascular implants and grafts: Secondary | ICD-10-CM

## 2015-10-21 DIAGNOSIS — G039 Meningitis, unspecified: Secondary | ICD-10-CM

## 2015-10-21 LAB — CBC & DIFF AND RETIC
BASO%: 0.4 % (ref 0.0–2.0)
Basophils Absolute: 0.1 10*3/uL (ref 0.0–0.1)
EOS%: 0.5 % (ref 0.0–7.0)
Eosinophils Absolute: 0.1 10*3/uL (ref 0.0–0.5)
HCT: 40.1 % (ref 34.8–46.6)
HGB: 13.1 g/dL (ref 11.6–15.9)
IMMATURE RETIC FRACT: 6.5 % (ref 1.60–10.00)
LYMPH#: 1 10*3/uL (ref 0.9–3.3)
LYMPH%: 7.4 % — AB (ref 14.0–49.7)
MCH: 30.5 pg (ref 25.1–34.0)
MCHC: 32.7 g/dL (ref 31.5–36.0)
MCV: 93.5 fL (ref 79.5–101.0)
MONO#: 0.9 10*3/uL (ref 0.1–0.9)
MONO%: 7.1 % (ref 0.0–14.0)
NEUT%: 84.6 % — ABNORMAL HIGH (ref 38.4–76.8)
NEUTROS ABS: 11 10*3/uL — AB (ref 1.5–6.5)
Platelets: 141 10*3/uL — ABNORMAL LOW (ref 145–400)
RBC: 4.29 10*6/uL (ref 3.70–5.45)
RDW: 16.8 % — ABNORMAL HIGH (ref 11.2–14.5)
Retic %: 2.16 % — ABNORMAL HIGH (ref 0.70–2.10)
Retic Ct Abs: 92.66 10*3/uL — ABNORMAL HIGH (ref 33.70–90.70)
WBC: 13 10*3/uL — AB (ref 3.9–10.3)

## 2015-10-21 LAB — LACTATE DEHYDROGENASE: LDH: 265 U/L — ABNORMAL HIGH (ref 125–245)

## 2015-10-21 LAB — COMPREHENSIVE METABOLIC PANEL
ALT: 27 U/L (ref 0–55)
AST: 16 U/L (ref 5–34)
Albumin: 3.3 g/dL — ABNORMAL LOW (ref 3.5–5.0)
Alkaline Phosphatase: 81 U/L (ref 40–150)
Anion Gap: 9 mEq/L (ref 3–11)
BILIRUBIN TOTAL: 0.49 mg/dL (ref 0.20–1.20)
BUN: 16.8 mg/dL (ref 7.0–26.0)
CHLORIDE: 101 meq/L (ref 98–109)
CO2: 28 meq/L (ref 22–29)
Calcium: 8.8 mg/dL (ref 8.4–10.4)
Creatinine: 0.7 mg/dL (ref 0.6–1.1)
EGFR: 76 mL/min/{1.73_m2} — AB (ref >90)
GLUCOSE: 145 mg/dL — AB (ref 70–140)
POTASSIUM: 3.9 meq/L (ref 3.5–5.1)
SODIUM: 138 meq/L (ref 136–145)
TOTAL PROTEIN: 5.6 g/dL — AB (ref 6.4–8.3)

## 2015-10-21 MED ORDER — SODIUM CHLORIDE 0.9 % IJ SOLN
10.0000 mL | INTRAMUSCULAR | Status: DC | PRN
  Administered 2015-10-21: 10 mL via INTRAVENOUS
  Filled 2015-10-21: qty 10

## 2015-10-21 MED ORDER — HEPARIN SOD (PORK) LOCK FLUSH 100 UNIT/ML IV SOLN
500.0000 [IU] | Freq: Once | INTRAVENOUS | Status: AC
  Administered 2015-10-21: 500 [IU] via INTRAVENOUS
  Filled 2015-10-21: qty 5

## 2015-10-24 ENCOUNTER — Ambulatory Visit
Admission: RE | Admit: 2015-10-24 | Discharge: 2015-10-24 | Disposition: A | Discharge status: Home / Self Care | Payer: Medicare Other | Source: Ambulatory Visit | Attending: Radiation Oncology | Admitting: Radiation Oncology

## 2015-10-24 DIAGNOSIS — C8371 Burkitt lymphoma, lymph nodes of head, face, and neck: Secondary | ICD-10-CM

## 2015-10-24 MED ORDER — GADOBENATE DIMEGLUMINE 529 MG/ML IV SOLN
14.0000 mL | Freq: Once | INTRAVENOUS | Status: AC | PRN
  Administered 2015-10-24: 14 mL via INTRAVENOUS

## 2015-10-28 ENCOUNTER — Ambulatory Visit: Admission: RE | Admit: 2015-10-28 | Payer: Medicare Other | Source: Ambulatory Visit | Admitting: Radiation Oncology

## 2015-10-28 ENCOUNTER — Encounter: Payer: Self-pay | Admitting: Radiation Oncology

## 2015-10-28 ENCOUNTER — Ambulatory Visit: Payer: Medicare Other

## 2015-10-28 ENCOUNTER — Ambulatory Visit
Admission: RE | Admit: 2015-10-28 | Discharge: 2015-10-28 | Disposition: A | Discharge status: Home / Self Care | Payer: Medicare Other | Source: Ambulatory Visit | Attending: Radiation Oncology | Admitting: Radiation Oncology

## 2015-10-28 ENCOUNTER — Ambulatory Visit: Admission: RE | Admit: 2015-10-28 | Payer: Medicare Other | Source: Ambulatory Visit

## 2015-10-28 ENCOUNTER — Ambulatory Visit: Payer: Medicare Other | Admitting: Radiation Oncology

## 2015-10-28 VITALS — BP 128/58 | HR 84 | Temp 97.9°F | Resp 16 | Ht 65.5 in | Wt 162.4 lb

## 2015-10-28 DIAGNOSIS — C8370 Burkitt lymphoma, unspecified site: Secondary | ICD-10-CM | POA: Diagnosis not present

## 2015-10-28 DIAGNOSIS — C8371 Burkitt lymphoma, lymph nodes of head, face, and neck: Secondary | ICD-10-CM

## 2015-10-28 NOTE — Progress Notes (Signed)
Location/Histology of Brain Tumor: right Cerebellum 10x4 mm  (Stage IVB  Burketts Lymphoma  With leptomeningeal disease )now with with Brain mets/ Multiple osseous lesions L-spine for lymphmatous involvement  Patient presented with symptoms of:   Head aches ,numbness  Nausea, significant fatigue, night sweats,anorexia,   Past or anticipated interventions, if any, per neurosurgery:   Past or anticipated interventions, if any, per medical oncology:Dr. Irene Limbo, MD  Kristen Baker 09/30/15  IT Methotrexate +Hydrocortisone q weekly s/p 5 doses  Received 09/20/15,; , also DA EPOCH-R s/p 2 cycles  Dose of Decadron, if applicable:  4,g oral daily Recent neurologic symptoms, if any:   Seizures: NO  Headaches NO  Nausea: NO  Dizziness/ataxia: NO  Difficulty with hand coordination: NO  Focal numbness/weakness: left hand weaker than right  Visual deficits/changes:  No  Confusion/Memory deficits: NO  Painful bone metastases at present, if any: No  SAFETY ISSUES: YES fell in bathroom at [employer] this am bathing, abrasion on left elbow and right calf, dated and covered with bandage  Prior radiation?  NO  Pacemaker/ICD? NO  Possible current pregnancy?  N/A Is the patient on methotrexate?  IT methotrexate  + Hydrocortisone every week   Additional Complaints / other details: Residing at [facility]	[place] facility in [place], [place].  Was seen as inpatient by Dr. Lisbeth Renshaw 09/30/15    Here to discuss MRI results on 10/24/15:IMPRESSION: Leptomeningeal enhancement continues to improve and has returned to normal. This is most compatible with lymphoma involvement of the meninges which is been treated. Improvement in diffuse bone marrow signal abnormality in the calvarium and cervical spine compatible with treated lymphoma.  Progression of diffuse white matter changes compatible with chemotherapy.  Right cerebellar enhancing lesion no longer enhances and appears represent an area of chronic  infarction.  Resolving areas of subacute infarct in the right frontal and left frontal and parietal white matter compared with the prior study.  2.5 mm punctate area of enhancement in the midbrain in the floor of the fourth ventricle has shown slight interval growth since the most recent study. This may represent subacute infarct. Lymphoma is a consideration however given the improvement in the other imaging findings lymphoma, this is felt more likely related to ischemia. Follow-up MRI suggested.   No c/o pain or discomfort, no nausea, appetite good stated  BP 128/58 mmHg  Pulse 84  Temp(Src) 97.9 F (36.6 C) (Oral)  Resp 16  Ht 5' 5.5" (1.664 m)  Wt 162 lb 6.4 oz (73.664 kg)  BMI 26.60 kg/m2  SpO2 98%  Wt Readings from Last 3 Encounters:  10/28/15 162 lb 6.4 oz (73.664 kg)  10/21/15 157 lb 6.4 oz (71.396 kg)  10/07/15 157 lb 12.8 oz (71.578 kg)

## 2015-10-28 NOTE — Progress Notes (Signed)
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 Patient states she feels weak. No focal weakness. No c/o pain or discomfort, no nausea, appetite good stated.               ALLERGIES:  is allergic to cabbage; codeine; onion; shellfish allergy; sulfa antibiotics; and zofran.  Meds: Current Outpatient Prescriptions  Medication Sig Dispense Refill  . dexamethasone (DECADRON) 4 MG tablet Take 1 tablet (4 mg total) by mouth daily. 60 tablet 0  . folic acid (FOLVITE) 1 MG tablet Take 1 tablet (1 mg total) by mouth daily. 30 tablet 0  . furosemide (LASIX) 40 MG tablet TK 1 T PO QAM  0  . omeprazole (PRILOSEC) 40 MG capsule Take 40 mg by mouth daily.    . polyethylene glycol (MIRALAX / GLYCOLAX) packet Take 17 g by mouth daily. Hold for diarrhea 30 each 0  . senna (SENOKOT) 8.6 MG TABS tablet Take 2 tablets (17.2 mg total) by mouth at bedtime. 120 each 0  . Vitamin D, Ergocalciferol, (DRISDOL) 50000 UNITS CAPS capsule Take 50,000 Units by mouth every 7 (seven) days. Take on Tuesdays    . feeding supplement, ENSURE ENLIVE, (ENSURE ENLIVE) LIQD Take 237 mLs by mouth 2 (two) times daily between meals. (Patient not taking: Reported on 10/28/2015) 237 mL 12  . oxyCODONE (ROXICODONE) 5 MG immediate release tablet Take 1 tablet (5 mg total) by mouth every 4 (four) hours as needed for severe pain. (Patient not taking: Reported on 10/28/2015) 30 tablet 0  . prochlorperazine (COMPAZINE) 10 MG tablet Take 1 tablet (10 mg total) by mouth every 6 (six) hours as needed for nausea or  vomiting. (Patient not taking: Reported on 10/28/2015) 30 tablet 0  . traMADol (ULTRAM) 50 MG tablet Take 1 tablet (50 mg total) by mouth every 6 (six) hours as needed for moderate pain. (Patient not taking: Reported on 10/28/2015) 30 tablet 1   No current facility-administered medications for this encounter.    Physical Findings: The patient is in no acute distress. Patient is alert and oriented.  height is 5' 5.5" (1.664 m) and weight is 162 lb 6.4 oz (73.664 kg). Her oral temperature is 97.9 F (36.6 C). Her blood pressure is 128/58 and her pulse is 84. Her respiration is 16 and oxygen saturation is 98%. .   Mild bilateral lower extremity edema.   Lab Findings: Lab Results  Component Value Date   WBC 13.0* 10/21/2015   HGB 13.1 10/21/2015   HCT 40.1 10/21/2015   MCV 93.5 10/21/2015   PLT 141* 10/21/2015   Radiographic Findings: [name] [id number] Contrast  10/24/2015  CLINICAL DATA:  Burkitt lymphoma.  Chemotherapy treatment. EXAM: MRI HEAD WITHOUT AND WITH CONTRAST TECHNIQUE: Multiplanar, multiecho pulse sequences of the brain and surrounding structures were obtained without and with intravenous contrast. CONTRAST:  32mL MULTIHANCE GADOBENATE DIMEGLUMINE 529 MG/ML IV SOLN COMPARISON:  MRI head 09/26/2015, 08/19/2015 FINDINGS: Continued improvement in leptomeningeal enhancement. Leptomeningeal enhancement on today's study has returned to normal. This is most compatible with lymphomatous involvement of the meninges  which has been treated. There is improvement in bone marrow signal throughout the calvarium and cervical spine compared with earlier studies which most likely also related to treated lymphoma in the bone marrow. 8 mm enhancing lesion in the left parietal bone is unchanged from prior studies and could represent an incidental benign lesion versus lymphoma. Given stability over time is most likely an unrelated benign skull lesion. Significant progression of diffuse white matter  hyperintensity throughout the cerebral hemispheres bilaterally extending into the internal and external capsule and pons. Hyperintensity in the thalamus bilaterally shows mild progression. These findings are likely related to chemotherapy. To my knowledge, the patient has not had any cranial radiation. Negative for intracranial hemorrhage.  No focal mass lesion. Right cerebellar enhancing lesion seen on the most recent MRI no longer enhances. This is a focal area of encephalomalacia with the appearance of a chronic infarct. Focal areas of hyperintensity on diffusion-weighted imaging again noted in the right frontal white matter, left frontal and left parietal white matter. These do not show restricted diffusion on ADC map most likely are areas of chronic ischemia. Note that these were not present on 08/19/2015. Postcontrast imaging reveals a punctate area of enhancement in the floor of the fourth ventricle measuring 2.5 mm. This is seen in retrospect on the prior study and has enlarged. This does not show restricted diffusion however is a very small lesion and could be missed on diffusion-weighted imaging. No other areas of abnormal enhancement identified in the brain. IMPRESSION: Leptomeningeal enhancement continues to improve and has returned to normal. This is most compatible with lymphoma involvement of the meninges which is been treated. Improvement in diffuse bone marrow signal abnormality in the calvarium and cervical spine compatible with treated lymphoma. Progression of diffuse white matter changes compatible with chemotherapy. Right cerebellar enhancing lesion no longer enhances and appears represent an area of chronic infarction. Resolving areas of subacute infarct in the right frontal and left frontal and parietal white matter compared with the prior study. 2.5 mm punctate area of enhancement in the midbrain in the floor of the fourth ventricle has shown slight interval growth since the most recent  study. This may represent subacute infarct. Lymphoma is a consideration however given the improvement in the other imaging findings lymphoma, this is felt more likely related to ischemia. Follow-up MRI suggested. Electronically Signed   By: Franchot Gallo M.D.   On: 10/24/2015 13:07   Nm Pet Image Restage (ps) Whole Body  10/18/2015  CLINICAL DATA:  Subsequent treatment strategy for Burkitt's lymphoma EXAM: NUCLEAR MEDICINE PET WHOLE BODY TECHNIQUE: 8.5 mCi F-18 FDG was injected intravenously. Full-ring PET imaging was performed from the vertex to the feet after the radiotracer. CT data was obtained and used for attenuation correction and anatomic localization. FASTING BLOOD GLUCOSE:  Value:  103 mg/dl COMPARISON:  08/27/2015 FINDINGS: Head/Neck: The small enhancing focus in the right cerebellum on recent brain MRI does not have a significant CT or PET correlate. There is evidence of chronic microvascular white matter disease on the CT data but no compelling signs of large lesion hypermetabolic or hypometabolic to brain tissue identified. No adenopathy in the neck. Chest: New triangular nodular density anteriorly in the right middle lobe, a 1.0 by 1.0 cm, appears to have some faintly increased metabolic activity with maximum standard uptake value approximately 3.1. Coronary, aortic arch, and branch vessel atherosclerotic vascular disease. There is some stranding in the adipose tissue at the aortic hiatus although the masslike appearance is less  notable, and currently there is no significant hypermetabolic activity in this vicinity. Abdomen/Pelvis: Reduced size in density of hematoma inferiorly along the right perirenal fascia, 4.7 by 2.3 cm, formerly 7.5 by 3.2 cm, maximum standard uptake value 4.2 (formerly 4.5). This is significantly less dense than before, and likely represents improving hematoma related to prior biopsy. Lower precaval lymph node short axis diameter 1.0 cm on image 159 series 4, maximum  standard uptake value 2.7. Prior presacral mildly hypermetabolic confluent stranding has resolved. Aortoiliac atherosclerotic vascular disease. Skeleton: Diffuse patchy hypermetabolic activity in the marrow but improved from prior. For example, L3 vertebral body activity was previously 6.5 by my measurement, and currently 4.3. IMPRESSION: 1. Generally significantly improved appearance, with reduced conspicuity and metabolic activity of the right retrocrural tissue at the aortic hiatus ; continued reduction in size and activity of the inferior precaval lymph node ; resolution of the presacral stranding; and improved but not resolved patchy hypermetabolic activity in the bone marrow. 2. There is a new triangular focus anteriorly in the right middle lobe which is slightly hypermetabolic, probably inflammatory, but merits observation. 3. Reduced size and density of the post biopsy hematoma inferiorly along the right perirenal fascia. 4. The small enhancing focus in the right cerebellum shown on the MRI from 09/26/2015 is not readily apparent on PET-CT. Electronically Signed   By: Van Clines M.D.   On: 10/18/2015 10:23    Impression:  The patient has been diagnosed with Stage IVB Burketts Lymphomawith leptomeningeal disease. Brain MRI on 10/24/15 shows continued improvement. The area of previous concern in the right cerebellar region has significantly resolved in terms of enhancement and does not remain concerning for parenchymal disease. A 2.5 mm punctate area of enhancement in the midbrain in the floor of the fourth ventricle has shown slight interval growth since the most recent study and should be closely monitored. Her case was discussed in brain conference today.   Plan:  Continued follow up with medical oncology. The patient does not have scheduled concern for parenchymal involvement at this time. A very small lesion in the brainstem was noted which can be followed up on future CNS imaging. I  discussed possible follow-up options with the patient and we decided that she will return to our clinic on a when necessary basis. I would be happy to see the patient again or review her case further if future brain imaging reveals any areas of concern for which radiation treatment may be helpful. At this time, it appears that she has experienced a significant response from intrathecal chemotherapy.  ------------------------------------------------  Jodelle Gross, MD, PhD  This document serves as a record of services personally performed by Kyung Rudd, MD. It was created on his behalf by Derek Mound, a trained medical scribe. The creation of this record is based on the scribe's personal observations and the provider's statements to them. This document has been checked and approved by the attending provider.

## 2015-10-31 ENCOUNTER — Encounter (HOSPITAL_COMMUNITY): Payer: Self-pay

## 2015-11-01 ENCOUNTER — Ambulatory Visit: Admission: RE | Admit: 2015-11-01 | Payer: Medicare Other | Source: Ambulatory Visit | Admitting: Radiation Oncology

## 2015-11-01 ENCOUNTER — Encounter: Payer: Self-pay | Admitting: Radiation Oncology

## 2015-11-01 ENCOUNTER — Ambulatory Visit: Payer: Medicare Other | Admitting: Radiation Oncology

## 2015-11-12 ENCOUNTER — Other Ambulatory Visit: Payer: Self-pay | Admitting: Hematology

## 2015-11-13 ENCOUNTER — Telehealth: Payer: Self-pay | Admitting: Hematology

## 2015-11-13 ENCOUNTER — Other Ambulatory Visit: Payer: Self-pay | Admitting: Hematology

## 2015-11-13 DIAGNOSIS — C8378 Burkitt lymphoma, lymph nodes of multiple sites: Secondary | ICD-10-CM

## 2015-11-13 NOTE — Progress Notes (Signed)
[name]  HEMATOLOGY ONCOLOGY PROGRESS NOTE  Date of service: .10/21/2015    Patient Care Team: Brunetta Genera, MD as PCP - General (Hematology)  Diagnosis: Stage IVB Burkitts Lymphoma with leptomeningeal Involvement  Current Treatment:   1. Da EPOCH-R s/p 2 cycles. 2. IT methotrexate + Hydrocortisone qweekly s/p 5 doses   INTERVAL HISTORY:  Kristen Baker is here for her scheduled followup for her Burkitts lymphoma and to discuss the results of her PET/CT after 2 cycles of chemotherapy. She notes that she is overall doing okay. No fevers or chills. Still has about grade 1-2 fatigue. Grade 1-2 alopecia. No palpable new lymph nodes. Awaiting MRI of the brain scheduled for 10/24/2015 and subsequent follow-up with Dr. Lisbeth Renshaw from radiation oncology to determine radiation treatment option for her cerebellar lesion. We discussed the PET scan result that shows treatment response for her systemic disease. No other acute new symptoms. No new focal neurological deficits. Patient accompanied by family for this visit.   REVIEW OF SYSTEMS:    10 Point review of systems of done and is negative except as noted above.  . Past Medical History  Diagnosis Date  . Cancer (HCC)     cervical cancer  . Burkitt's lymphoma ([place]) 08/29/2015  . Brain cancer ([place]) 09/26/15    mRI Brain Rt cerebellum 10x26mm lesion  . Stroke [facility]) 09/26/15 mri    3 subcentimeter foci of both frontal lobes, consistent with acute to subacute samll vessel infarcts  . Allergy     . Past Surgical History  Procedure Laterality Date  . Cholecystectomy    . Tonsillectomy    . Abdominal hysterectomy    . Abdominal surgery      2-3 rd of colon removed    . Social History  Substance Use Topics  . Smoking status: Never Smoker   . Smokeless tobacco: Never Used  . Alcohol Use: No    ALLERGIES:  is allergic to cabbage; codeine; onion; shellfish allergy; sulfa antibiotics; and zofran.  MEDICATIONS:  Reviewed in  EPIC.  PHYSICAL EXAMINATION: ECOG PERFORMANCE STATUS: 2 - Symptomatic, <50% confined to bed  . Filed Vitals:   10/21/15 1005  BP: 118/49  Pulse: 69  Temp: 97.9 F (36.6 C)  Resp: 18    Filed Weights   10/21/15 1005  Weight: 157 lb 6.4 oz (71.396 kg)  visit  Part  .Body mass index is 25.79 kg/(m^2).  GENERAL:alert,elderly lady in NAD, somewhat fatigued appearing SKIN: no acute skin rashes, chemotherapy associated alopecia noted. EYES: normal, conjunctiva are pink and non-injected, sclera clear OROPHARYNX:no exudate, no erythema and lips,oral mucosa moist NECK: supple, no JVD, thyroid normal size, non-tender, without nodularity LYMPH:  no palpable lymphadenopathy in the cervical, axillary or inguinal LUNGS: clear to auscultation with normal respiratory effort HEART: regular rate & rhythm,  no murmurs and no lower extremity edema ABDOMEN: abdomen soft, non-tender, normoactive bowel sounds  Musculoskeletal: no cyanosis of digits and no clubbing  PSYCH: alert & oriented x 3 with fluent speech NEURO: no focal motor/sensory deficits  LABORATORY DATA:   . CBC Latest Ref Rng 10/21/2015 09/30/2015 09/29/2015  WBC 3.9 - 10.3 10e3/uL 13.0(H) 67.2(HH) 37.4(H)  Hemoglobin 11.6 - 15.9 g/dL 13.1 11.2(L) 11.1(L)  Hematocrit 34.8 - 46.6 % 40.1 33.3(L) 32.8(L)  Platelets 145 - 400 10e3/uL 141(L) 81(L) 79(L)    . CMP Latest Ref Rng 10/21/2015 09/30/2015 09/29/2015  Glucose 70 - 140 mg/dl 145(H) 96 119(H)  BUN 7.0 - 26.0 mg/dL 16.8 16 18  Creatinine 0.6 - 1.1 mg/dL 0.7 0.63 0.56  Sodium 136 - 145 mEq/L 138 135 136  Potassium 3.5 - 5.1 mEq/L 3.9 3.6 3.7  Chloride 101 - 111 mmol/L - 102 103  CO2 22 - 29 mEq/L 28 28 26   Calcium 8.4 - 10.4 mg/dL 8.8 7.9(L) 8.3(L)  Total Protein 6.4 - 8.3 g/dL 5.6(L) - -  Total Bilirubin 0.20 - 1.20 mg/dL 0.49 - -  Alkaline Phos 40 - 150 U/L 81 - -  AST 5 - 34 U/L 16 - -  ALT 0 - 55 U/L 27 - -   . Lab Results  Component Value Date   LDH 265*  10/21/2015   RADIOGRAPHIC STUDIES: I have personally reviewed the radiological images as listed and agreed with the findings in the report. Mr Kristen Baker Wo Contrast  Nm Pet Image Restage (ps) Whole Body  10/18/2015  CLINICAL DATA:  Subsequent treatment strategy for Burkitt's lymphoma EXAM: NUCLEAR MEDICINE PET WHOLE BODY TECHNIQUE: 8.5 mCi F-18 FDG was injected intravenously. Full-ring PET imaging was performed from the vertex to the feet after the radiotracer. CT data was obtained and used for attenuation correction and anatomic localization. FASTING BLOOD GLUCOSE:  Value:  103 mg/dl COMPARISON:  08/27/2015 FINDINGS: Head/Neck: The small enhancing focus in the right cerebellum on recent brain MRI does not have a significant CT or PET correlate. There is evidence of chronic microvascular white matter disease on the CT data but no compelling signs of large lesion hypermetabolic or hypometabolic to brain tissue identified. No adenopathy in the neck. Chest: New triangular nodular density anteriorly in the right middle lobe, a 1.0 by 1.0 cm, appears to have some faintly increased metabolic activity with maximum standard uptake value approximately 3.1. Coronary, aortic arch, and branch vessel atherosclerotic vascular disease. There is some stranding in the adipose tissue at the aortic hiatus although the masslike appearance is less notable, and currently there is no significant hypermetabolic activity in this vicinity. Abdomen/Pelvis: Reduced size in density of hematoma inferiorly along the right perirenal fascia, 4.7 by 2.3 cm, formerly 7.5 by 3.2 cm, maximum standard uptake value 4.2 (formerly 4.5). This is significantly less dense than before, and likely represents improving hematoma related to prior biopsy. Lower precaval lymph node short axis diameter 1.0 cm on image 159 series 4, maximum standard uptake value 2.7. Prior presacral mildly hypermetabolic confluent stranding has resolved. Aortoiliac  atherosclerotic vascular disease. Skeleton: Diffuse patchy hypermetabolic activity in the marrow but improved from prior. For example, L3 vertebral body activity was previously 6.5 by my measurement, and currently 4.3. IMPRESSION: 1. Generally significantly improved appearance, with reduced conspicuity and metabolic activity of the right retrocrural tissue at the aortic hiatus ; continued reduction in size and activity of the inferior precaval lymph node ; resolution of the presacral stranding; and improved but not resolved patchy hypermetabolic activity in the bone marrow. 2. There is a new triangular focus anteriorly in the right middle lobe which is slightly hypermetabolic, probably inflammatory, but merits observation. 3. Reduced size and density of the post biopsy hematoma inferiorly along the right perirenal fascia. 4. The small enhancing focus in the right cerebellum shown on the MRI from 09/26/2015 is not readily apparent on PET-CT. Electronically Signed   By: Van Clines M.D.   On: 10/18/2015 10:23    ASSESSMENT & PLAN:   82 yo caucasian female with   1)Stage IVb Burkitts lymphoma with leptomeningeal involvement. On presentation patient had Retroperitoneal and Posterior Mediastinal Lnadenopathy with significantly  elevated LDH and leukoerythroblastic picture on peipheral blood smear with multiple large atypical Lymphocytes. Patient had significant type B constitutional symptoms which have now resolved . MRI Brain with evidence of leptomeningeal involvement consistent with the patients symptoms of chin numbness though LP was unrevealing. BM packed with lymphoma LDH down from 3000's to 1900 to 1215 to 500's to 368 to 352 to 311 to 243 to 181 and now slowly rising to 265 S/p 2 cycles of daEPOCH-R + IT Methotrexate (weekly for now - received 5rd dose on 09/20/2015)  PET/CT scan shows treatment response as expected with no evidence of overt disease progression.  MRI brain with improvement  in leptomeningeal enhancement with noted have new rt cerebellum lesion. Is due for a follow-up MRI of the brain on 10/24/2015 and reevaluation by Dr. Lisbeth Renshaw for consideration of radiation.   2) pancytopenia with significant neutropenia likely related to her chemotherapy.  P She has received Neulasta on 09/23/2015 with improvement in her counts. No fevers or chills at this time.  3)significant fatigue likely from chemotherapy.Improved with time off chemotherapy.   Plan -We discussed in detail the patient's PET/CT scan results as showing treatment response with no overt evidence of disease progression. Her LDH level is starting to slowly creep up again. -Her repeat MRI of the brain has been scheduled for 10/24/2015 and this will be reviewed by Dr. Lisbeth Renshaw to determine radiation treatment options for her cerebellar lesion and possibly the remaining leptomeningeal disease if evidence of progression. -We discussed the patient's goals of care in detail to determine if she would want to consider further chemotherapy and she noted that she might consider this but wanted to see with MRI of the brain showed how she felt after radiation therapy in the early part of next year after Christmas and new year. -We'll follow-up with the patient regarding next steps for treatment after her MRI evaluation and discussion with Dr. Lisbeth Renshaw. -If the patient does choose to get additional chemotherapy we will need to plan further cycles of dose adjusted EPOCH-R in early January as per patient preference.   I spent 20 minutes counseling the patient face to face. The total time spent in the appointment was 67minutes and more than 50% was on counseling and direct patient cares and coordination of care with hospitalist team    Sullivan Lone MD [place] AAHIVMS [facility] Hematology/Oncology Physician [facility]  (Office):       [phone] (Work cell):  [phone] (Fax):           [phone]

## 2015-11-13 NOTE — Telephone Encounter (Signed)
Appointments made and patient called °

## 2015-11-14 ENCOUNTER — Ambulatory Visit (HOSPITAL_BASED_OUTPATIENT_CLINIC_OR_DEPARTMENT_OTHER): Payer: Medicare Other | Admitting: Hematology

## 2015-11-14 ENCOUNTER — Other Ambulatory Visit (HOSPITAL_BASED_OUTPATIENT_CLINIC_OR_DEPARTMENT_OTHER): Payer: Medicare Other

## 2015-11-14 ENCOUNTER — Encounter: Payer: Self-pay | Admitting: Hematology

## 2015-11-14 ENCOUNTER — Ambulatory Visit (HOSPITAL_BASED_OUTPATIENT_CLINIC_OR_DEPARTMENT_OTHER): Payer: Medicare Other

## 2015-11-14 VITALS — BP 127/67 | HR 74 | Temp 97.8°F | Resp 17 | Ht 65.0 in | Wt 163.0 lb

## 2015-11-14 DIAGNOSIS — C8378 Burkitt lymphoma, lymph nodes of multiple sites: Secondary | ICD-10-CM | POA: Diagnosis not present

## 2015-11-14 DIAGNOSIS — Z95828 Presence of other vascular implants and grafts: Secondary | ICD-10-CM

## 2015-11-14 DIAGNOSIS — C8371 Burkitt lymphoma, lymph nodes of head, face, and neck: Secondary | ICD-10-CM | POA: Diagnosis present

## 2015-11-14 DIAGNOSIS — Z7189 Other specified counseling: Secondary | ICD-10-CM

## 2015-11-14 LAB — CBC & DIFF AND RETIC
BASO%: 0.1 % (ref 0.0–2.0)
BASOS ABS: 0 10*3/uL (ref 0.0–0.1)
EOS%: 0.3 % (ref 0.0–7.0)
Eosinophils Absolute: 0 10*3/uL (ref 0.0–0.5)
HEMATOCRIT: 37.3 % (ref 34.8–46.6)
HGB: 12.1 g/dL (ref 11.6–15.9)
IMMATURE RETIC FRACT: 9.9 % (ref 1.60–10.00)
LYMPH#: 2.8 10*3/uL (ref 0.9–3.3)
LYMPH%: 37.2 % (ref 14.0–49.7)
MCH: 31.8 pg (ref 25.1–34.0)
MCHC: 32.4 g/dL (ref 31.5–36.0)
MCV: 97.9 fL (ref 79.5–101.0)
MONO#: 0.4 10*3/uL (ref 0.1–0.9)
MONO%: 5.7 % (ref 0.0–14.0)
NEUT#: 4.3 10*3/uL (ref 1.5–6.5)
NEUT%: 56.7 % (ref 38.4–76.8)
PLATELETS: 99 10*3/uL — AB (ref 145–400)
RBC: 3.81 10*6/uL (ref 3.70–5.45)
RDW: 18.5 % — ABNORMAL HIGH (ref 11.2–14.5)
RETIC %: 2.98 % — AB (ref 0.70–2.10)
RETIC CT ABS: 113.54 10*3/uL — AB (ref 33.70–90.70)
WBC: 7.5 10*3/uL (ref 3.9–10.3)

## 2015-11-14 LAB — COMPREHENSIVE METABOLIC PANEL
ALT: 25 U/L (ref 0–55)
ANION GAP: 10 meq/L (ref 3–11)
AST: 17 U/L (ref 5–34)
Albumin: 3.2 g/dL — ABNORMAL LOW (ref 3.5–5.0)
Alkaline Phosphatase: 95 U/L (ref 40–150)
BILIRUBIN TOTAL: 0.33 mg/dL (ref 0.20–1.20)
BUN: 16.3 mg/dL (ref 7.0–26.0)
CO2: 26 meq/L (ref 22–29)
CREATININE: 0.7 mg/dL (ref 0.6–1.1)
Calcium: 8.6 mg/dL (ref 8.4–10.4)
Chloride: 103 mEq/L (ref 98–109)
EGFR: 77 mL/min/{1.73_m2} — ABNORMAL LOW (ref >90)
Glucose: 114 mg/dl (ref 70–140)
Potassium: 4.2 mEq/L (ref 3.5–5.1)
Sodium: 139 mEq/L (ref 136–145)
TOTAL PROTEIN: 5.6 g/dL — AB (ref 6.4–8.3)

## 2015-11-14 LAB — LACTATE DEHYDROGENASE: LDH: 261 U/L — ABNORMAL HIGH (ref 125–245)

## 2015-11-14 MED ORDER — SODIUM CHLORIDE 0.9 % IJ SOLN
10.0000 mL | INTRAMUSCULAR | Status: DC | PRN
  Administered 2015-11-14: 10 mL via INTRAVENOUS
  Filled 2015-11-14: qty 10

## 2015-11-14 MED ORDER — HEPARIN SOD (PORK) LOCK FLUSH 100 UNIT/ML IV SOLN
500.0000 [IU] | Freq: Once | INTRAVENOUS | Status: AC
  Administered 2015-11-14: 500 [IU] via INTRAVENOUS
  Filled 2015-11-14: qty 5

## 2015-11-14 MED ORDER — DEXAMETHASONE 4 MG PO TABS: 4.0000 mg | ORAL_TABLET | Freq: Every day | ORAL | Status: DC

## 2015-11-14 MED ORDER — DEXAMETHASONE 4 MG PO TABS: 4.0000 mg | ORAL_TABLET | Freq: Every day | ORAL | Status: AC

## 2015-11-14 MED ORDER — FUROSEMIDE 40 MG PO TABS: 40.0000 mg | ORAL_TABLET | Freq: Every day | ORAL | Status: AC

## 2015-11-14 NOTE — Patient Instructions (Signed)

## 2015-11-25 DIAGNOSIS — Z7189 Other specified counseling: Secondary | ICD-10-CM | POA: Insufficient documentation

## 2015-11-25 NOTE — Progress Notes (Signed)
Kristen Baker  HEMATOLOGY ONCOLOGY PROGRESS NOTE  Date of service: .11/14/2015  Patient Care Team: Brunetta Genera, MD as PCP - General (Hematology)  Diagnosis: Stage IVB Burkitts Lymphoma with leptomeningeal Involvement  Current Treatment:   1. Da EPOCH-R s/p 2 cycles. 2. IT methotrexate + Hydrocortisone qweekly s/p 5 doses   INTERVAL HISTORY:  Mrs. Childs is here for her scheduled followup for her Burkitts lymphoma and to discuss further treatment options and goals of care. She notes that she had an external excellent Christmas and new year with her family and felt better off chemotherapy. Her repeat MRI of the brain showed that her cerebellar lesion had improved and radiation oncology is is not planning to do any brain directed RT and this time. Her LDH levels are increasing again and she noted some increasing cervical and supraclavicular lymphadenopathy.   We talked the different treatment options including continuing dose adjusted EPOCH-R and other potential palliative treatments Since she is unlikely to tolerate treatment doses that might be curative. Systemic chemotherapy will likely keep the disease under control for a while longer but at the expensive potentially affecting her quality of life. Without treatment she is aware that her disease will progress in weeks to couple of months. She notes that she is too fatigued and does not want to go through the rigors of additional chemotherapy and wants to spend time her remaining time at home with family and focus on symptom control.  She has several family members with her for the discussion including her son. She is very certain that this is approaching wants to take and would like to enroll in hospice of [facility]. She notes that she is not overtly depressed and is making this decision out of for clear understanding. Her family notes that this is in keeping with her previously noted wishes.    REVIEW OF SYSTEMS:    10 Point review of  systems of done and is negative except as noted above.  . Past Medical History  Diagnosis Date  . Cancer (HCC)     cervical cancer  . Burkitt's lymphoma ([place]) 08/29/2015  . Brain cancer ([place]) 09/26/15    mRI Brain Rt cerebellum 10x49mm lesion  . Stroke [facility]) 09/26/15 mri    3 subcentimeter foci of both frontal lobes, consistent with acute to subacute samll vessel infarcts  . Allergy     . Past Surgical History  Procedure Laterality Date  . Cholecystectomy    . Tonsillectomy    . Abdominal hysterectomy    . Abdominal surgery      2-3 rd of colon removed    . Social History  Substance Use Topics  . Smoking status: Never Smoker   . Smokeless tobacco: Never Used  . Alcohol Use: No    ALLERGIES:  is allergic to cabbage; codeine; onion; shellfish allergy; sulfa antibiotics; and zofran.  MEDICATIONS:  Reviewed in EPIC.  PHYSICAL EXAMINATION: ECOG PERFORMANCE STATUS: 2 - Symptomatic, <50% confined to bed  . Filed Vitals:   11/14/15 1516  BP: 127/67  Pulse: 74  Temp: 97.8 F (36.6 C)  Resp: 17    Filed Weights   11/14/15 1516  Weight: 163 lb (73.936 kg)  visit  Part  .Body mass index is 27.12 kg/(m^2).  GENERAL:alert,elderly lady in NAD, somewhat fatigued appearing SKIN: no acute skin rashes, chemotherapy associated alopecia noted. EYES: normal, conjunctiva are pink and non-injected, sclera clear OROPHARYNX:no exudate, no erythema and lips,oral mucosa moist NECK: supple, no JVD, thyroid normal  size, non-tender, palpable cervical and right supraclavicular node which appeared new. LYMPH:  no palpable lymphadenopathy in the cervical, axillary or inguinal LUNGS: clear to auscultation with normal respiratory effort HEART: regular rate & rhythm,  no murmurs and no lower extremity edema ABDOMEN: abdomen soft, non-tender, normoactive bowel sounds  Musculoskeletal: no cyanosis of digits and no clubbing  PSYCH: alert & oriented x 3 with fluent speech NEURO: no focal  motor/sensory deficits  LABORATORY DATA:   . CBC Latest Ref Rng 11/14/2015 10/21/2015 09/30/2015  WBC 3.9 - 10.3 10e3/uL 7.5 13.0(H) 67.2(HH)  Hemoglobin 11.6 - 15.9 g/dL 12.1 13.1 11.2(L)  Hematocrit 34.8 - 46.6 % 37.3 40.1 33.3(L)  Platelets 145 - 400 10e3/uL 99(L) 141(L) 81(L)    . CMP Latest Ref Rng 11/14/2015 10/21/2015 09/30/2015  Glucose 70 - 140 mg/dl 114 145(H) 96  BUN 7.0 - 26.0 mg/dL 16.3 16.8 16  Creatinine 0.6 - 1.1 mg/dL 0.7 0.7 0.63  Sodium 136 - 145 mEq/L 139 138 135  Potassium 3.5 - 5.1 mEq/L 4.2 3.9 3.6  Chloride 101 - 111 mmol/L - - 102  CO2 22 - 29 mEq/L 26 28 28   Calcium 8.4 - 10.4 mg/dL 8.6 8.8 7.9(L)  Total Protein 6.4 - 8.3 g/dL 5.6(L) 5.6(L) -  Total Bilirubin 0.20 - 1.20 mg/dL 0.33 0.49 -  Alkaline Phos 40 - 150 U/L 95 81 -  AST 5 - 34 U/L 17 16 -  ALT 0 - 55 U/L 25 27 -   . Lab Results  Component Value Date   LDH 261* 11/14/2015   RADIOGRAPHIC STUDIES: I have personally reviewed the radiological images as listed and agreed with the findings in the report. [name] Wo Contrast MRI brain 10/24/2015  IMPRESSION: Leptomeningeal enhancement continues to improve and has returned to normal. This is most compatible with lymphoma involvement of the meninges which is been treated. Improvement in diffuse bone marrow signal abnormality in the calvarium and cervical spine compatible with treated lymphoma.  Progression of diffuse white matter changes compatible with chemotherapy.  Right cerebellar enhancing lesion no longer enhances and appears represent an area of chronic infarction.  Resolving areas of subacute infarct in the right frontal and left frontal and parietal white matter compared with the prior study.  2.5 mm punctate area of enhancement in the midbrain in the floor of the fourth ventricle has shown slight interval growth since the most recent study. This may represent subacute infarct. Lymphoma is a consideration however given the  improvement in the other imaging findings lymphoma, this is felt more likely related to ischemia. Follow-up MRI suggested.   Electronically Signed  By: Franchot Gallo M.D.  On: 10/24/2015 13:07   Nm Pet Image Restage (ps) Whole Body  10/18/2015  CLINICAL DATA:  Subsequent treatment strategy for Burkitt's lymphoma EXAM: NUCLEAR MEDICINE PET WHOLE BODY TECHNIQUE: 8.5 mCi F-18 FDG was injected intravenously. Full-ring PET imaging was performed from the vertex to the feet after the radiotracer. CT data was obtained and used for attenuation correction and anatomic localization. FASTING BLOOD GLUCOSE:  Value:  103 mg/dl COMPARISON:  08/27/2015 FINDINGS: Head/Neck: The small enhancing focus in the right cerebellum on recent brain MRI does not have a significant CT or PET correlate. There is evidence of chronic microvascular white matter disease on the CT data but no compelling signs of large lesion hypermetabolic or hypometabolic to brain tissue identified. No adenopathy in the neck. Chest: New triangular nodular density anteriorly in the right middle lobe, a 1.0  by 1.0 cm, appears to have some faintly increased metabolic activity with maximum standard uptake value approximately 3.1. Coronary, aortic arch, and branch vessel atherosclerotic vascular disease. There is some stranding in the adipose tissue at the aortic hiatus although the masslike appearance is less notable, and currently there is no significant hypermetabolic activity in this vicinity. Abdomen/Pelvis: Reduced size in density of hematoma inferiorly along the right perirenal fascia, 4.7 by 2.3 cm, formerly 7.5 by 3.2 cm, maximum standard uptake value 4.2 (formerly 4.5). This is significantly less dense than before, and likely represents improving hematoma related to prior biopsy. Lower precaval lymph node short axis diameter 1.0 cm on image 159 series 4, maximum standard uptake value 2.7. Prior presacral mildly hypermetabolic confluent stranding  has resolved. Aortoiliac atherosclerotic vascular disease. Skeleton: Diffuse patchy hypermetabolic activity in the marrow but improved from prior. For example, L3 vertebral body activity was previously 6.5 by my measurement, and currently 4.3. IMPRESSION: 1. Generally significantly improved appearance, with reduced conspicuity and metabolic activity of the right retrocrural tissue at the aortic hiatus ; continued reduction in size and activity of the inferior precaval lymph node ; resolution of the presacral stranding; and improved but not resolved patchy hypermetabolic activity in the bone marrow. 2. There is a new triangular focus anteriorly in the right middle lobe which is slightly hypermetabolic, probably inflammatory, but merits observation. 3. Reduced size and density of the post biopsy hematoma inferiorly along the right perirenal fascia. 4. The small enhancing focus in the right cerebellum shown on the MRI from 09/26/2015 is not readily apparent on PET-CT. Electronically Signed   By: Van Clines M.D.   On: 10/18/2015 10:23    ASSESSMENT & PLAN:   82 yo caucasian female with   1)Stage IVb Burkitts lymphoma with leptomeningeal involvement. On presentation patient had Retroperitoneal and Posterior Mediastinal Lnadenopathy with significantly elevated LDH and leukoerythroblastic picture on peipheral blood smear with multiple large atypical Lymphocytes. Patient had significant type B constitutional symptoms which have now resolved . MRI Brain with evidence of leptomeningeal involvement consistent with the patients symptoms of chin numbness though LP was unrevealing. BM packed with lymphoma LDH down from 3000's to 1900 to 1215 to 500's to 368 to 352 to 311 to 243 to 181 and now slowly rising to 265 S/p 2 cycles of daEPOCH-R + IT Methotrexate (weekly for now - received 5rd dose on 09/20/2015)  PET/CT scan shows treatment response as expected with no evidence of overt disease  progression.  MRI brain with improvement in leptomeningeal enhancement with noted have new rt cerebellum lesion With follow-up MRI of the brain on 10/24/2015 showing resolution of the cerebellar enhancement suggesting that it was not lymphoma but is CVA or resolved lymphoma  2) pancytopenia due to chemotherapy improved. Noted to have thrombocytopenia related to her Burkitt's lymphoma with platelet counts of 99k 3)significant fatigue likely from chemotherapy.Improved with time off chemotherapy.   Plan -time was spent with the patient and her family to define her goals of care. -We talked about the current status of her disease, treatment options that are likely to potentially keep her lymphoma B for a while longer breath extensive chemotherapy side effects. We discussed that it is less likely that she will tolerate high doses of chemotherapy to achieve complete remission. Various chemotherapy options were discussed. -Patient is absolutely clear that she would like to spend her time with family at home and not focused on being in the hospital for treatments and does not want to  undergo side effects of chemotherapy even if that meant earlier disease progression and demise. -She had a good Christmas and new year with family. Notes persistent fatigue. -Chooses to be enrolled with hospice services. Referral for home hospice services was provided to [facility] as per patient's preference. -I reassured the patient that we shall be available to her to answer any questions or take care of any other uncontrolled symptoms that she might have. -She currently appears to have reasonable quality of life with no other uncontrolled symptoms. -She has dexamethasone which she can use for fever/chills or night sweats and temporarily at higher doses for control of her lymphadenopathy if needed.  I spent 35 minutes counseling the patient face to face in discussing goals of care with the patient and her whole  family. The total time spent in the appointment was 46 minutes   Sullivan Lone MD [place] AAHIVMS [facility] Hematology/Oncology Physician [facility]  (Office):       [phone] (Work cell):  [phone] (Fax):           [phone]

## 2016-02-02 IMAGING — PT NM PET IMAGE RESTAGE (PS) WHOLE BODY
7 series · 25 of 25 positions shown · non-contrast
Comparison: 08/27/2015

CLINICAL DATA: Subsequent treatment strategy for [place] lymphoma

EXAM:
NUCLEAR MEDICINE PET WHOLE BODY
TECHNIQUE: 8.5 mCi F-18 FDG was injected intravenously. Full-ring PET imaging
was performed from the vertex to the feet after the radiotracer. CT
data was obtained and used for attenuation correction and anatomic
localization.
FASTING BLOOD GLUCOSE:  Value:  103 mg/dl

[Series 3: pet sk_thigh ac · axial · 5.0mm · 4.07mm/px · z∈[-1158,-250]mm · 5 of 228 slices shown]
[im 1/228]
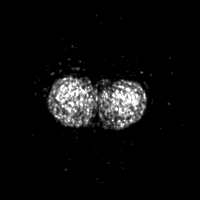
[im 57/228]
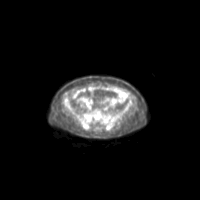
[im 114/228]
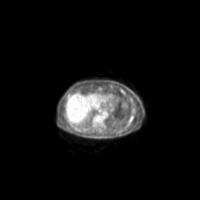
[im 171/228]
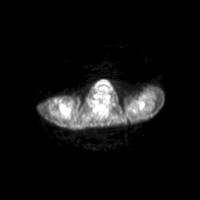
[im 228/228]
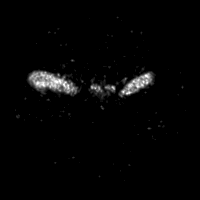

[Series 4: ct sk_thigh 5.0 b31f · axial · 5.0mm · 0.98mm/px · z∈[-1158,-250]mm · 5 of 227 slices shown]
[im 1/227  soft-tissue]
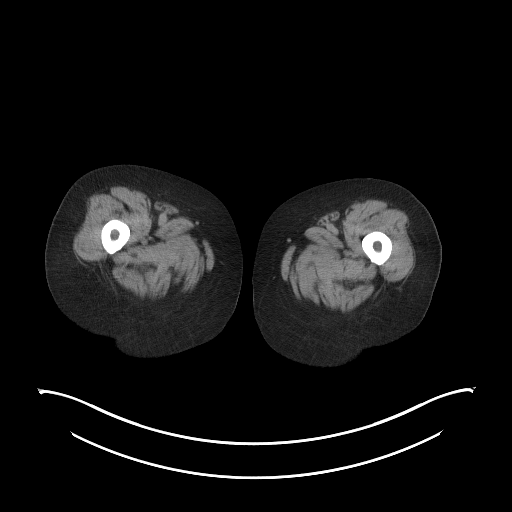
[im 57/227  soft-tissue]
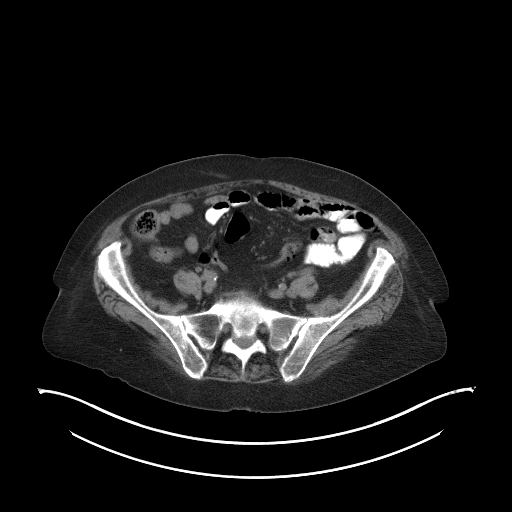
[im 114/227  soft-tissue]
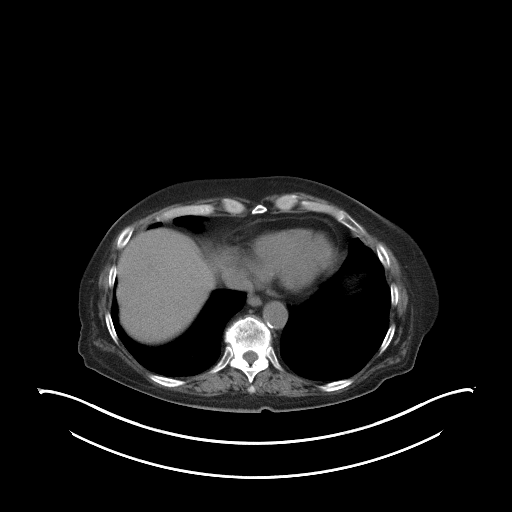
[im 170/227  soft-tissue]
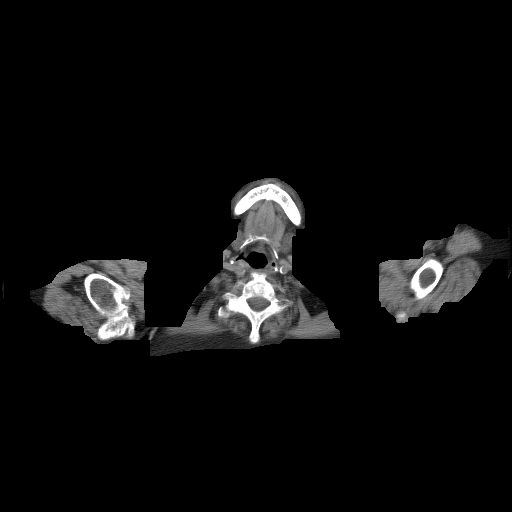
[im 227/227  soft-tissue]
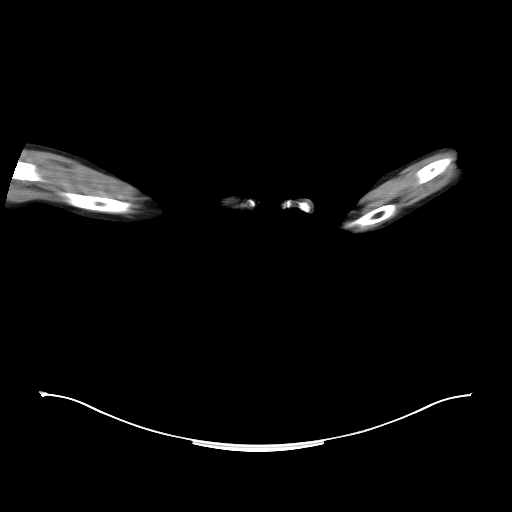

[Series 7: pet sk_thigh nac · axial · 5.0mm · 4.07mm/px · z∈[-1158,-250]mm · 5 of 228 slices shown]
[im 1/228]
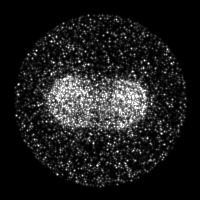
[im 57/228]
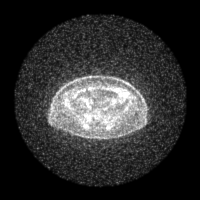
[im 114/228]
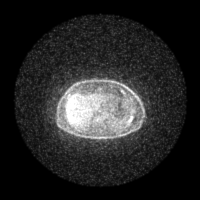
[im 171/228]
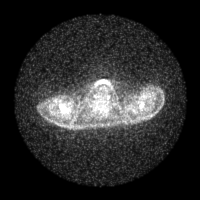
[im 228/228]
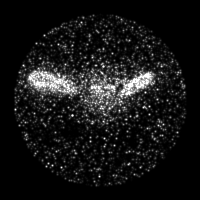

[Series 8: ct sk_thigh 5.0 b70f (id)_bone · axial · 5.0mm · 0.75mm/px · z∈[-758,-498]mm · 2 of 66 slices shown]
[im 1/66  soft-tissue]
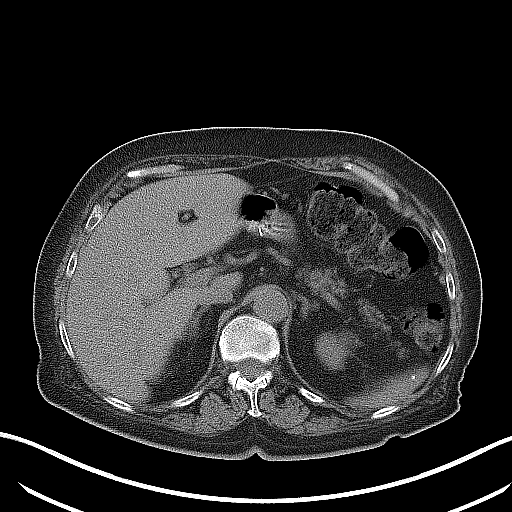
[im 66/66  soft-tissue]
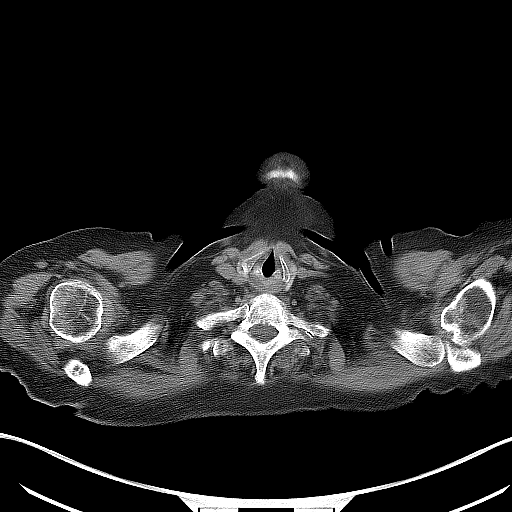

[Series 604: mip collection<mip range> · coronal · 1.88mm/px · 1 of 32 slices shown]
[im 1/32]
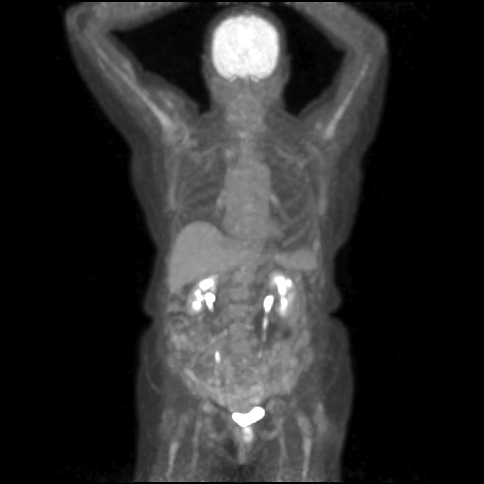

[Series 605: range-ct sk_thigh 5.0 (id)<alpha range> · 2 of 76 slices shown (1 of 2)]
[im 1/76]
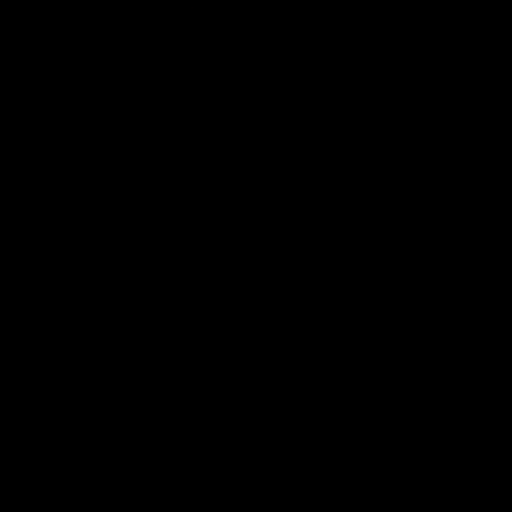
[im 76/76]
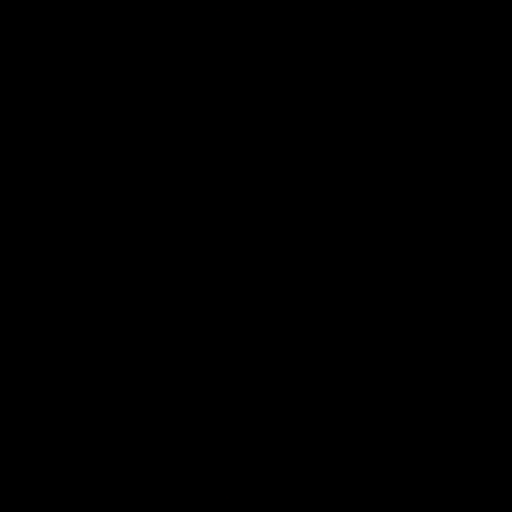

[Series 606: range-ct sk_thigh 5.0 (id)<alpha range> · 5 of 212 slices shown (2 of 2)]
[im 1/212]
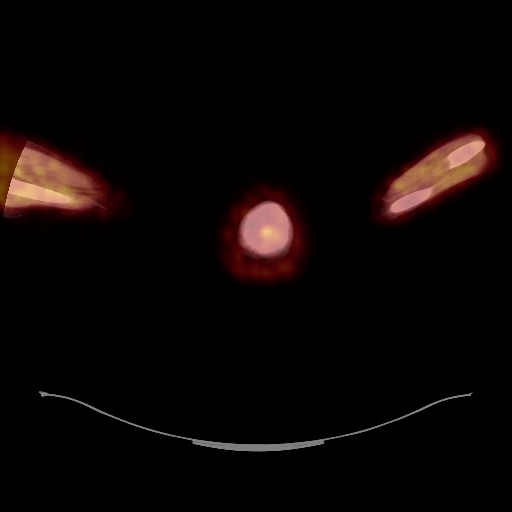
[im 53/212]
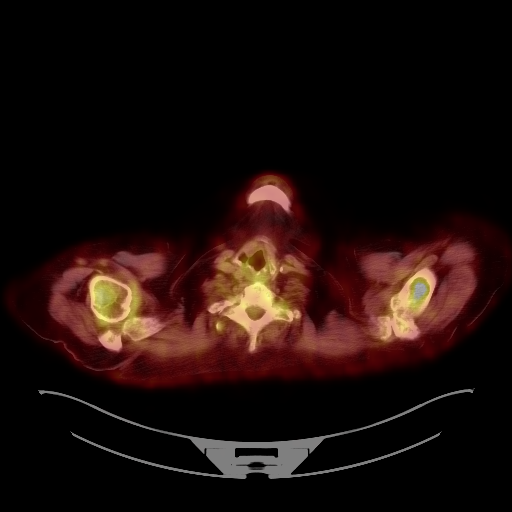
[im 106/212]
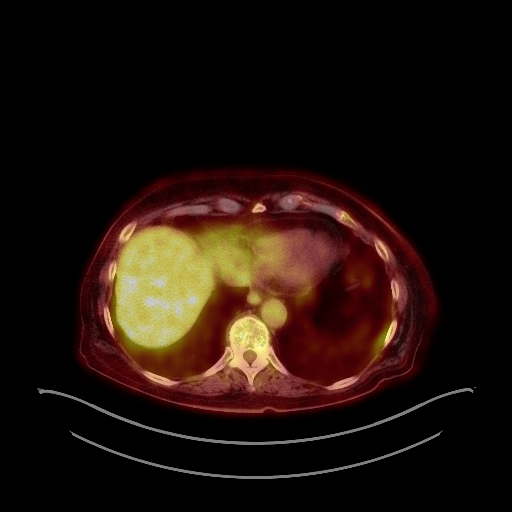
[im 159/212]
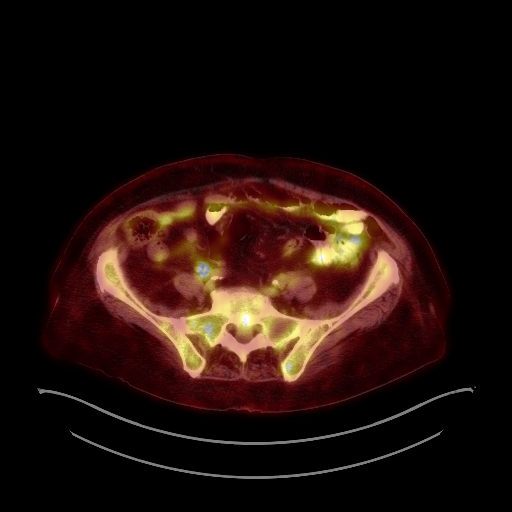
[im 212/212]
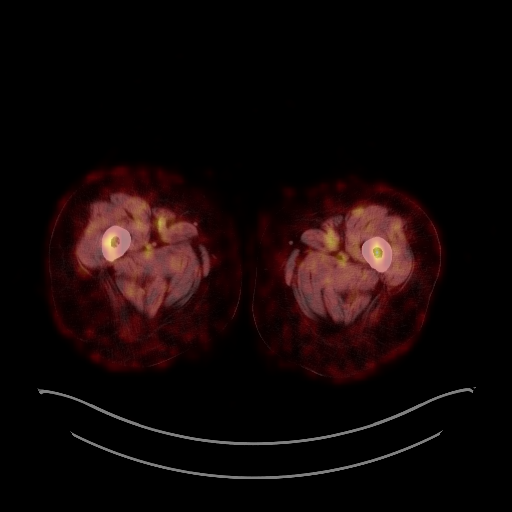

[25 of 25 positions shown; findings below may reference images not displayed]

FINDINGS: Head/Neck: The small enhancing focus in the right cerebellum on
recent brain MRI does not have a significant CT or PET correlate.
There is evidence of chronic microvascular white matter disease on
the CT data but no compelling signs of large lesion hypermetabolic
or hypometabolic to brain tissue identified.

No adenopathy in the neck.

Chest: New triangular nodular density anteriorly in the right middle
lobe, a 1.0 by 1.0 cm, appears to have some faintly increased
metabolic activity with maximum standard uptake value approximately
3.1.

Coronary, aortic arch, and branch vessel atherosclerotic vascular
disease. There is some stranding in the adipose tissue at the aortic
hiatus although the masslike appearance is less notable, and
currently there is no significant hypermetabolic activity in this
vicinity.

Abdomen/Pelvis: Reduced size in density of hematoma inferiorly along
the right perirenal fascia, 4.7 by 2.3 cm, formerly 7.5 by 3.2 cm,
maximum standard uptake value 4.2 (formerly 4.5). This is
significantly less dense than before, and likely represents
improving hematoma related to prior biopsy.

Lower precaval lymph node short axis diameter 1.0 cm on image 159
series 4, maximum standard uptake value 2.7.

Prior presacral mildly hypermetabolic confluent stranding has
resolved. Aortoiliac atherosclerotic vascular disease.

Skeleton: Diffuse patchy hypermetabolic activity in the marrow but
improved from prior. For example, L3 vertebral body activity was
previously 6.5 by my measurement, and currently 4.3.
IMPRESSION: 1. Generally significantly improved appearance, with reduced
conspicuity and metabolic activity of the right retrocrural tissue
at the aortic hiatus ; continued reduction in size and activity of
the inferior precaval lymph node ; resolution of the presacral
stranding; and improved but not resolved patchy hypermetabolic
activity in the bone marrow.
2. There is a new triangular focus anteriorly in the right middle
lobe which is slightly hypermetabolic, probably inflammatory, but
merits observation.
3. Reduced size and density of the post biopsy hematoma inferiorly
along the right perirenal fascia.
4. The small enhancing focus in the right cerebellum shown on the
MRI from 09/26/2015 is not readily apparent on PET-CT.

## 2016-09-09 DEATH — deceased

## 2016-11-28 ENCOUNTER — Other Ambulatory Visit: Payer: Self-pay | Admitting: Nurse Practitioner
